# Patient Record
Sex: Male | Born: 1955 | Race: White | Hispanic: No | Marital: Married | Smoking: Former smoker
Health system: Southern US, Community
[De-identification: ages and names within clinical notes are randomized; demographics above are authoritative.]

## PROBLEM LIST (undated history)

## (undated) DIAGNOSIS — E7800 Pure hypercholesterolemia, unspecified: Secondary | ICD-10-CM

## (undated) DIAGNOSIS — K279 Peptic ulcer, site unspecified, unspecified as acute or chronic, without hemorrhage or perforation: Secondary | ICD-10-CM

## (undated) DIAGNOSIS — R51 Headache: Secondary | ICD-10-CM

## (undated) DIAGNOSIS — R011 Cardiac murmur, unspecified: Secondary | ICD-10-CM

## (undated) DIAGNOSIS — R7309 Other abnormal glucose: Secondary | ICD-10-CM

## (undated) DIAGNOSIS — M549 Dorsalgia, unspecified: Secondary | ICD-10-CM

## (undated) DIAGNOSIS — N529 Male erectile dysfunction, unspecified: Secondary | ICD-10-CM

## (undated) DIAGNOSIS — K219 Gastro-esophageal reflux disease without esophagitis: Secondary | ICD-10-CM

## (undated) DIAGNOSIS — Z87442 Personal history of urinary calculi: Secondary | ICD-10-CM

## (undated) DIAGNOSIS — R519 Headache, unspecified: Secondary | ICD-10-CM

## (undated) DIAGNOSIS — K7581 Nonalcoholic steatohepatitis (NASH): Secondary | ICD-10-CM

## (undated) DIAGNOSIS — G43909 Migraine, unspecified, not intractable, without status migrainosus: Secondary | ICD-10-CM

## (undated) DIAGNOSIS — G4730 Sleep apnea, unspecified: Secondary | ICD-10-CM

## (undated) DIAGNOSIS — I10 Essential (primary) hypertension: Secondary | ICD-10-CM

## (undated) DIAGNOSIS — J309 Allergic rhinitis, unspecified: Secondary | ICD-10-CM

## (undated) DIAGNOSIS — K7689 Other specified diseases of liver: Secondary | ICD-10-CM

## (undated) DIAGNOSIS — E291 Testicular hypofunction: Secondary | ICD-10-CM

## (undated) HISTORY — DX: Essential (primary) hypertension: I10

## (undated) HISTORY — DX: Sleep apnea, unspecified: G47.30

## (undated) HISTORY — DX: Gastro-esophageal reflux disease without esophagitis: K21.9

## (undated) HISTORY — DX: Nonalcoholic steatohepatitis (NASH): K75.81

## (undated) HISTORY — DX: Headache, unspecified: R51.9

## (undated) HISTORY — DX: Peptic ulcer, site unspecified, unspecified as acute or chronic, without hemorrhage or perforation: K27.9

## (undated) HISTORY — DX: Cardiac murmur, unspecified: R01.1

## (undated) HISTORY — DX: Other abnormal glucose: R73.09

## (undated) HISTORY — DX: Migraine, unspecified, not intractable, without status migrainosus: G43.909

## (undated) HISTORY — DX: Testicular hypofunction: E29.1

## (undated) HISTORY — DX: Other specified diseases of liver: K76.89

## (undated) HISTORY — PX: VASECTOMY: SHX75

## (undated) HISTORY — DX: Headache: R51

## (undated) HISTORY — DX: Dorsalgia, unspecified: M54.9

## (undated) HISTORY — PX: KNEE SURGERY: SHX244

## (undated) HISTORY — DX: Male erectile dysfunction, unspecified: N52.9

## (undated) HISTORY — DX: Allergic rhinitis, unspecified: J30.9

## (undated) HISTORY — PX: COLONOSCOPY: SHX174

---

## 2004-01-31 ENCOUNTER — Ambulatory Visit: Payer: Self-pay | Admitting: Endocrinology

## 2004-02-05 ENCOUNTER — Ambulatory Visit: Payer: Self-pay | Admitting: Endocrinology

## 2004-03-18 ENCOUNTER — Ambulatory Visit: Payer: Self-pay | Admitting: Cardiology

## 2004-06-11 ENCOUNTER — Ambulatory Visit: Payer: Self-pay | Admitting: Endocrinology

## 2004-06-30 ENCOUNTER — Ambulatory Visit: Payer: Self-pay | Admitting: Internal Medicine

## 2005-01-13 ENCOUNTER — Emergency Department (HOSPITAL_COMMUNITY): Admission: EM | Admit: 2005-01-13 | Discharge: 2005-01-13 | Payer: Self-pay | Admitting: Emergency Medicine

## 2005-01-21 ENCOUNTER — Ambulatory Visit: Payer: Self-pay | Admitting: Endocrinology

## 2005-01-26 ENCOUNTER — Ambulatory Visit: Payer: Self-pay | Admitting: Endocrinology

## 2005-03-06 ENCOUNTER — Encounter: Admission: RE | Admit: 2005-03-06 | Discharge: 2005-03-06 | Payer: Self-pay | Admitting: *Deleted

## 2005-06-22 ENCOUNTER — Ambulatory Visit: Payer: Self-pay | Admitting: Endocrinology

## 2005-07-01 ENCOUNTER — Ambulatory Visit: Payer: Self-pay | Admitting: Cardiology

## 2006-02-21 ENCOUNTER — Ambulatory Visit: Payer: Self-pay | Admitting: Endocrinology

## 2006-02-21 HISTORY — PX: ELECTROCARDIOGRAM: SHX264

## 2006-09-06 ENCOUNTER — Ambulatory Visit: Payer: Self-pay | Admitting: Gastroenterology

## 2006-09-19 ENCOUNTER — Ambulatory Visit: Payer: Self-pay | Admitting: Gastroenterology

## 2006-09-19 ENCOUNTER — Encounter: Payer: Self-pay | Admitting: Gastroenterology

## 2006-11-23 ENCOUNTER — Encounter: Payer: Self-pay | Admitting: Endocrinology

## 2006-11-23 DIAGNOSIS — I10 Essential (primary) hypertension: Secondary | ICD-10-CM | POA: Insufficient documentation

## 2006-11-23 DIAGNOSIS — J309 Allergic rhinitis, unspecified: Secondary | ICD-10-CM | POA: Insufficient documentation

## 2006-11-23 HISTORY — DX: Allergic rhinitis, unspecified: J30.9

## 2006-11-23 HISTORY — DX: Essential (primary) hypertension: I10

## 2007-04-03 ENCOUNTER — Ambulatory Visit: Payer: Self-pay | Admitting: Endocrinology

## 2007-04-03 LAB — CONVERTED CEMR LAB
Basophils Relative: 0.9 % (ref 0.0–1.0)
Bilirubin Urine: NEGATIVE
Bilirubin, Direct: 0.2 mg/dL (ref 0.0–0.3)
CO2: 31 meq/L (ref 19–32)
Calcium: 9.1 mg/dL (ref 8.4–10.5)
Cholesterol: 133 mg/dL (ref 0–200)
Eosinophils Absolute: 0.1 10*3/uL (ref 0.0–0.6)
Eosinophils Relative: 2.1 % (ref 0.0–5.0)
GFR calc Af Amer: 82 mL/min
GFR calc non Af Amer: 68 mL/min
Glucose, Bld: 113 mg/dL — ABNORMAL HIGH (ref 70–99)
Hemoglobin: 16.2 g/dL (ref 13.0–17.0)
Leukocytes, UA: NEGATIVE
Lymphocytes Relative: 38.3 % (ref 12.0–46.0)
MCV: 87.1 fL (ref 78.0–100.0)
Monocytes Absolute: 0.6 10*3/uL (ref 0.2–0.7)
Neutro Abs: 3.2 10*3/uL (ref 1.4–7.7)
Neutrophils Relative %: 49.9 % (ref 43.0–77.0)
Nitrite: NEGATIVE
PSA: 0.29 ng/mL (ref 0.10–4.00)
Platelets: 209 10*3/uL (ref 150–400)
Potassium: 4.1 meq/L (ref 3.5–5.1)
Sodium: 142 meq/L (ref 135–145)
TSH: 2.3 microintl units/mL (ref 0.35–5.50)
Total CHOL/HDL Ratio: 4.3
Total Protein: 6.5 g/dL (ref 6.0–8.3)
Triglycerides: 133 mg/dL (ref 0–149)
Urine Glucose: NEGATIVE mg/dL
WBC: 6.5 10*3/uL (ref 4.5–10.5)

## 2007-04-04 ENCOUNTER — Ambulatory Visit: Payer: Self-pay | Admitting: Endocrinology

## 2007-04-04 DIAGNOSIS — K219 Gastro-esophageal reflux disease without esophagitis: Secondary | ICD-10-CM | POA: Insufficient documentation

## 2007-04-04 DIAGNOSIS — R7309 Other abnormal glucose: Secondary | ICD-10-CM

## 2007-04-04 DIAGNOSIS — E119 Type 2 diabetes mellitus without complications: Secondary | ICD-10-CM | POA: Insufficient documentation

## 2007-04-04 DIAGNOSIS — N529 Male erectile dysfunction, unspecified: Secondary | ICD-10-CM

## 2007-04-04 HISTORY — DX: Gastro-esophageal reflux disease without esophagitis: K21.9

## 2007-04-04 HISTORY — DX: Male erectile dysfunction, unspecified: N52.9

## 2007-04-04 HISTORY — DX: Other abnormal glucose: R73.09

## 2007-05-19 ENCOUNTER — Ambulatory Visit: Payer: Self-pay | Admitting: Endocrinology

## 2007-05-19 DIAGNOSIS — E291 Testicular hypofunction: Secondary | ICD-10-CM

## 2007-05-19 HISTORY — DX: Testicular hypofunction: E29.1

## 2007-05-21 LAB — CONVERTED CEMR LAB: Prolactin: 11.2 ng/mL

## 2007-06-03 ENCOUNTER — Encounter: Admission: RE | Admit: 2007-06-03 | Discharge: 2007-06-03 | Payer: Self-pay | Admitting: Endocrinology

## 2007-06-29 ENCOUNTER — Encounter: Payer: Self-pay | Admitting: Endocrinology

## 2008-04-19 ENCOUNTER — Ambulatory Visit: Payer: Self-pay | Admitting: Endocrinology

## 2008-04-20 LAB — CONVERTED CEMR LAB
ALT: 32 units/L (ref 0–53)
AST: 21 units/L (ref 0–37)
Alkaline Phosphatase: 69 units/L (ref 39–117)
Basophils Absolute: 0 10*3/uL (ref 0.0–0.1)
CO2: 29 meq/L (ref 19–32)
Chloride: 106 meq/L (ref 96–112)
Cholesterol: 108 mg/dL (ref 0–200)
Eosinophils Absolute: 0.1 10*3/uL (ref 0.0–0.7)
Eosinophils Relative: 1 % (ref 0.0–5.0)
GFR calc non Af Amer: 68 mL/min
Hemoglobin, Urine: NEGATIVE
LDL Cholesterol: 54 mg/dL (ref 0–99)
Leukocytes, UA: NEGATIVE
Lymphocytes Relative: 22.8 % (ref 12.0–46.0)
MCHC: 35.1 g/dL (ref 30.0–36.0)
MCV: 87.1 fL (ref 78.0–100.0)
Neutrophils Relative %: 70.7 % (ref 43.0–77.0)
Platelets: 183 10*3/uL (ref 150–400)
Potassium: 4.2 meq/L (ref 3.5–5.1)
RBC: 5.36 M/uL (ref 4.22–5.81)
Specific Gravity, Urine: 1.015 (ref 1.000–1.03)
Total Bilirubin: 1.3 mg/dL — ABNORMAL HIGH (ref 0.3–1.2)
Total CHOL/HDL Ratio: 3.4
Urine Glucose: NEGATIVE mg/dL
Urobilinogen, UA: 0.2 (ref 0.0–1.0)
VLDL: 22 mg/dL (ref 0–40)
WBC: 9 10*3/uL (ref 4.5–10.5)

## 2008-05-02 ENCOUNTER — Telehealth (INDEPENDENT_AMBULATORY_CARE_PROVIDER_SITE_OTHER): Payer: Self-pay | Admitting: *Deleted

## 2008-05-09 ENCOUNTER — Ambulatory Visit: Payer: Self-pay | Admitting: Endocrinology

## 2008-05-09 DIAGNOSIS — K7689 Other specified diseases of liver: Secondary | ICD-10-CM

## 2008-05-09 DIAGNOSIS — K760 Fatty (change of) liver, not elsewhere classified: Secondary | ICD-10-CM | POA: Insufficient documentation

## 2008-05-09 HISTORY — DX: Other specified diseases of liver: K76.89

## 2008-06-13 ENCOUNTER — Telehealth (INDEPENDENT_AMBULATORY_CARE_PROVIDER_SITE_OTHER): Payer: Self-pay | Admitting: *Deleted

## 2008-09-06 ENCOUNTER — Telehealth: Payer: Self-pay | Admitting: Endocrinology

## 2009-06-09 ENCOUNTER — Ambulatory Visit: Payer: Self-pay | Admitting: Endocrinology

## 2009-06-09 LAB — CONVERTED CEMR LAB
AST: 27 units/L (ref 0–37)
Alkaline Phosphatase: 70 units/L (ref 39–117)
Basophils Relative: 0.3 % (ref 0.0–3.0)
Bilirubin, Direct: 0.2 mg/dL (ref 0.0–0.3)
CO2: 31 meq/L (ref 19–32)
Calcium: 9.6 mg/dL (ref 8.4–10.5)
Creatinine, Ser: 1.2 mg/dL (ref 0.4–1.5)
Eosinophils Absolute: 0.2 10*3/uL (ref 0.0–0.7)
GFR calc non Af Amer: 67.12 mL/min (ref >60)
HDL: 53.2 mg/dL (ref >39.00)
Hemoglobin, Urine: NEGATIVE
LDL Cholesterol: 70 mg/dL (ref 0–99)
Lymphocytes Relative: 32 % (ref 12.0–46.0)
MCHC: 33 g/dL (ref 30.0–36.0)
Monocytes Relative: 8.1 % (ref 3.0–12.0)
Neutrophils Relative %: 57.5 % (ref 43.0–77.0)
PSA: 0.37 ng/mL (ref 0.10–4.00)
RBC: 5.53 M/uL (ref 4.22–5.81)
Sodium: 145 meq/L (ref 135–145)
Specific Gravity, Urine: >=1.03 (ref 1.000–1.030)
Total CHOL/HDL Ratio: 3
Total Protein, Urine: NEGATIVE mg/dL
Total Protein: 7 g/dL (ref 6.0–8.3)
Triglycerides: 110 mg/dL (ref 0.0–149.0)
Urine Glucose: NEGATIVE mg/dL
VLDL: 22 mg/dL (ref 0.0–40.0)
WBC: 7.8 10*3/uL (ref 4.5–10.5)

## 2009-06-13 ENCOUNTER — Ambulatory Visit: Payer: Self-pay | Admitting: Endocrinology

## 2010-03-22 ENCOUNTER — Emergency Department (HOSPITAL_COMMUNITY)
Admission: EM | Admit: 2010-03-22 | Discharge: 2010-03-22 | Payer: Self-pay | Source: Home / Self Care | Admitting: Emergency Medicine

## 2010-04-01 ENCOUNTER — Ambulatory Visit
Admission: RE | Admit: 2010-04-01 | Discharge: 2010-04-01 | Payer: Self-pay | Source: Home / Self Care | Attending: Endocrinology | Admitting: Endocrinology

## 2010-04-01 DIAGNOSIS — J0120 Acute ethmoidal sinusitis, unspecified: Secondary | ICD-10-CM | POA: Insufficient documentation

## 2010-04-14 ENCOUNTER — Encounter: Payer: Self-pay | Admitting: Endocrinology

## 2010-04-26 LAB — CONVERTED CEMR LAB: Testosterone: 265.67 ng/dL — ABNORMAL LOW (ref 350.00–890)

## 2010-04-28 NOTE — Assessment & Plan Note (Signed)
Summary: PHYSICAL---STC   Vital Signs:  Patient profile:   55 year old male Height:      68.5 inches (173.99 cm) Weight:      230 pounds (104.55 kg) BMI:     34.59 O2 Sat:      96 % on Room air Temp:     97.1 degrees F (36.17 degrees C) oral Pulse rate:   62 / minute BP sitting:   128 / 80  (left arm) Cuff size:   large  Vitals Entered By: Josph Macho RMA (June 13, 2009 8:17 AM)  O2 Flow:  Room air CC: Physical/ CF   CC:  Physical/ CF.  History of Present Illness: here for regular wellness examination.  He's feeling pretty well in general, and does not smoke.  alcohol is rare.   Current Medications (verified): 1)  Mevacor 40 Mg  Tabs (Lovastatin) .... Take 2 By Mouth Qhs 2)  Adult Aspirin Low Strength 81 Mg  Tbdp (Aspirin) .... Take 1 By Mouth Qd 3)  Hyzaar 50-12.5 Mg  Tabs (Losartan Potassium-Hctz) .... Take 1 By Mouth Qd 4)  Midrin 325-65-100 Mg  Caps (Apap-Isometheptene-Dichloral) .... Take 1-2 Prn 5)  Voltaren 75 Mg  Tbec (Diclofenac Sodium) .... Take 1 By Mouth Two Times A Day Qd 6)  Cialis 20 Mg  Tabs (Tadalafil) .... For As Needed Use 7)  Prilosec 40 Mg  Cpdr (Omeprazole) .... Qd 8)  Clomid 50 Mg  Tabs (Clomiphene Citrate) .... 1/4 Tab Qd  Allergies (verified): No Known Drug Allergies  Past History:  Past Medical History: Last updated: 11/23/2006 Allergic rhinitis Hypertension Ulcers/PUD Migraines NASH  Family History: Reviewed history from 04/04/2007 and no changes required. 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         he has slight depression due to family  matters  Physical Exam  General:  obese.   Head:  head: no deformity eyes: no periorbital swelling, no proptosis external nose and ears are normal mouth: no lesion seen Neck:  Supple without thyroid enlargement or tenderness.  Lungs:  Clear to auscultation bilaterally. Normal respiratory effort.  Heart:  Regular rate and rhythm without murmurs or gallops noted. Normal S1,S2.   Abdomen:  abdomen is soft, nontender.  no hepatosplenomegaly.   not distended.  no hernia  Rectal:  normal external and internal exam.  heme neg  Prostate:  Normal size prostate without masses or tenderness.  Msk:  muscle bulk and strength are grossly normal.  no obvious joint swelling.  gait is normal and steady  Pulses:  dorsalis pedis intact bilat.  no carotid bruit  Extremities:  no deformity.  no ulcer on the feet.  feet are of normal color and temp.  no edema  Neurologic:  cn 2-12 grossly intact.   readily moves all 4's.   sensation is intact to touch on the feet  Skin:  normal texture and temp.  no rash.  not diaphoretic  Cervical Nodes:  No significant adenopathy.  Psych:  Alert and cooperative; normal mood and affect; normal attention span and concentration.     Impression & Recommendations:  Problem # 1:  ROUTINE GENERAL MEDICAL EXAM@HEALTH  CARE FACL (  ICD-V70.0)  Other Orders: EKG w/ Interpretation (93000) TLB-A1C / Hgb A1C (Glycohemoglobin) (83036-A1C) TLB-Testosterone, Total (84403-TESTO) Est. Patient 40-64 years (16109)   Patient Instructions: 1)  tests are being ordered for you today.  a few days after the test(s), please call [phone] to hear your test results. 2)  here are some samples of benicar-hct 20/12.5, to take 1 per day, instead of hyzaar.  when you run out of these, resume hyzaar. 3)  Please schedule a follow-up appointment in 1 year. 4)  we discussed the recommendations of the preventive services task force 5)  (pt probably needs to resume clomid)

## 2010-04-30 NOTE — Consult Note (Signed)
Summary: [facility]  [facility]   Imported By: Lennie Odor 04/17/2010 15:13:18  _____________________________________________________________________  External Attachment:    Type:   Image     Comment:   External Document

## 2010-04-30 NOTE — Assessment & Plan Note (Signed)
Summary: POST HOSP/NWS   Vital Signs:  Patient profile:   55 year old male Height:      68.5 inches (173.99 cm) Weight:      232.13 pounds (105.51 kg) BMI:     34.91 O2 Sat:      92 % on Room air Temp:     98.7 degrees F (37.06 degrees C) oral Pulse rate:   92 / minute BP sitting:   110 / 68  (left arm) Cuff size:   large  Vitals Entered By: Brenton Grills CMA Duncan Dull) (April 01, 2010 11:17 AM)  O2 Flow:  Room air CC: [facility] F/U/referral to ENT/aj Is Patient Diabetic? No Comments Pt is no longer taking Midrin, Prilosec, Clomid, or Voltaren   CC:  Post Hospital F/U/referral to ENT/aj.  History of Present Illness: pt was seen in er last week for sinusitis, and vomiting due to narcotic.  maxillary pain is improved, but intermittently recurs.    Current Medications (verified): 1)  Mevacor 40 Mg  Tabs (Lovastatin) .... Take 2 By Mouth Qhs 2)  Adult Aspirin Low Strength 81 Mg  Tbdp (Aspirin) .... Take 1 By Mouth Qd 3)  Hyzaar 50-12.5 Mg  Tabs (Losartan Potassium-Hctz) .... Take 1 By Mouth Qd 4)  Midrin 325-65-100 Mg  Caps (Apap-Isometheptene-Dichloral) .... Take 1-2 Prn 5)  Voltaren 75 Mg  Tbec (Diclofenac Sodium) .... Take 1 By Mouth Two Times A Day Qd 6)  Cialis 20 Mg  Tabs (Tadalafil) .... For As Needed Use 7)  Prilosec 40 Mg  Cpdr (Omeprazole) .... Qd 8)  Clomid 50 Mg  Tabs (Clomiphene Citrate) .... 1/4 Tab Qd  Allergies: No Known Drug Allergies  Past History:  Past Medical History: Last updated: 11/23/2006 Allergic rhinitis Hypertension Ulcers/PUD Migraines NASH  Physical Exam  Head:  head: no deformity eyes: no periorbital swelling, no proptosis external nose and ears are normal mouth: no lesion seen Ears:  TM's intact and clear with normal canals with grossly normal hearing.     Impression & Recommendations:  Problem # 1:  ACUTE ETHMOIDAL SINUSITIS (ICD-461.2) Assessment Unchanged  Problem # 2:  HYPERTENSION  (ICD-401.9) well-controlled  Other Orders: ENT Referral (ENT) Est. Patient Level III (62952)  Patient Instructions: 1)  refer to ear/nose/throat specialist.  you will be called with a day and time for an appointment. 2)  same medication for blood pressure.   Orders Added: 1)  ENT Referral [ENT] 2)  Est. Patient Level III [84132]

## 2010-06-08 LAB — CBC
HCT: 44.9 % (ref 39.0–52.0)
Hemoglobin: 15.8 g/dL (ref 13.0–17.0)
WBC: 9.8 10*3/uL (ref 4.0–10.5)

## 2010-06-08 LAB — CK TOTAL AND CKMB (NOT AT ARMC)
CK, MB: 3.3 ng/mL (ref 0.3–4.0)
Total CK: 160 U/L (ref 7–232)

## 2010-06-08 LAB — DIFFERENTIAL
Basophils Relative: 0 % (ref 0–1)
Eosinophils Absolute: 0.1 10*3/uL (ref 0.0–0.7)
Monocytes Relative: 5 % (ref 3–12)
Neutrophils Relative %: 83 % — ABNORMAL HIGH (ref 43–77)

## 2010-06-08 LAB — COMPREHENSIVE METABOLIC PANEL
ALT: 30 U/L (ref 0–53)
Alkaline Phosphatase: 67 U/L (ref 39–117)
CO2: 28 mEq/L (ref 19–32)
GFR calc non Af Amer: >60 mL/min (ref >60)
Glucose, Bld: 110 mg/dL — ABNORMAL HIGH (ref 70–99)
Potassium: 3.7 mEq/L (ref 3.5–5.1)
Sodium: 140 mEq/L (ref 135–145)

## 2010-06-08 LAB — POCT CARDIAC MARKERS: Myoglobin, poc: 151 ng/mL (ref 12–200)

## 2010-09-14 ENCOUNTER — Other Ambulatory Visit: Payer: Self-pay | Admitting: Endocrinology

## 2010-11-13 ENCOUNTER — Emergency Department (HOSPITAL_COMMUNITY)
Admission: EM | Admit: 2010-11-13 | Discharge: 2010-11-13 | Disposition: A | Discharge status: Home / Self Care | Payer: 59 | Attending: Emergency Medicine | Admitting: Emergency Medicine

## 2010-11-13 ENCOUNTER — Emergency Department (HOSPITAL_COMMUNITY): Payer: 59

## 2010-11-13 DIAGNOSIS — IMO0002 Reserved for concepts with insufficient information to code with codable children: Secondary | ICD-10-CM | POA: Insufficient documentation

## 2010-11-13 DIAGNOSIS — M25469 Effusion, unspecified knee: Secondary | ICD-10-CM | POA: Insufficient documentation

## 2010-11-13 DIAGNOSIS — I10 Essential (primary) hypertension: Secondary | ICD-10-CM | POA: Insufficient documentation

## 2010-11-13 DIAGNOSIS — M25569 Pain in unspecified knee: Secondary | ICD-10-CM | POA: Insufficient documentation

## 2010-11-13 DIAGNOSIS — X500XXA Overexertion from strenuous movement or load, initial encounter: Secondary | ICD-10-CM | POA: Insufficient documentation

## 2010-11-13 DIAGNOSIS — E785 Hyperlipidemia, unspecified: Secondary | ICD-10-CM | POA: Insufficient documentation

## 2010-11-13 DIAGNOSIS — Z79899 Other long term (current) drug therapy: Secondary | ICD-10-CM | POA: Insufficient documentation

## 2010-11-13 DIAGNOSIS — Y9302 Activity, running: Secondary | ICD-10-CM | POA: Insufficient documentation

## 2010-11-19 ENCOUNTER — Other Ambulatory Visit: Payer: Self-pay | Admitting: Endocrinology

## 2011-01-15 ENCOUNTER — Other Ambulatory Visit: Payer: Self-pay

## 2011-01-15 MED ORDER — TADALAFIL 10 MG PO TABS: 10.0000 mg | ORAL_TABLET | Freq: Every day | ORAL | Status: DC | PRN

## 2011-01-15 NOTE — Telephone Encounter (Signed)
Pt's spouse aware.

## 2011-01-29 ENCOUNTER — Ambulatory Visit (INDEPENDENT_AMBULATORY_CARE_PROVIDER_SITE_OTHER): Payer: 59 | Admitting: Endocrinology

## 2011-01-29 ENCOUNTER — Encounter: Payer: Self-pay | Admitting: Endocrinology

## 2011-01-29 VITALS — BP 132/76 | HR 93 | Temp 98.6°F | Ht 69.0 in | Wt 237.2 lb

## 2011-01-29 DIAGNOSIS — J069 Acute upper respiratory infection, unspecified: Secondary | ICD-10-CM

## 2011-01-29 DIAGNOSIS — Z23 Encounter for immunization: Secondary | ICD-10-CM

## 2011-01-29 DIAGNOSIS — R7309 Other abnormal glucose: Secondary | ICD-10-CM

## 2011-01-29 MED ORDER — BENZONATATE 200 MG PO CAPS: 200.0000 mg | ORAL_CAPSULE | Freq: Three times a day (TID) | ORAL | Status: AC | PRN

## 2011-01-29 MED ORDER — AZITHROMYCIN 500 MG PO TABS: 500.0000 mg | ORAL_TABLET | Freq: Every day | ORAL | Status: AC

## 2011-01-29 NOTE — Progress Notes (Signed)
  Subjective:    Patient ID: Ruben West, male    DOB: [birth year], 55 y.o.   MRN: [id number]  HPI Pt states a few days of prod-quality cough in the chest, rhinorrhea, and assoc nasal congestion No past medical history on file.  No past surgical history on file.  History   Social History  . Marital Status: Married    Spouse Name: N/A    Number of Children: N/A  . Years of Education: N/A   Occupational History  . Not on file.   Social History Main Topics  . Smoking status: Never Smoker   . Smokeless tobacco: Not on file  . Alcohol Use: Not on file  . Drug Use: Not on file  . Sexually Active: Not on file   Other Topics Concern  . Not on file   Social History Narrative  . No narrative on file    Current Outpatient Prescriptions on File Prior to Visit  Medication Sig Dispense Refill  . losartan-hydrochlorothiazide (HYZAAR) 50-12.5 MG per tablet TAKE 1 TABLET BY MOUTH EVERY DAY  90 tablet  1  . lovastatin (MEVACOR) 40 MG tablet TAKE 2 BY MOUTH EVERY DAY AT BEDTIME  180 tablet  1  . tadalafil (CIALIS) 10 MG tablet Take 1 tablet (10 mg total) by mouth daily as needed for erectile dysfunction.  10 tablet  0   No Known Allergies  No family history on file.  BP 132/76  Pulse 93  Temp(Src) 98.6 F (37 C) (Oral)  Ht 5\' 9"  (1.753 m)  Wt 237 lb 3.2 oz (107.593 kg)  BMI 35.03 kg/m2  SpO2 96%  Review of Systems denies wheezing and fever    Objective:   Physical Exam VITAL SIGNS:  See vs page GENERAL: no distress  head: no deformity eyes: no periorbital swelling, no proptosis external nose and ears are normal mouth: no lesion seen Both eac's are normal .  Both tm's are slightly red and with fluid bilaterally.        Assessment & Plan:  Glenford Peers, [place]

## 2011-01-29 NOTE — Patient Instructions (Addendum)
i have sent a prescription to your pharmacy, for an antibiotic, and for non-drowsy cough pills.   Loratadine-d (non-prescription) will help your congestion.  I hope you feel better soon.  If you don't feel better by next week, please call back.

## 2011-03-27 ENCOUNTER — Emergency Department (HOSPITAL_COMMUNITY): Payer: Self-pay

## 2011-03-27 ENCOUNTER — Emergency Department (HOSPITAL_COMMUNITY)
Admission: EM | Admit: 2011-03-27 | Discharge: 2011-03-27 | Disposition: A | Discharge status: Home / Self Care | Payer: Self-pay | Attending: Emergency Medicine | Admitting: Emergency Medicine

## 2011-03-27 ENCOUNTER — Encounter (HOSPITAL_COMMUNITY): Payer: Self-pay | Admitting: Emergency Medicine

## 2011-03-27 DIAGNOSIS — E7800 Pure hypercholesterolemia, unspecified: Secondary | ICD-10-CM | POA: Insufficient documentation

## 2011-03-27 DIAGNOSIS — B9789 Other viral agents as the cause of diseases classified elsewhere: Secondary | ICD-10-CM | POA: Insufficient documentation

## 2011-03-27 DIAGNOSIS — I10 Essential (primary) hypertension: Secondary | ICD-10-CM | POA: Insufficient documentation

## 2011-03-27 DIAGNOSIS — R059 Cough, unspecified: Secondary | ICD-10-CM | POA: Insufficient documentation

## 2011-03-27 DIAGNOSIS — R05 Cough: Secondary | ICD-10-CM | POA: Insufficient documentation

## 2011-03-27 DIAGNOSIS — R0602 Shortness of breath: Secondary | ICD-10-CM | POA: Insufficient documentation

## 2011-03-27 DIAGNOSIS — J988 Other specified respiratory disorders: Secondary | ICD-10-CM

## 2011-03-27 DIAGNOSIS — R509 Fever, unspecified: Secondary | ICD-10-CM | POA: Insufficient documentation

## 2011-03-27 HISTORY — DX: Pure hypercholesterolemia, unspecified: E78.00

## 2011-03-27 MED ORDER — ALBUTEROL SULFATE HFA 108 (90 BASE) MCG/ACT IN AERS: 2.0000 | INHALATION_SPRAY | RESPIRATORY_TRACT | Status: DC | PRN

## 2011-03-27 MED ORDER — BENZONATATE 100 MG PO CAPS: 100.0000 mg | ORAL_CAPSULE | Freq: Three times a day (TID) | ORAL | Status: AC | PRN

## 2011-03-27 NOTE — ED Notes (Signed)
PT. REPORTS PRODUCTIVE COUGH WITH SOB , CHEST CONGESTION  , NAUSEA AND CHILLS ONSET LAST NIGHT .

## 2011-03-27 NOTE — ED Provider Notes (Signed)
History     CSN: [id number]  Arrival date & time 03/27/11  5621   First MD Initiated Contact with Patient 03/27/11 [phone]      Chief Complaint  Patient presents with  . Cough    (Consider location/radiation/quality/duration/timing/severity/associated sxs/prior treatment) HPI Comments: Patient reports subjective fever, cough productive of yellow/green sputum, chest and back soreness, body aches.  SOB only after long coughing spells.  Patient has had recent sick contacts - wife and coworker.  Did get flu shot this year.  Pt does not smoke.    Patient is a 55 y.o. male presenting with cough. The history is provided by the patient.  Cough    Past Medical History  Diagnosis Date  . Hypertension   . Hypercholesteremia     Past Surgical History  Procedure Date  . Vasectomy     No family history on file.  History  Substance Use Topics  . Smoking status: Never Smoker   . Smokeless tobacco: Not on file  . Alcohol Use: No      Review of Systems  Respiratory: Positive for cough.   All other systems reviewed and are negative.    Allergies  Oxycodone and Vicodin  Home Medications   Current Outpatient Rx  Name Route Sig Dispense Refill  . BENZONATATE 100 MG PO CAPS Oral Take 100 mg by mouth 3 (three) times daily as needed. cough     . LOSARTAN POTASSIUM-HCTZ 50-12.5 MG PO TABS  TAKE 1 TABLET BY MOUTH EVERY DAY 90 tablet 1  . LOVASTATIN 40 MG PO TABS  TAKE 2 BY MOUTH EVERY DAY AT BEDTIME 180 tablet 1  . TADALAFIL 10 MG PO TABS Oral Take 10 mg by mouth daily as needed.        BP 126/89  Pulse 87  Temp(Src) 98.3 F (36.8 C) (Oral)  Resp 18  SpO2 96%  Physical Exam  Nursing note and vitals reviewed. Constitutional: He is oriented to person, place, and time. He appears well-developed and well-nourished.  HENT:  Head: Normocephalic and atraumatic.  Mouth/Throat: Oropharynx is clear and moist.  Neck: Neck supple.  Cardiovascular: Normal rate, regular rhythm and  normal heart sounds.   Pulmonary/Chest: Effort normal and breath sounds normal. No respiratory distress. He has no wheezes. He has no rales. He exhibits no tenderness.       +cough  Abdominal: Soft. Bowel sounds are normal. There is no tenderness.  Neurological: He is alert and oriented to person, place, and time.    ED Course  Procedures (including critical care time)  Labs Reviewed - No data to display Dg Chest 2 View  03/27/2011  *RADIOLOGY REPORT*  Clinical Data: Cough and shortness of breath  CHEST - 2 VIEW  Comparison: None.  Findings: The heart, mediastinal, and hilar contours are normal. The lungs are well-expanded and clear. Negative for pleural effusion. The bony thorax is unremarkable.  IMPRESSION: No acute cardiopulmonary disease.  Original Report Authenticated By: Britta Mccreedy, M.D.     1. Viral respiratory illness       MDM  Patient with 2 days if cough, body aches, and subjective fever, +sick contacts, CXR normal.  Pt in no distress, lungs CTAB.  D/c home with symptomatic treatment.          Dillard Cannon [place], [place] 03/27/11 [phone]

## 2011-03-27 NOTE — ED Notes (Signed)
Patient transported to X-ray 

## 2011-03-27 NOTE — ED Provider Notes (Signed)
Medical screening examination/treatment/procedure(s) were performed by non-physician practitioner and as supervising physician I was immediately available for consultation/collaboration.   Lynnsey Barbara A. Patrica Duel, MD 03/27/11 1401

## 2011-04-25 ENCOUNTER — Other Ambulatory Visit: Payer: Self-pay | Admitting: Endocrinology

## 2011-05-03 ENCOUNTER — Other Ambulatory Visit: Payer: Self-pay | Admitting: Endocrinology

## 2011-05-03 MED ORDER — LOSARTAN POTASSIUM-HCTZ 50-12.5 MG PO TABS: 1.0000 | ORAL_TABLET | Freq: Every day | ORAL | Status: DC

## 2011-05-31 ENCOUNTER — Encounter: Payer: Self-pay | Admitting: Endocrinology

## 2011-06-21 ENCOUNTER — Encounter: Payer: Self-pay | Admitting: Endocrinology

## 2011-07-12 ENCOUNTER — Encounter: Payer: Self-pay | Admitting: Endocrinology

## 2011-07-12 ENCOUNTER — Ambulatory Visit (INDEPENDENT_AMBULATORY_CARE_PROVIDER_SITE_OTHER): Payer: BC Managed Care – PPO | Admitting: Endocrinology

## 2011-07-12 ENCOUNTER — Other Ambulatory Visit (INDEPENDENT_AMBULATORY_CARE_PROVIDER_SITE_OTHER): Payer: BC Managed Care – PPO

## 2011-07-12 VITALS — BP 112/78 | HR 68 | Temp 97.2°F | Ht 68.0 in | Wt 219.0 lb

## 2011-07-12 DIAGNOSIS — R7309 Other abnormal glucose: Secondary | ICD-10-CM

## 2011-07-12 DIAGNOSIS — Z125 Encounter for screening for malignant neoplasm of prostate: Secondary | ICD-10-CM

## 2011-07-12 DIAGNOSIS — Z79899 Other long term (current) drug therapy: Secondary | ICD-10-CM

## 2011-07-12 DIAGNOSIS — E291 Testicular hypofunction: Secondary | ICD-10-CM

## 2011-07-12 DIAGNOSIS — K7689 Other specified diseases of liver: Secondary | ICD-10-CM

## 2011-07-12 DIAGNOSIS — I10 Essential (primary) hypertension: Secondary | ICD-10-CM

## 2011-07-12 LAB — URINALYSIS, ROUTINE W REFLEX MICROSCOPIC
Hgb urine dipstick: NEGATIVE
Urine Glucose: NEGATIVE
Urobilinogen, UA: 0.2 (ref 0.0–1.0)
WBC, UA: NONE SEEN (ref 0–?)

## 2011-07-12 LAB — BASIC METABOLIC PANEL
GFR: 85.14 mL/min (ref >60.00)
Glucose, Bld: 100 mg/dL — ABNORMAL HIGH (ref 70–99)
Potassium: 4.9 mEq/L (ref 3.5–5.1)
Sodium: 143 mEq/L (ref 135–145)

## 2011-07-12 LAB — LIPID PANEL
LDL Cholesterol: 59 mg/dL (ref 0–99)
VLDL: 14.2 mg/dL (ref 0.0–40.0)

## 2011-07-12 LAB — HEPATIC FUNCTION PANEL
ALT: 31 U/L (ref 0–53)
AST: 25 U/L (ref 0–37)
Alkaline Phosphatase: 63 U/L (ref 39–117)
Bilirubin, Direct: 0.2 mg/dL (ref 0.0–0.3)
Total Protein: 6.8 g/dL (ref 6.0–8.3)

## 2011-07-12 LAB — CBC WITH DIFFERENTIAL/PLATELET
Basophils Absolute: 0 10*3/uL (ref 0.0–0.1)
Basophils Relative: 0.7 % (ref 0.0–3.0)
Eosinophils Absolute: 0.1 10*3/uL (ref 0.0–0.7)
MCHC: 34.1 g/dL (ref 30.0–36.0)
MCV: 88.7 fl (ref 78.0–100.0)
Monocytes Absolute: 0.5 10*3/uL (ref 0.1–1.0)
Neutrophils Relative %: 58.5 % (ref 43.0–77.0)
Platelets: 216 10*3/uL (ref 150.0–400.0)
RDW: 12.9 % (ref 11.5–14.6)

## 2011-07-12 LAB — TESTOSTERONE: Testosterone: 314.7 ng/dL — ABNORMAL LOW (ref 350.00–890.00)

## 2011-07-12 MED ORDER — TADALAFIL 10 MG PO TABS: 10.0000 mg | ORAL_TABLET | ORAL | Status: DC | PRN

## 2011-07-12 MED ORDER — LOSARTAN POTASSIUM-HCTZ 50-12.5 MG PO TABS: 1.0000 | ORAL_TABLET | Freq: Every day | ORAL | Status: DC

## 2011-07-12 MED ORDER — LOVASTATIN 40 MG PO TABS: 40.0000 mg | ORAL_TABLET | Freq: Every day | ORAL | Status: DC

## 2011-07-12 MED ORDER — CLOMIPHENE CITRATE 50 MG PO TABS: ORAL_TABLET | ORAL | Status: DC

## 2011-07-12 NOTE — Progress Notes (Signed)
Subjective:    Patient ID: Ruben West, male    DOB: [birth year], 56 y.o.   MRN: [id number]  HPI here for regular wellness examination.  He's feeling pretty well in general, and says chronic med probs are stable, except as noted below.  He has not recently been on on clomid Past Medical History  Diagnosis Date  . Hypercholesteremia   . ALLERGIC RHINITIS 11/23/2006  . HYPERTENSION 11/23/2006  . HYPERGLYCEMIA 04/04/2007  . GERD 04/04/2007  . FATTY LIVER DISEASE 05/09/2008  . 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 Migraines   . NASH (nonalcoholic steatohepatitis)   . PUD (peptic ulcer disease)     Past Surgical History  Procedure Date  . Vasectomy   . Electrocardiogram 02/21/2006    History   Social History  . Marital Status: Married    Spouse Name: N/A    Number of Children: N/A  . 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 Smoking status: Never Smoker   . Smokeless tobacco: Not on file  . Alcohol Use: No  . Drug Use: No  . Sexually Active: Not on file   Other Topics Concern  . Not on file   Social History Narrative  . No narrative on file    Current Outpatient Prescriptions on File Prior to Visit  Medication Sig Dispense Refill  . aspirin 81 MG tablet Take 81 mg by mouth daily.      Marland Kitchen losartan-hydrochlorothiazide (HYZAAR) 50-12.5 MG per tablet Take 1 tablet by mouth daily.  90 tablet  3  . lovastatin (MEVACOR) 40 MG tablet Take 1 tablet (40 mg total) by mouth at bedtime.  90 tablet  3  . tadalafil (CIALIS) 10 MG tablet Take 1 tablet (10 mg total) by mouth daily as needed for erectile dysfunction.  10 tablet  0    Allergies  Allergen Reactions  . Oxycodone Nausea And Vomiting  . Vicodin (Hydrocodone-Acetaminophen)     headache    Family History  Problem Relation Age of Onset  . Heart attack Mother     MI  . Cancer Neg Hx     no cancer in immediate family     BP 112/78  Pulse 68  Temp(Src) 97.2 F (36.2 C) (Oral)  Ht 5\' 8"  (1.727 m)  Wt 219 lb (99.338 kg)  BMI 33.30 kg/m2  SpO2 97%    Review of Systems  Constitutional: Negative for fever.       He has lost weight, due to his efforts  HENT: Negative for hearing loss.   Eyes: Negative for visual disturbance.  Respiratory: Negative for shortness of breath.   Cardiovascular: Negative for chest pain.  Gastrointestinal: Negative for anal bleeding.  Genitourinary: Negative for hematuria and difficulty urinating.  Musculoskeletal: Negative for back pain.  Skin: Negative for rash.  Neurological: Negative for syncope and numbness.  Hematological: Does not bruise/bleed easily.  Psychiatric/Behavioral: Negative for dysphoric mood.       Objective:   Physical Exam VS: see vs page GEN: no distress HEAD: head: no deformity eyes: no periorbital swelling, no proptosis external nose and ears are normal mouth: no lesion seen NECK: supple, thyroid is not enlarged CHEST WALL: no deformity LUNGS: clear to auscultation BREASTS:  pseudogynecomastia CV: reg rate and rhythm, no murmur ABD: abdomen is soft, nontender.  no hepatosplenomegaly.  not distended.  no hernia GENITALIA:  Normal male.  RECTAL: normal external and internal exam.  heme neg. PROSTATE:  Normal size.  No nodule MUSCULOSKELETAL: muscle bulk and strength are grossly normal.  no obvious joint swelling.  gait is normal and steady EXTEMITIES: no deformity.  no ulcer on the feet.  feet are of normal color and temp.  no edema PULSES: dorsalis pedis intact bilat.  no carotid bruit NEURO:  cn 2-12 grossly intact.   readily moves all 4's.  sensation is intact to touch on the feet SKIN:  Normal texture and temperature.  No rash or suspicious lesion is visible.   NODES:  None palpable at the neck PSYCH: alert, oriented x3.  Does not appear anxious nor depressed.     Assessment & Plan:  Wellness visit today, with problems stable,  except as noted. Clomid is resumed

## 2011-07-12 NOTE — Patient Instructions (Addendum)
blood tests are being requested for you today.  You will receive a letter with results. please consider these measures for your health:  minimize alcohol.  do not use tobacco products.  have a colonoscopy at least every 10 years from age 56.  keep firearms safely stored.  always use seat belts.  have working smoke alarms in your home.  see an eye doctor and dentist regularly.  never drive under the influence of alcohol or drugs (including prescription drugs).  those with fair skin should take precautions against the sun.   Please return in 1 year.

## 2011-07-13 ENCOUNTER — Telehealth: Payer: Self-pay | Admitting: *Deleted

## 2011-07-13 DIAGNOSIS — Z0279 Encounter for issue of other medical certificate: Secondary | ICD-10-CM

## 2011-07-13 NOTE — Telephone Encounter (Signed)
Pt's spouse informed of lab results. 

## 2011-07-13 NOTE — Telephone Encounter (Signed)
Called pt to inform of lab results, no answer/unable to leave message. (Letter also mailed to pt)

## 2011-07-14 ENCOUNTER — Telehealth: Payer: Self-pay | Admitting: *Deleted

## 2011-07-14 NOTE — Telephone Encounter (Signed)
R'cd PA for Cialis-PA form faxed 07/14/2011, awaiting decision from insurance company.

## 2011-07-14 NOTE — Telephone Encounter (Signed)
PA approved 06/23/2011-07/13/2012.

## 2011-07-26 ENCOUNTER — Encounter: Payer: Self-pay | Admitting: Gastroenterology

## 2011-10-30 ENCOUNTER — Ambulatory Visit (INDEPENDENT_AMBULATORY_CARE_PROVIDER_SITE_OTHER): Payer: BC Managed Care – PPO | Admitting: Family Medicine

## 2011-10-30 ENCOUNTER — Ambulatory Visit: Payer: BC Managed Care – PPO

## 2011-10-30 VITALS — BP 149/77 | HR 93 | Temp 101.3°F | Resp 16 | Ht 69.0 in | Wt 224.0 lb

## 2011-10-30 DIAGNOSIS — R509 Fever, unspecified: Secondary | ICD-10-CM

## 2011-10-30 DIAGNOSIS — R05 Cough: Secondary | ICD-10-CM

## 2011-10-30 DIAGNOSIS — J40 Bronchitis, not specified as acute or chronic: Secondary | ICD-10-CM

## 2011-10-30 DIAGNOSIS — R059 Cough, unspecified: Secondary | ICD-10-CM

## 2011-10-30 LAB — POCT CBC
Granulocyte percent: 74.4 %G (ref 37–80)
HCT, POC: 48 % (ref 43.5–53.7)
Hemoglobin: 15.1 g/dL (ref 14.1–18.1)
Lymph, poc: 2 (ref 0.6–3.4)
MCH, POC: 29.3 pg (ref 27–31.2)
MCHC: 31.5 g/dL — AB (ref 31.8–35.4)
MCV: 93.3 fL (ref 80–97)
MID (cbc): 0.9 (ref 0–0.9)
MPV: 8.8 fL (ref 0–99.8)
POC Granulocyte: 8.3 — AB (ref 2–6.9)
POC LYMPH PERCENT: 17.6 %L (ref 10–50)
POC MID %: 8 %M (ref 0–12)
Platelet Count, POC: 195 10*3/uL (ref 142–424)
RBC: 5.15 M/uL (ref 4.69–6.13)
RDW, POC: 13.3 %
WBC: 11.1 10*3/uL — AB (ref 4.6–10.2)

## 2011-10-30 MED ORDER — AZITHROMYCIN 250 MG PO TABS: ORAL_TABLET | ORAL | Status: AC

## 2011-10-30 NOTE — Progress Notes (Signed)
@[email or web address] @   Patient ID: ISSAIH KAUS MRN: [id number], DOB: Aug 28, 1955, 56 y.o. Date of Encounter: 10/30/2011, 4:03 PM  Primary Physician: Romero Belling, MD  Chief Complaint:  Chief Complaint  Patient presents with  . Cough    1am Friday am.. productive cough usually green   . Fever    since Friday night  . Generalized Body Aches    HPI: 56 y.o. year old male presents with a 2 day history of nasal congestion, post nasal drip, sore throat, and cough. Mild sinus pressure. Afebrile. No chills. Nasal congestion thick and green/yellow. Cough is productive of green/yellow sputum and not associated with time of day. Ears feel full, leading to sensation of muffled hearing. Has tried OTC cold preps without success. No GI complaints. Appetite poor  No sick contacts, recent antibiotics, or recent travels.   No leg trauma, sedentary periods, h/o cancer, or tobacco use.  Past Medical History  Diagnosis Date  . Hypercholesteremia   . ALLERGIC RHINITIS 11/23/2006  . HYPERTENSION 11/23/2006  . HYPERGLYCEMIA 04/04/2007  . GERD 04/04/2007  . FATTY LIVER DISEASE 05/09/2008  . 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 Migraines   . NASH (nonalcoholic steatohepatitis)   . PUD (peptic ulcer disease)      Home Meds: Prior to Admission medications   Medication Sig Start Date End Date Taking? Authorizing Provider  aspirin 81 MG tablet Take 81 mg by mouth daily.   Yes Historical Provider, MD  losartan-hydrochlorothiazide (HYZAAR) 50-12.5 MG per tablet Take 1 tablet by mouth daily. 07/12/11  Yes Romero Belling, MD  lovastatin (MEVACOR) 40 MG tablet Take 1 tablet (40 mg total) by mouth at bedtime. 07/12/11  Yes Romero Belling, MD  tadalafil (CIALIS) 10 MG tablet Take 1 tablet (10 mg total) by mouth as needed for erectile dysfunction. 07/12/11  Yes Romero Belling, MD  clomiPHENE (CLOMID) 50 MG tablet 1/4 tablet by mouth daily 07/12/11   Romero Belling, MD  tadalafil (CIALIS)  10 MG tablet Take 1 tablet (10 mg total) by mouth daily as needed for erectile dysfunction. 01/15/11 02/14/11  Romero Belling, MD    Allergies:  Allergies  Allergen Reactions  . Oxycodone Nausea And Vomiting  . Vicodin (Hydrocodone-Acetaminophen)     headache    History   Social History  . Marital Status: Married    Spouse Name: N/A    Number of Children: N/A  . 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 Smoking status: Never Smoker   . Smokeless tobacco: Not on file  . Alcohol Use: No  . Drug Use: No  . Sexually Active: Not on file   Other Topics Concern  . Not on file   Social History Narrative  . No narrative on file     Review of Systems: Constitutional: negative for chills, fever, night sweats or weight changes Cardiovascular: negative for chest pain or palpitations Respiratory: negative for hemoptysis, wheezing, or shortness of breath Abdominal: negative for abdominal pain, nausea, vomiting or diarrhea Dermatological: negative for rash Neurologic: negative for headache   Physical Exam:  Blood pressure 149/77, pulse 93, temperature 101.3 F (38.5 C), temperature source Oral, resp. rate 16, height 5\' 9"  (1.753 m), weight 224 lb (101.606 kg), SpO2 99.00%., Body mass index is 33.08 kg/(m^2). General: Well developed, well nourished, in no acute distress. Head: Normocephalic, atraumatic, eyes without discharge, sclera non-icteric, nares are congested. Bilateral auditory canals  clear, TM's are without perforation, pearly grey with reflective cone of light bilaterally. No sinus TTP. Oral cavity moist, dentition normal. Posterior pharynx with post nasal drip and mild erythema. No peritonsillar abscess or tonsillar exudate. Neck: Supple. No thyromegaly. Full ROM. No lymphadenopathy. Lungs: Coarse breath sounds bilaterally without wheezes, rales, or rhonchi. Breathing is unlabored.  Heart: RRR with S1 S2. No murmurs, rubs,  or gallops appreciated. Msk:  Strength and tone normal for age. Extremities: No clubbing or cyanosis. No edema. Neuro: Alert and oriented X 3. Moves all extremities spontaneously. CNII-XII grossly in tact. Psych:  Responds to questions appropriately with a normal affect.   Labs: Results for orders placed in visit on 10/30/11  POCT CBC      Component Value Range   WBC 11.1 (*) 4.6 - 10.2 K/uL   Lymph, poc 2.0  0.6 - 3.4   POC LYMPH PERCENT 17.6  10 - 50 %L   MID (cbc) 0.9  0 - 0.9   POC MID % 8.0  0 - 12 %M   POC Granulocyte 8.3 (*) 2 - 6.9   Granulocyte percent 74.4  37 - 80 %G   RBC 5.15  4.69 - 6.13 M/uL   Hemoglobin 15.1  14.1 - 18.1 g/dL   HCT, POC 16.1  09.6 - 53.7 %   MCV 93.3  80 - 97 fL   MCH, POC 29.3  27 - 31.2 pg   MCHC 31.5 (*) 31.8 - 35.4 g/dL   RDW, POC 04.5     Platelet Count, POC 195  142 - 424 K/uL   MPV 8.8  0 - 99.8 fL   [facility] reading (PRIMARY) by  Dr. Milus Glazier CXR heavy markings.     ASSESSMENT AND PLAN:  56 y.o. year old male with bronchitis. 1. Bronchitis  azithromycin (ZITHROMAX Z-PAK) 250 MG tablet  2. Fever  DG Chest 2 View, POCT CBC  3. Cough  DG Chest 2 View, POCT CBC    - -Mucinex -Tylenol/Motrin prn -Rest/fluids -RTC precautions -RTC 3-5 days if no improvement  Signed, Elvina Sidle, MD 10/30/2011 4:03 PM

## 2011-12-22 ENCOUNTER — Ambulatory Visit (INDEPENDENT_AMBULATORY_CARE_PROVIDER_SITE_OTHER): Payer: BC Managed Care – PPO | Admitting: Family Medicine

## 2011-12-22 VITALS — BP 128/78 | HR 77 | Temp 98.7°F | Resp 17 | Ht 70.0 in | Wt 221.0 lb

## 2011-12-22 DIAGNOSIS — B349 Viral infection, unspecified: Secondary | ICD-10-CM

## 2011-12-22 DIAGNOSIS — M25469 Effusion, unspecified knee: Secondary | ICD-10-CM

## 2011-12-22 DIAGNOSIS — M23209 Derangement of unspecified meniscus due to old tear or injury, unspecified knee: Secondary | ICD-10-CM

## 2011-12-22 DIAGNOSIS — B9789 Other viral agents as the cause of diseases classified elsewhere: Secondary | ICD-10-CM

## 2011-12-22 DIAGNOSIS — M25461 Effusion, right knee: Secondary | ICD-10-CM

## 2011-12-22 DIAGNOSIS — M23302 Other meniscus derangements, unspecified lateral meniscus, unspecified knee: Secondary | ICD-10-CM

## 2011-12-22 LAB — POCT CBC
HCT, POC: 47.9 % (ref 43.5–53.7)
Hemoglobin: 15.4 g/dL (ref 14.1–18.1)
Lymph, poc: 1.2 (ref 0.6–3.4)
MCH, POC: 29.4 pg (ref 27–31.2)
MCV: 91.6 fL (ref 80–97)
MPV: 8.5 fL (ref 0–99.8)
POC MID %: 7.1 %M (ref 0–12)
RBC: 5.23 M/uL (ref 4.69–6.13)
WBC: 3.8 10*3/uL — AB (ref 4.6–10.2)

## 2011-12-22 MED ORDER — PROMETHAZINE HCL 25 MG PO TABS: 25.0000 mg | ORAL_TABLET | Freq: Three times a day (TID) | ORAL | Status: DC | PRN

## 2011-12-22 MED ORDER — GUAIFENESIN-CODEINE 100-10 MG/5ML PO SYRP: 5.0000 mL | ORAL_SOLUTION | Freq: Three times a day (TID) | ORAL | Status: DC | PRN

## 2011-12-22 MED ORDER — TRAMADOL HCL 50 MG PO TABS: 50.0000 mg | ORAL_TABLET | Freq: Three times a day (TID) | ORAL | Status: DC | PRN

## 2011-12-22 NOTE — Progress Notes (Signed)
Subjective:    Patient ID: Ruben West, male    DOB: Dec 02, 1955, 56 y.o.   MRN: [id number]  HPI  3d ago with head pressure and all muscles aching, cold chills, fever up to 100 and sometimes higher.  No known sick contacts. Getting lightheaded, a lot of sinus pressure and congestion, even eyes hurt. No rhinorrhea but is having post-nasal drip, slight cough, scratchy throat.  Symptoms worsening.  He tried a decongestant but didn't help. Pt is a truck driver  Has a torn meniscus in right knee. Yesterday thigh began to hurt and Rt knee became very swollen and painful but now pain is resolving and swelling going down.  Has tried some tramadol from ortho dr - Dr. Magnus Ivan - and Goody's powders.  Has had cortisone injection 6 mos ago into knee for a torn mensicus which helped a lot so he was able to put off surgery.  No warmth or redness.   Review of Systems  Constitutional: Positive for fever, chills, diaphoresis and fatigue. Negative for unexpected weight change.  HENT: Positive for sore throat and sinus pressure. Negative for trouble swallowing and voice change.   Respiratory: Positive for cough. Negative for shortness of breath and wheezing.   Cardiovascular: Negative for leg swelling.  Gastrointestinal: Positive for nausea. Negative for vomiting, abdominal pain, diarrhea and constipation.  Musculoskeletal: Positive for myalgias, joint swelling, arthralgias and gait problem.  Skin: Positive for rash.  Hematological: Negative for adenopathy. Does not bruise/bleed easily.  Psychiatric/Behavioral: Positive for disturbed wake/sleep cycle.       Objective:   Physical Exam  Constitutional: He is oriented to person, place, and time. He appears well-developed and well-nourished. No distress.  HENT:  Head: Normocephalic and atraumatic.  Right Ear: Tympanic membrane, external ear and ear canal normal.  Left Ear: Tympanic membrane, external ear and ear canal normal.  Nose: Nose normal.    Mouth/Throat: Oropharynx is clear and moist. No oropharyngeal exudate.  Eyes: Conjunctivae normal are normal. No scleral icterus.  Neck: Normal range of motion. Neck supple. No thyromegaly present.  Cardiovascular: Normal rate, regular rhythm, normal heart sounds and intact distal pulses.   Pulmonary/Chest: Effort normal and breath sounds normal.  Abdominal: Soft. Bowel sounds are normal. He exhibits no distension. There is no tenderness.  Musculoskeletal: He exhibits no edema.       Right knee: He exhibits effusion. He exhibits normal range of motion, no ecchymosis and no erythema.  Neurological: He is alert and oriented to person, place, and time. He exhibits normal muscle tone.  Skin: Skin is warm and dry. Rash noted. He is not diaphoretic.       Has petechiae over bilateral distal anterior lower extremities.  Psychiatric: He has a normal mood and affect. His behavior is normal.     Risks/benefits discussed and informed consent obtained.  Right knee cleaned with betadine x 3. Anesthesia with ethyl chloride cold spray.  Knee at 10 degree flexion. 21g 1 1/2 in needle introduced in superior lateral fashion and 40cc of clear yellow joint fluid aspirated and then injected with 40mg  DepoMedrol and 5cc 1% lidocaine w/o complication. Pt tolerated procedure well. Aftercare explained.     Results for orders placed in visit on 12/22/11  POCT CBC      Component Value Range   WBC 3.8 (*) 4.6 - 10.2 K/uL   Lymph, poc 1.2  0.6 - 3.4   POC LYMPH PERCENT 17.9  10 - 50 %L   MID (cbc) 0.5  0 - 0.9   POC MID % 7.1  0 - 12 %M   POC Granulocyte 5.1  2 - 6.9   Granulocyte percent 75.0  37 - 80 %G   RBC 5.23  4.69 - 6.13 M/uL   Hemoglobin 15.4  14.1 - 18.1 g/dL   HCT, POC 16.1  09.6 - 53.7 %   MCV 91.6  80 - 97 fL   MCH, POC 29.4  27 - 31.2 pg   MCHC 32.2  31.8 - 35.4 g/dL   RDW, POC 04.5     Platelet Count, POC 206  142 - 424 K/uL   MPV 8.5  0 - 99.8 fL  POCT INFLUENZA A/B      Component Value  Range   Influenza A, POC Negative     Influenza B, POC Negative      Assessment & Plan:  1.  Flu-like viral syndrome - Symptomatic care - codeine cough syrup, promethazine, rest, push fluids. 2. Rt knee effusion - aspirated 40cc and cortisone injection today. F/u with ortho as needed.

## 2011-12-22 NOTE — Patient Instructions (Signed)
Viral Infections A viral infection can be caused by different types of viruses.Most viral infections are not serious and resolve on their own. However, some infections may cause severe symptoms and may lead to further complications. SYMPTOMS Viruses can frequently cause:  Minor sore throat.   Aches and pains.   Headaches.   Runny nose.   Different types of rashes.   Watery eyes.   Tiredness.   Cough.   Loss of appetite.   Gastrointestinal infections, resulting in nausea, vomiting, and diarrhea.  These symptoms do not respond to antibiotics because the infection is not caused by bacteria. However, you might catch a bacterial infection following the viral infection. This is sometimes called a "superinfection." Symptoms of such a bacterial infection may include:  Worsening sore throat with pus and difficulty swallowing.   Swollen neck glands.   Chills and a high or persistent fever.   Severe headache.   Tenderness over the sinuses.   Persistent overall ill feeling (malaise), muscle aches, and tiredness (fatigue).   Persistent cough.   Yellow, green, or brown mucus production with coughing.  HOME CARE INSTRUCTIONS   Only take over-the-counter or prescription medicines for pain, discomfort, diarrhea, or fever as directed by your caregiver.   Drink enough water and fluids to keep your urine clear or pale yellow. Sports drinks can provide valuable electrolytes, sugars, and hydration.   Get plenty of rest and maintain proper nutrition. Soups and broths with crackers or rice are fine.  SEEK IMMEDIATE MEDICAL CARE IF:   You have severe headaches, shortness of breath, chest pain, neck pain, or an unusual rash.   You have uncontrolled vomiting, diarrhea, or you are unable to keep down fluids.   You or your child has an oral temperature above 102 F (38.9 C), not controlled by medicine.   Your baby is older than 3 months with a rectal temperature of 102 F (38.9 C) or  higher.   Your baby is 22 months old or younger with a rectal temperature of 100.4 F (38 C) or higher.  MAKE SURE YOU:   Understand these instructions.   Will watch your condition.   Will get help right away if you are not doing well or get worse.  Document Released: 12/23/2004 Document Revised: 03/04/2011 Document Reviewed: 07/20/2010 [facility] Patient Information 2012 [place], [place].Joint Injection Care After Refer to this sheet in the next few days. These instructions provide you with information on caring for yourself after you have had a joint injection. Your caregiver also may give you more specific instructions. Your treatment has been planned according to current medical practices, but problems sometimes occur. Call your caregiver if you have any problems or questions after your procedure. After any type of joint injection, it is not uncommon to experience:  Soreness, swelling, or bruising around the injection site.   Mild numbness, tingling, or weakness around the injection site caused by the numbing medicine used before or with the injection.  It also is possible to experience the following effects associated with the specific agent after injection:  Iodine-based contrast agents:   Allergic reaction (itching, hives, widespread redness, and swelling beyond the injection site).   Corticosteroids (These effects are rare.):   Allergic reaction.   Increased blood sugar levels (If you have diabetes and you notice that your blood sugar levels have increased, notify your caregiver).   Increased blood pressure levels.   Mood swings.   Hyaluronic acid in the use of viscosupplementation.   Temporary heat  or redness.   Temporary rash and itching.   Increased fluid accumulation in the injected joint.  These effects all should resolve within a day after your procedure.  HOME CARE INSTRUCTIONS  Limit yourself to light activity the day of your procedure. Avoid lifting heavy  objects, bending, stooping, or twisting.   Take prescription or over-the-counter pain medication as directed by your caregiver.   You may apply ice to your injection site to reduce pain and swelling the day of your procedure. Ice may be applied 3 to 4 times:   Put ice in a plastic bag.   Place a towel between your skin and the bag.   Leave the ice on for no longer than 15 to 20 minutes each time.  SEEK IMMEDIATE MEDICAL CARE IF:   Pain and swelling get worse rather than better or extend beyond the injection site.   Numbness does not go away.   Blood or fluid continues to leak from the injection site.   You have chest pain.   You have swelling of your face or tongue.   You have trouble breathing or you become dizzy.   You develop a fever, chills, or severe tenderness at the injection site that last longer than 1 day.  MAKE SURE YOU:  Understand these instructions.   Watch your condition.   Get help right away if you are not doing well or if you get worse.  Document Released: 11/26/2010 Document Revised: 03/04/2011 Document Reviewed: 11/26/2010 [facility] Patient Information 2012 [place], [place].

## 2011-12-22 NOTE — Addendum Note (Signed)
Addended by: Norberto Sorenson on: 12/22/2011 01:30 PM   Modules accepted: Orders

## 2012-03-20 ENCOUNTER — Ambulatory Visit (INDEPENDENT_AMBULATORY_CARE_PROVIDER_SITE_OTHER): Payer: BC Managed Care – PPO | Admitting: Family Medicine

## 2012-03-20 VITALS — BP 145/88 | HR 90 | Temp 98.3°F | Resp 16 | Ht 69.0 in | Wt 217.6 lb

## 2012-03-20 DIAGNOSIS — H9319 Tinnitus, unspecified ear: Secondary | ICD-10-CM

## 2012-03-20 DIAGNOSIS — R42 Dizziness and giddiness: Secondary | ICD-10-CM

## 2012-03-20 DIAGNOSIS — R269 Unspecified abnormalities of gait and mobility: Secondary | ICD-10-CM

## 2012-03-20 LAB — POCT URINALYSIS DIPSTICK
Bilirubin, UA: NEGATIVE
Blood, UA: NEGATIVE
Glucose, UA: NEGATIVE
Leukocytes, UA: NEGATIVE
Nitrite, UA: NEGATIVE
Spec Grav, UA: 1.02
Urobilinogen, UA: 2
pH, UA: 8

## 2012-03-20 LAB — POCT CBC
Granulocyte percent: 57.8 % (ref 37–80)
HCT, POC: 49.2 % (ref 43.5–53.7)
Hemoglobin: 16.5 g/dL (ref 14.1–18.1)
Lymph, poc: 3.4 (ref 0.6–3.4)
MCH, POC: 30.9 pg (ref 27–31.2)
MCHC: 33.5 g/dL (ref 31.8–35.4)
MCV: 92.1 fL (ref 80–97)
MID (cbc): 0.8 (ref 0–0.9)
MPV: 9.6 fL (ref 0–99.8)
POC Granulocyte: 5.8 (ref 2–6.9)
POC LYMPH PERCENT: 34 % (ref 10–50)
POC MID %: 8.2 % (ref 0–12)
Platelet Count, POC: 271 10*3/uL (ref 142–424)
RBC: 5.34 M/uL (ref 4.69–6.13)
RDW, POC: 13.2 %
WBC: 10 10*3/uL (ref 4.6–10.2)

## 2012-03-20 LAB — POCT UA - MICROSCOPIC ONLY
Bacteria, U Microscopic: NEGATIVE
Casts, Ur, LPF, POC: NEGATIVE
Crystals, Ur, HPF, POC: NEGATIVE
Epithelial cells, urine per micros: NEGATIVE
Mucus, UA: NEGATIVE
RBC, urine, microscopic: NEGATIVE
WBC, Ur, HPF, POC: 0.2
Yeast, UA: NEGATIVE

## 2012-03-20 MED ORDER — MECLIZINE HCL 50 MG PO TABS: 50.0000 mg | ORAL_TABLET | Freq: Three times a day (TID) | ORAL | Status: DC | PRN

## 2012-03-20 NOTE — Progress Notes (Signed)
[facility]:  Office Visit  Chief Complaint:  Chief Complaint  Patient presents with  . Dizziness    x 2 weeks     HPI: Ruben West is a 56 y.o. male who complains of  2 week history of intermittent dizziness, especially when he lays down. Has dizziness when he turns his head. Gets it mostly when he changes his positions and moves his head. + Nauseated x 2 . Has had prior vertigo but that was many years ago. Has had brain scans which  Were all negative and was given meclizine. Also has a h/o migraine HAs. Recently had a 1 week h/o cold. Now cold sxs are gone. However dizziness remains and his ears are ringing, and he has nausea.   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 Lays down.. Denies any ear pain or hearing loss. Denies vision problems, hearing loss, confusion or asymmetric weakness. No prior brain injury. HTN well controlled.   Past Medical History  Diagnosis Date  . Hypercholesteremia   . ALLERGIC RHINITIS 11/23/2006  . HYPERTENSION 11/23/2006  . HYPERGLYCEMIA 04/04/2007  . GERD 04/04/2007  . FATTY LIVER DISEASE 05/09/2008  . 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 Migraines   . NASH (nonalcoholic steatohepatitis)   . PUD (peptic ulcer disease)    Past Surgical History  Procedure Date  . Vasectomy   . Electrocardiogram 02/21/2006   History   Social History  . Marital Status: Married    Spouse Name: N/A    Number of Children: N/A  . 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 Smoking status: Never Smoker   . Smokeless tobacco: None  . Alcohol Use: No  . Drug Use: No  . Sexually Active: None   Other Topics Concern  . None   Social History Narrative  . None   Family History  Problem Relation Age of Onset  . Heart attack Mother     MI  .  Cancer Neg Hx     no cancer in immediate family   Allergies  Allergen Reactions  . Oxycodone Nausea And Vomiting  . Vicodin (Hydrocodone-Acetaminophen)     headache   Prior to Admission medications   Medication Sig Start Date End Date Taking? Authorizing Provider  aspirin 81 MG tablet Take 81 mg by mouth daily.   Yes Historical Provider, MD  clomiPHENE (CLOMID) 50 MG tablet 1/4 tablet by mouth daily 07/12/11  Yes Romero Belling, MD  losartan-hydrochlorothiazide (HYZAAR) 50-12.5 MG per tablet Take 1 tablet by mouth daily. 07/12/11  Yes Romero Belling, MD  lovastatin (MEVACOR) 40 MG tablet Take 1 tablet (40 mg total) by mouth at bedtime. 07/12/11  Yes Romero Belling, MD  tadalafil (CIALIS) 10 MG tablet Take 1 tablet (10 mg total) by mouth as needed for erectile dysfunction. 07/12/11  Yes Romero Belling, MD  traMADol (ULTRAM) 50 MG tablet Take 1 tablet (50 mg total) by mouth every 8 (eight) hours as needed for pain. 12/22/11  Yes Sherren Mocha, MD  guaiFENesin-codeine [facility]) 100-10 MG/5ML syrup Take 5 mLs by mouth 3 (three) times daily as needed for cough. 12/22/11   Sherren Mocha, MD  promethazine (PHENERGAN) 25 MG tablet Take 1 tablet (25 mg total) by mouth every 8 (eight) hours as  needed for nausea. 12/22/11   Sherren Mocha, MD  tadalafil (CIALIS) 10 MG tablet Take 1 tablet (10 mg total) by mouth daily as needed for erectile dysfunction. 01/15/11 02/14/11  Romero Belling, MD     ROS: The patient denies fevers, chills, night sweats, unintentional weight loss, chest pain, palpitations, wheezing, dyspnea on exertion, vomiting, abdominal pain, dysuria, hematuria, melena, numbness, weakness, or tingling.   All other systems have been reviewed and were otherwise negative with the exception of those mentioned in the HPI and as above.    PHYSICAL EXAM: Filed Vitals:   03/20/12 1823  BP: 145/88  Pulse: 90  Temp: 98.3 F (36.8 C)  Resp: 16   Filed Vitals:   03/20/12 1823  Height: 5\' 9"  (1.753 m)  Weight:  217 lb 9.6 oz (98.703 kg)   Body mass index is 32.13 kg/(m^2).  General: Alert, no acute distress HEENT:  Normocephalic, atraumatic, oropharynx patent. EOMI, PERRLA, fundoscopic exam nl. TM nl bilaterally. Dizzy with head and eye movement Cardiovascular:  Regular rate and rhythm, no rubs murmurs or gallops.  No Carotid bruits, radial pulse intact. No pedal edema.  Respiratory: Clear to auscultation bilaterally.  No wheezes, rales, or rhonchi.  No cyanosis, no use of accessory musculature GI: No organomegaly, abdomen is soft and non-tender, positive bowel sounds.  No masses. Skin: No rashes. Neurologic: Facial musculature symmetric. CN 2-12 grossly intact. Romberg + dizziness but did not fall, + dizziness and prominent gait changes with heel to toe Psychiatric: Patient is appropriate throughout our interaction. Lymphatic: No cervical lymphadenopathy Musculoskeletal: Gait changes as above, 5/5 strength  UE and Sandrine Bloodsworth Dix Hallpike-No nystagmus, + dizziness  LABS: Results for orders placed in visit on 03/20/12  POCT CBC      Component Value Range   WBC 10.0  4.6 - 10.2 K/uL   Lymph, poc 3.4  0.6 - 3.4   POC LYMPH PERCENT 34.0  10 - 50 %L   MID (cbc) 0.8  0 - 0.9   POC MID % 8.2  0 - 12 %M   POC Granulocyte 5.8  2 - 6.9   Granulocyte percent 57.8  37 - 80 %G   RBC 5.34  4.69 - 6.13 M/uL   Hemoglobin 16.5  14.1 - 18.1 g/dL   HCT, POC 16.1  09.6 - 53.7 %   MCV 92.1  80 - 97 fL   MCH, POC 30.9  27 - 31.2 pg   MCHC 33.5  31.8 - 35.4 g/dL   RDW, POC 04.5     Platelet Count, POC 271  142 - 424 K/uL   MPV 9.6  0 - 99.8 fL  POCT URINALYSIS DIPSTICK      Component Value Range   Color, UA yellow     Clarity, UA clear     Glucose, UA neg     Bilirubin, UA neg     Ketones, UA trace     Spec Grav, UA 1.020     Blood, UA neg     pH, UA 8.0     Protein, UA trace     Urobilinogen, UA 2.0     Nitrite, UA neg     Leukocytes, UA Negative    POCT UA - MICROSCOPIC ONLY      Component Value Range    WBC, Ur, HPF, POC 0.2     RBC, urine, microscopic neg     Bacteria, U Microscopic neg     Mucus, UA neg  Epithelial cells, urine per micros neg     Crystals, Ur, HPF, POC neg     Casts, Ur, LPF, POC neg     Yeast, UA neg       EKG/XRAY:   Primary read interpreted by Dr. Conley Rolls at [facility].   ASSESSMENT/PLAN: Encounter Diagnoses  Name Primary?  . Dizziness Yes  . Gait abnormality   . Tinnitus of both ears    Nonorthostatic laying down  138/91, 87; 135/90,97  Standing 132/97, 92 Orthostatics normal, HTN is well controlled at home Etiology?---Vertigo due to BPPV vs labrynthitis since has had recent URI vs [place]  vs possibly acoustic neuroma  Trial of meclizine, will get basic labs  Vertigo has never been this severe, has never had tinnitis or gait changes.  Labs pending Go to Er for worsening sxs We will try a trial of meclizine to see if he feels better since it has worked in the past, if it does not then we will get a MRI brain without contrast. Patient drive tractor trailer.     Hamilton Capri PHUONG, DO 03/20/2012 7:16 PM

## 2012-03-21 ENCOUNTER — Other Ambulatory Visit: Payer: Self-pay | Admitting: Family Medicine

## 2012-03-21 DIAGNOSIS — R42 Dizziness and giddiness: Secondary | ICD-10-CM

## 2012-03-21 LAB — COMPREHENSIVE METABOLIC PANEL
Albumin: 4.6 g/dL (ref 3.5–5.2)
BUN: 16 mg/dL (ref 6–23)
Calcium: 9.7 mg/dL (ref 8.4–10.5)
Chloride: 103 mEq/L (ref 96–112)
Creat: 1.27 mg/dL (ref 0.50–1.35)
Glucose, Bld: 95 mg/dL (ref 70–99)
Potassium: 4.5 mEq/L (ref 3.5–5.3)

## 2012-03-21 LAB — COMPREHENSIVE METABOLIC PANEL WITH GFR
ALT: 26 U/L (ref 0–53)
AST: 27 U/L (ref 0–37)
Alkaline Phosphatase: 66 U/L (ref 39–117)
CO2: 29 meq/L (ref 19–32)
Sodium: 142 meq/L (ref 135–145)
Total Bilirubin: 0.5 mg/dL (ref 0.3–1.2)
Total Protein: 7.1 g/dL (ref 6.0–8.3)

## 2012-03-21 NOTE — Progress Notes (Signed)
Patient not improved on meclizine. Will get MRI brain with and without contrast.

## 2012-03-25 ENCOUNTER — Ambulatory Visit
Admission: RE | Admit: 2012-03-25 | Discharge: 2012-03-25 | Disposition: A | Discharge status: Home / Self Care | Payer: BC Managed Care – PPO | Source: Ambulatory Visit | Attending: Family Medicine | Admitting: Family Medicine

## 2012-03-25 DIAGNOSIS — R42 Dizziness and giddiness: Secondary | ICD-10-CM

## 2012-03-25 MED ORDER — GADOBENATE DIMEGLUMINE 529 MG/ML IV SOLN
20.0000 mL | Freq: Once | INTRAVENOUS | Status: AC | PRN
  Administered 2012-03-25: 20 mL via INTRAVENOUS

## 2012-03-28 ENCOUNTER — Telehealth: Payer: Self-pay | Admitting: Radiology

## 2012-03-28 NOTE — Telephone Encounter (Signed)
Message copied by Caffie Damme on Tue Mar 28, 2012  3:09 PM ------      Message from: [place], [place]      Created: Sun Mar 26, 2012  9:11 PM       Please let him know that his MRI does not show anything acute.

## 2012-03-28 NOTE — Telephone Encounter (Signed)
Called patient, he is advised. When can he return to work? He feels better.

## 2012-03-28 NOTE — Telephone Encounter (Signed)
Pt CB because he left form to be completed by Dr Conley Rolls that needs to be faxed in by Thurs. I have put the form in Dr Irwin Brakeman box.

## 2012-03-30 ENCOUNTER — Telehealth: Payer: Self-pay | Admitting: Family Medicine

## 2012-03-30 NOTE — Telephone Encounter (Signed)
Patient is 100 % better, recovered. His labs and scan were unremarkable for anything acute.

## 2012-05-16 ENCOUNTER — Other Ambulatory Visit: Payer: Self-pay | Admitting: *Deleted

## 2012-05-16 MED ORDER — TADALAFIL 10 MG PO TABS: 10.0000 mg | ORAL_TABLET | ORAL | Status: DC | PRN

## 2012-05-22 ENCOUNTER — Ambulatory Visit (INDEPENDENT_AMBULATORY_CARE_PROVIDER_SITE_OTHER): Payer: BC Managed Care – PPO | Admitting: Endocrinology

## 2012-05-22 VITALS — BP 130/80 | HR 73 | Wt 232.0 lb

## 2012-05-22 DIAGNOSIS — F329 Major depressive disorder, single episode, unspecified: Secondary | ICD-10-CM

## 2012-05-22 MED ORDER — ZOLPIDEM TARTRATE 10 MG PO TABS: 10.0000 mg | ORAL_TABLET | Freq: Every evening | ORAL | Status: DC | PRN

## 2012-05-22 MED ORDER — LOSARTAN POTASSIUM-HCTZ 50-12.5 MG PO TABS: 1.0000 | ORAL_TABLET | Freq: Every day | ORAL | Status: DC

## 2012-05-22 MED ORDER — VENLAFAXINE HCL ER 150 MG PO CP24: 150.0000 mg | ORAL_CAPSULE | Freq: Every day | ORAL | Status: DC

## 2012-05-22 NOTE — Progress Notes (Signed)
Subjective:    Patient ID: Ruben West, male    DOB: Aug 07, 1955, 57 y.o.   MRN: [id number]  HPI Pt states 2 years of moderate depression, and tremor of the hands, and assoc anxiety.  He feels this was precip by the death of his father, and worsened by a recent injury to his lower back(he takes only prednisone for this).  He does not have a rtw date.  He has had episodes of this in the past, but he has never been on rx for this.   Past Medical History  Diagnosis Date  . Hypercholesteremia   . ALLERGIC RHINITIS 11/23/2006  . HYPERTENSION 11/23/2006  . HYPERGLYCEMIA 04/04/2007  . GERD 04/04/2007  . FATTY LIVER DISEASE 05/09/2008  . 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 Migraines   . NASH (nonalcoholic steatohepatitis)   . PUD (peptic ulcer disease)     Past Surgical History  Procedure Laterality Date  . Vasectomy    . Electrocardiogram  02/21/2006    History   Social History  . Marital Status: Married    Spouse Name: N/A    Number of Children: N/A  . 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 Smoking status: Never Smoker   . Smokeless tobacco: Not on file  . Alcohol Use: No  . Drug Use: No  . Sexually Active: Not on file   Other Topics Concern  . Not on file   Social History Narrative  . No narrative on file    Current Outpatient Prescriptions on File Prior to Visit  Medication Sig Dispense Refill  . aspirin 81 MG tablet Take 81 mg by mouth daily.      . clomiPHENE (CLOMID) 50 MG tablet 1/4 tablet by mouth daily  25 tablet  3  . guaiFENesin-codeine (ROBITUSSIN AC) 100-10 MG/5ML syrup Take 5 mLs by mouth 3 (three) times daily as needed for cough.  120 mL  0  . lovastatin (MEVACOR) 40 MG tablet Take 1 tablet (40 mg total) by mouth at bedtime.  90 tablet  3  . meclizine (ANTIVERT) 50 MG tablet Take 1 tablet (50 mg total) by mouth 3 (three) times daily as needed.  30 tablet  0   . promethazine (PHENERGAN) 25 MG tablet Take 1 tablet (25 mg total) by mouth every 8 (eight) hours as needed for nausea.  20 tablet  0  . tadalafil (CIALIS) 10 MG tablet Take 1 tablet (10 mg total) by mouth as needed for erectile dysfunction.  10 tablet  2  . traMADol (ULTRAM) 50 MG tablet Take 1 tablet (50 mg total) by mouth every 8 (eight) hours as needed for pain.  90 tablet  0  . tadalafil (CIALIS) 10 MG tablet Take 1 tablet (10 mg total) by mouth daily as needed for erectile dysfunction.  10 tablet  0   No current facility-administered medications on file prior to visit.    Allergies  Allergen Reactions  . Oxycodone Nausea And Vomiting  . Vicodin (Hydrocodone-Acetaminophen)     headache    Family History  Problem Relation Age of Onset  . Heart attack Mother     MI  . Cancer Neg Hx     no cancer in immediate family  mother had depression  BP 130/80  Pulse 73  Wt 232 lb (105.235 kg)  BMI 34.24 kg/m2  SpO2 98%    Review  of Systems He has insomnia, but he has weight gain.      Objective:   Physical Exam VITAL SIGNS:  See vs page GENERAL: no distress PSYCH: Alert and oriented x 3.  Does not appear anxious nor depressed at the time of ov.         Assessment & Plan:  Depression/anxiety, new

## 2012-05-22 NOTE — Patient Instructions (Addendum)
i have sent a prescription to your pharmacy, for a pill against the depression. Here is a prescription for a medication to help you sleep.  Take it as needed. Refer to a psychiatry specialist.  you will receive a phone call, about a day and time for an appointment.

## 2012-06-29 ENCOUNTER — Encounter: Payer: Self-pay | Admitting: Gastroenterology

## 2012-06-30 ENCOUNTER — Ambulatory Visit (INDEPENDENT_AMBULATORY_CARE_PROVIDER_SITE_OTHER): Payer: BC Managed Care – PPO | Admitting: Endocrinology

## 2012-06-30 ENCOUNTER — Encounter: Payer: Self-pay | Admitting: Endocrinology

## 2012-06-30 VITALS — BP 128/76 | HR 76 | Wt 242.0 lb

## 2012-06-30 DIAGNOSIS — Z125 Encounter for screening for malignant neoplasm of prostate: Secondary | ICD-10-CM

## 2012-06-30 DIAGNOSIS — G43909 Migraine, unspecified, not intractable, without status migrainosus: Secondary | ICD-10-CM | POA: Insufficient documentation

## 2012-06-30 DIAGNOSIS — Z0000 Encounter for general adult medical examination without abnormal findings: Secondary | ICD-10-CM

## 2012-06-30 DIAGNOSIS — Z79899 Other long term (current) drug therapy: Secondary | ICD-10-CM

## 2012-06-30 DIAGNOSIS — R7309 Other abnormal glucose: Secondary | ICD-10-CM

## 2012-06-30 DIAGNOSIS — E291 Testicular hypofunction: Secondary | ICD-10-CM

## 2012-06-30 DIAGNOSIS — I10 Essential (primary) hypertension: Secondary | ICD-10-CM

## 2012-06-30 DIAGNOSIS — K7689 Other specified diseases of liver: Secondary | ICD-10-CM

## 2012-06-30 LAB — URINALYSIS, ROUTINE W REFLEX MICROSCOPIC
Ketones, ur: NEGATIVE
Leukocytes, UA: NEGATIVE
Specific Gravity, Urine: 1.025 (ref 1.000–1.030)
Total Protein, Urine: NEGATIVE
Urine Glucose: NEGATIVE
pH: 5.5 (ref 5.0–8.0)

## 2012-06-30 LAB — CBC WITH DIFFERENTIAL/PLATELET
Basophils Relative: 0.8 % (ref 0.0–3.0)
Eosinophils Relative: 2.8 % (ref 0.0–5.0)
HCT: 47.1 % (ref 39.0–52.0)
Hemoglobin: 16.1 g/dL (ref 13.0–17.0)
Lymphocytes Relative: 40 % (ref 12.0–46.0)
Lymphs Abs: 3 10*3/uL (ref 0.7–4.0)
Monocytes Relative: 10.8 % (ref 3.0–12.0)
Neutro Abs: 3.4 10*3/uL (ref 1.4–7.7)
RBC: 5.38 Mil/uL (ref 4.22–5.81)

## 2012-06-30 LAB — HEPATIC FUNCTION PANEL
ALT: 39 U/L (ref 0–53)
AST: 23 U/L (ref 0–37)
Albumin: 3.9 g/dL (ref 3.5–5.2)

## 2012-06-30 LAB — BASIC METABOLIC PANEL
CO2: 29 mEq/L (ref 19–32)
Calcium: 9.2 mg/dL (ref 8.4–10.5)
Creatinine, Ser: 1 mg/dL (ref 0.4–1.5)
GFR: 83.84 mL/min (ref >60.00)

## 2012-06-30 LAB — LIPID PANEL
Cholesterol: 144 mg/dL (ref 0–200)
HDL: 37.9 mg/dL — ABNORMAL LOW (ref >39.00)
Total CHOL/HDL Ratio: 4
Triglycerides: 222 mg/dL — ABNORMAL HIGH (ref 0.0–149.0)

## 2012-06-30 LAB — PSA: PSA: 0.29 ng/mL (ref 0.10–4.00)

## 2012-06-30 LAB — TSH: TSH: 3.27 u[IU]/mL (ref 0.35–5.50)

## 2012-06-30 MED ORDER — SUMATRIPTAN SUCCINATE 100 MG PO TABS: 100.0000 mg | ORAL_TABLET | ORAL | Status: DC | PRN

## 2012-06-30 MED ORDER — PROMETHAZINE HCL 25 MG PO TABS: 25.0000 mg | ORAL_TABLET | Freq: Three times a day (TID) | ORAL | Status: DC | PRN

## 2012-06-30 NOTE — Patient Instructions (Addendum)
blood tests are being requested for you today.  We'll contact you with results. i have sent a prescription to your pharmacy, to try for the migraine. Please come in soon for your regular physical (after 07/12/12).

## 2012-06-30 NOTE — Progress Notes (Signed)
Subjective:    Patient ID: Ruben West, male    DOB: [birth year], 57 y.o.   MRN: [id number]  HPI The state of at least three ongoing medical problems is addressed today, with interval history of each noted here: Pt says his depression is much better.  He does not take the effexor.  He has not yet returned to work (injured his back, due to a fall at work).   NASH: He has weight gain. HTN: he denies SOB. Migraine has recurred recently.  He saw neurology a few years ago. Past Medical History  Diagnosis Date  . Hypercholesteremia   . ALLERGIC RHINITIS 11/23/2006  . HYPERTENSION 11/23/2006  . HYPERGLYCEMIA 04/04/2007  . GERD 04/04/2007  . FATTY LIVER DISEASE 05/09/2008  . 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 Migraines   . NASH (nonalcoholic steatohepatitis)   . PUD (peptic ulcer disease)     Past Surgical History  Procedure Laterality Date  . Vasectomy    . Electrocardiogram  02/21/2006    History   Social History  . Marital Status: Married    Spouse Name: N/A    Number of Children: N/A  . 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 Smoking status: Never Smoker   . Smokeless tobacco: Not on file  . Alcohol Use: No  . Drug Use: No  . Sexually Active: Not on file   Other Topics Concern  . Not on file   Social History Narrative  . No narrative on file    Current Outpatient Prescriptions on File Prior to Visit  Medication Sig Dispense Refill  . aspirin 81 MG tablet Take 81 mg by mouth daily.      Marland Kitchen losartan-hydrochlorothiazide (HYZAAR) 50-12.5 MG per tablet Take 1 tablet by mouth daily.  90 tablet  3  . lovastatin (MEVACOR) 40 MG tablet Take 1 tablet (40 mg total) by mouth at bedtime.  90 tablet  3  . clomiPHENE (CLOMID) 50 MG tablet 1/4 tablet by mouth daily  25 tablet  3  . meclizine (ANTIVERT) 50 MG tablet Take 1 tablet (50 mg total) by mouth 3 (three) times daily as  needed.  30 tablet  0  . tadalafil (CIALIS) 10 MG tablet Take 1 tablet (10 mg total) by mouth as needed for erectile dysfunction.  10 tablet  2  . traMADol (ULTRAM) 50 MG tablet Take 1 tablet (50 mg total) by mouth every 8 (eight) hours as needed for pain.  90 tablet  0  . zolpidem (AMBIEN) 10 MG tablet Take 1 tablet (10 mg total) by mouth at bedtime as needed for sleep.  30 tablet  1   No current facility-administered medications on file prior to visit.    Allergies  Allergen Reactions  . Oxycodone Nausea And Vomiting  . Vicodin (Hydrocodone-Acetaminophen)     headache    Family History  Problem Relation Age of Onset  . Heart attack Mother     MI  . Cancer Neg Hx     no cancer in immediate family   BP 128/76  Pulse 76  Wt 242 lb (109.77 kg)  BMI 35.72 kg/m2  SpO2 98%  Review of Systems Pt says cialis helps ED sxs.  Denies dizziness.    Objective:   Physical Exam VITAL SIGNS:  See vs page GENERAL: no distress GENITALIA: Normal male testicles, scrotum, and penis  Assessment & Plan:  Hypogonadism.  sxs are well-controlled Depression, much better HTN: well-controlled Hyperglycemia: complicated by weight gain, and inactivity. Migraine, recurrent.

## 2012-07-12 ENCOUNTER — Encounter: Payer: Self-pay | Admitting: Gastroenterology

## 2012-07-27 ENCOUNTER — Ambulatory Visit (AMBULATORY_SURGERY_CENTER): Payer: BC Managed Care – PPO | Admitting: *Deleted

## 2012-07-27 VITALS — Ht 70.0 in | Wt 244.4 lb

## 2012-07-27 DIAGNOSIS — Z86010 Personal history of colonic polyps: Secondary | ICD-10-CM

## 2012-07-27 DIAGNOSIS — Z1211 Encounter for screening for malignant neoplasm of colon: Secondary | ICD-10-CM

## 2012-07-27 MED ORDER — PEG-KCL-NACL-NASULF-NA ASC-C 100 G PO SOLR: ORAL | Status: DC

## 2012-07-27 NOTE — Progress Notes (Signed)
No egg or soy allergy  Pt registered in [place] and access code information given

## 2012-07-28 ENCOUNTER — Encounter: Payer: Self-pay | Admitting: Gastroenterology

## 2012-08-02 ENCOUNTER — Ambulatory Visit (AMBULATORY_SURGERY_CENTER): Payer: BC Managed Care – PPO | Admitting: Gastroenterology

## 2012-08-02 ENCOUNTER — Encounter: Payer: Self-pay | Admitting: Gastroenterology

## 2012-08-02 VITALS — BP 134/84 | HR 77 | Temp 96.7°F | Resp 18 | Ht 70.0 in | Wt 244.0 lb

## 2012-08-02 DIAGNOSIS — Z86010 Personal history of colonic polyps: Secondary | ICD-10-CM

## 2012-08-02 DIAGNOSIS — Z1211 Encounter for screening for malignant neoplasm of colon: Secondary | ICD-10-CM

## 2012-08-02 MED ORDER — SODIUM CHLORIDE 0.9 % IV SOLN: 500.0000 mL | INTRAVENOUS | Status: DC

## 2012-08-02 NOTE — Op Note (Signed)
[facility] [address] [place], [address]   COLONOSCOPY PROCEDURE REPORT  PATIENT: Ruben, West  MR#: [id number] BIRTHDATE: November 12, 1955 , 56  yrs. old GENDER: Male ENDOSCOPIST: Mardella Layman, MD, [facility] REFERRED BY: PROCEDURE DATE:  08/02/2012 PROCEDURE:   Colonoscopy, surveillance ASA CLASS:   Class II INDICATIONS:Patient's personal history of adenomatous colon polyps.  MEDICATIONS: propofol (Diprivan) 250mg  IV  DESCRIPTION OF PROCEDURE:   After the risks and benefits and of the procedure were explained, informed consent was obtained.  A digital rectal exam revealed no abnormalities of the rectum.    The LB [id number]  endoscope was introduced through the anus and advanced to the cecum, which was identified by both the appendix and ileocecal valve .  The quality of the prep was excellent, using MoviPrep .  The instrument was then slowly withdrawn as the colon was fully examined.     COLON FINDINGS: A normal appearing cecum, ileocecal valve, and appendiceal orifice were identified.  The ascending, hepatic flexure, transverse, splenic flexure, descending, sigmoid colon and rectum appeared unremarkable.  No polyps or cancers were seen. Retroflexed views revealed no abnormalities.     The scope was then withdrawn from the patient and the procedure completed.  COMPLICATIONS: There were no complications. ENDOSCOPIC IMPRESSION: Normal colon...no polyps noted...  RECOMMENDATIONS: Repeat Colonscopy in 5 years.   REPEAT EXAM:  XB:JYNW Ardeen Garland, MD  _______________________________ eSigned:  Mardella Layman, MD, [facility] 08/02/2012 3:46 PM

## 2012-08-02 NOTE — Patient Instructions (Addendum)
Discharge instructions given with verbal understanding. Normal exam. Resume previous medications. YOU HAD AN ENDOSCOPIC PROCEDURE TODAY AT THE [facility]: Refer to the procedure report that was given to you for any specific questions about what was found during the examination.  If the procedure report does not answer your questions, please call your gastroenterologist to clarify.  If you requested that your care partner not be given the details of your procedure findings, then the procedure report has been included in a sealed envelope for you to review at your convenience later.  YOU SHOULD EXPECT: Some feelings of bloating in the abdomen. Passage of more gas than usual.  Walking can help get rid of the air that was put into your GI tract during the procedure and reduce the bloating. If you had a lower endoscopy (such as a colonoscopy or flexible sigmoidoscopy) you may notice spotting of blood in your stool or on the toilet paper. If you underwent a bowel prep for your procedure, then you may not have a normal bowel movement for a few days.  DIET: Your first meal following the procedure should be a light meal and then it is ok to progress to your normal diet.  A half-sandwich or bowl of soup is an example of a good first meal.  Heavy or fried foods are harder to digest and may make you feel nauseous or bloated.  Likewise meals heavy in dairy and vegetables can cause extra gas to form and this can also increase the bloating.  Drink plenty of fluids but you should avoid alcoholic beverages for 24 hours.  ACTIVITY: Your care partner should take you home directly after the procedure.  You should plan to take it easy, moving slowly for the rest of the day.  You can resume normal activity the day after the procedure however you should NOT DRIVE or use heavy machinery for 24 hours (because of the sedation medicines used during the test).    SYMPTOMS TO REPORT IMMEDIATELY: A gastroenterologist  can be reached at any hour.  During normal business hours, 8:30 AM to 5:00 PM Monday through Friday, call [phone].  After hours and on weekends, please call the GI answering service at [phone] who will take a message and have the physician on call contact you.   Following lower endoscopy (colonoscopy or flexible sigmoidoscopy):  Excessive amounts of blood in the stool  Significant tenderness or worsening of abdominal pains  Swelling of the abdomen that is new, acute  Fever of 100F or higher  FOLLOW UP: If any biopsies were taken you will be contacted by phone or by letter within the next 1-3 weeks.  Call your gastroenterologist if you have not heard about the biopsies in 3 weeks.  Our staff will call the home number listed on your records the next business day following your procedure to check on you and address any questions or concerns that you may have at that time regarding the information given to you following your procedure. This is a courtesy call and so if there is no answer at the home number and we have not heard from you through the emergency physician on call, we will assume that you have returned to your regular daily activities without incident.  SIGNATURES/CONFIDENTIALITY: You and/or your care partner have signed paperwork which will be entered into your electronic medical record.  These signatures attest to the fact that that the information above on your After Visit Summary has been reviewed   and is understood.  Full responsibility of the confidentiality of this discharge information lies with you and/or your care-partner. 

## 2012-08-02 NOTE — Progress Notes (Signed)
Patient did not experience any of the following events: a burn prior to discharge; a fall within the facility; wrong site/side/patient/procedure/implant event; or a hospital transfer or hospital admission upon discharge from the facility. (G8907) Patient did not have preoperative order for IV antibiotic SSI prophylaxis. (G8918)  

## 2012-08-03 ENCOUNTER — Telehealth: Payer: Self-pay | Admitting: *Deleted

## 2012-08-03 NOTE — Telephone Encounter (Signed)
  Follow up Call-  Call back number 08/02/2012  Post procedure Call Back phone  # [phone]  Permission to leave phone message Yes     Patient questions:  Do you have a fever, pain , or abdominal swelling? no Pain Score  0 *  Have you tolerated food without any problems? yes  Have you been able to return to your normal activities? yes  Do you have any questions about your discharge instructions: Diet   no Medications  no Follow up visit  no  Do you have questions or concerns about your Care? no  Actions: * If pain score is 4 or above: No action needed, pain <4.

## 2012-08-16 ENCOUNTER — Encounter: Payer: BC Managed Care – PPO | Admitting: Gastroenterology

## 2012-10-05 ENCOUNTER — Other Ambulatory Visit: Payer: Self-pay | Admitting: Endocrinology

## 2012-10-06 ENCOUNTER — Other Ambulatory Visit: Payer: Self-pay | Admitting: *Deleted

## 2012-10-06 MED ORDER — LOVASTATIN 40 MG PO TABS: 40.0000 mg | ORAL_TABLET | Freq: Every day | ORAL | Status: DC

## 2012-10-13 ENCOUNTER — Telehealth: Payer: Self-pay | Admitting: Endocrinology

## 2012-10-13 MED ORDER — LOVASTATIN 40 MG PO TABS: 40.0000 mg | ORAL_TABLET | Freq: Every day | ORAL | Status: DC

## 2012-10-13 NOTE — Telephone Encounter (Signed)
Called pt and told him what Dr Everardo All said. Pt states he had his blood work done in April at his last OV. All they need is his numbers from his cholesterol check and blood pressure. Please advise. Pt needs this to help him save money on his insurance. Thank you.

## 2012-10-13 NOTE — Telephone Encounter (Signed)
please call patient: i received form We'll need to do this at your cpx.  Please come in for this.

## 2012-10-13 NOTE — Telephone Encounter (Signed)
i believe what you say, but this form wants me to take the measurements.  We can hold-off on the form if you want, or you can report the numbers if you wish.

## 2012-10-13 NOTE — Telephone Encounter (Signed)
Generic Levacor?, [facility] has sent a refill request. Have not heard anything back. Please call pt. / Sherri S.

## 2012-10-13 NOTE — Telephone Encounter (Signed)
[facility] did not get refill.

## 2012-10-16 ENCOUNTER — Telehealth: Payer: Self-pay

## 2012-10-16 NOTE — Telephone Encounter (Signed)
PT's wife advised per Dr Everardo All, [facility] form can not be filled out until pt has a cpx, form mailed back to pt

## 2012-10-20 NOTE — Telephone Encounter (Signed)
Pt advised [facility] form is ready to be picked up

## 2012-10-20 NOTE — Telephone Encounter (Signed)
Ok, i'll do those parts of the form

## 2012-11-20 ENCOUNTER — Encounter: Payer: Self-pay | Admitting: Endocrinology

## 2012-11-20 ENCOUNTER — Ambulatory Visit (INDEPENDENT_AMBULATORY_CARE_PROVIDER_SITE_OTHER): Payer: BC Managed Care – PPO | Admitting: Endocrinology

## 2012-11-20 VITALS — BP 126/70 | HR 78 | Ht 69.0 in | Wt 233.0 lb

## 2012-11-20 DIAGNOSIS — Z0000 Encounter for general adult medical examination without abnormal findings: Secondary | ICD-10-CM

## 2012-11-20 DIAGNOSIS — E291 Testicular hypofunction: Secondary | ICD-10-CM

## 2012-11-20 LAB — TESTOSTERONE: Testosterone: 246.26 ng/dL — ABNORMAL LOW (ref 350.00–890.00)

## 2012-11-20 MED ORDER — CLOMIPHENE CITRATE 50 MG PO TABS: ORAL_TABLET | ORAL | Status: DC

## 2012-11-20 MED ORDER — SILDENAFIL CITRATE 20 MG PO TABS: ORAL_TABLET | ORAL | Status: DC

## 2012-11-20 NOTE — Patient Instructions (Addendum)
please consider these measures for your health:  minimize alcohol.  do not use tobacco products.  have a colonoscopy at least every 10 years from age 57.  keep firearms safely stored.  always use seat belts.  have working smoke alarms in your home.  see an eye doctor and dentist regularly.  never drive under the influence of alcohol or drugs (including prescription drugs).  those with fair skin should take precautions against the sun. blood tests are being requested for you today.  We'll contact you with results.  Loratadine-d (non-prescription) will help your congestion.  Please come back for a follow-up appointment in 6 months.   i have sent a prescription to [facility], for a generic strength of viagra.

## 2012-11-20 NOTE — Progress Notes (Signed)
Subjective:    Patient ID: Ruben West, male    DOB: [birth year], 57 y.o.   MRN: [id number]  HPI Pt is here for regular wellness examination, and is feeling pretty well in general, and says chronic med probs are stable, except as noted below Past Medical History  Diagnosis Date  . Hypercholesteremia   . ALLERGIC RHINITIS 11/23/2006  . HYPERTENSION 11/23/2006  . HYPERGLYCEMIA 04/04/2007  . FATTY LIVER DISEASE 05/09/2008  . 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 Migraines   . NASH (nonalcoholic steatohepatitis)   . PUD (peptic ulcer disease)   . GERD 04/04/2007    no per pt  . Heart murmur   . Back pain     Past Surgical History  Procedure Laterality Date  . Vasectomy    . Electrocardiogram  02/21/2006  . Colonoscopy      History   Social History  . Marital Status: Married    Spouse Name: N/A    Number of Children: N/A  . 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 Smoking status: Light Tobacco Smoker    Types: Cigars  . Smokeless tobacco: Never Used  . Alcohol Use: Yes     Comment: very occasionally  . Drug Use: No  . Sexual Activity: Not on file   Other Topics Concern  . Not on file   Social History Narrative  . No narrative on file    Current Outpatient Prescriptions on File Prior to Visit  Medication Sig Dispense Refill  . aspirin 81 MG tablet Take 81 mg by mouth daily.      . Cyclobenzaprine HCl (FLEXERIL PO) Take by mouth. As needed      . losartan-hydrochlorothiazide (HYZAAR) 50-12.5 MG per tablet Take 1 tablet by mouth daily.  90 tablet  3  . lovastatin (MEVACOR) 40 MG tablet Take 1 tablet (40 mg total) by mouth at bedtime.  90 tablet  1  . meloxicam (MOBIC) 15 MG tablet Take 15 mg by mouth daily.      Marland Kitchen MOVIPREP 100 G SOLR       . promethazine (PHENERGAN) 25 MG tablet Take 1 tablet (25 mg total) by mouth every 8 (eight) hours as needed for nausea.  20 tablet   11  . SUMAtriptan (IMITREX) 100 MG tablet Take 1 tablet (100 mg total) by mouth every 2 (two) hours as needed for migraine. Do not exceed 2 per day  10 tablet  11  . traMADol (ULTRAM) 50 MG tablet Take 1 tablet (50 mg total) by mouth every 8 (eight) hours as needed for pain.  90 tablet  0  . zolpidem (AMBIEN) 10 MG tablet Take 1 tablet (10 mg total) by mouth at bedtime as needed for sleep.  30 tablet  1   No current facility-administered medications on file prior to visit.    No Known Allergies  Family History  Problem Relation Age of Onset  . Heart attack Mother     MI  . Cancer Neg Hx     no cancer in immediate family  . Colon cancer Neg Hx   . Esophageal cancer Neg Hx   . Rectal cancer Neg Hx   . Stomach cancer Neg Hx     BP 126/70  Pulse 78  Ht 5\' 9"  (1.753 m)  Wt 233 lb (105.688 kg)  BMI 34.39 kg/m2  SpO2 98%  Review  of Systems  Constitutional: Negative for unexpected weight change.  HENT: Negative for hearing loss.   Eyes: Negative for visual disturbance.  Respiratory: Negative for shortness of breath.   Cardiovascular: Negative for chest pain.  Gastrointestinal: Negative for anal bleeding.  Endocrine: Negative for cold intolerance.  Genitourinary: Negative for hematuria and difficulty urinating.  Musculoskeletal:       Back pain is much better  Skin: Negative for rash.  Allergic/Immunologic: Positive for environmental allergies.  Neurological: Negative for syncope.  Psychiatric/Behavioral: Negative for dysphoric mood.       Objective:   Physical Exam VS: see vs page GEN: no distress HEAD: head: no deformity eyes: no periorbital swelling, no proptosis external nose and ears are normal mouth: no lesion seen NECK: supple, thyroid is not enlarged CHEST WALL: no deformity LUNGS: clear to auscultation BREASTS:  No gynecomastia CV: reg rate and rhythm, no murmur ABD: abdomen is soft, nontender.  no hepatosplenomegaly.  not distended.  no hernia.   RECTAL:  normal external and internal exam.  heme neg. PROSTATE:  Normal size.  No nodule MUSCULOSKELETAL: muscle bulk and strength are grossly normal.  no obvious joint swelling.  gait is normal and steady EXTEMITIES: no deformity.  no ulcer on the feet.  feet are of normal color and temp.   PULSES: dorsalis pedis intact bilat.  no carotid bruit NEURO:  cn 2-12 grossly intact.   readily moves all 4's.  sensation is intact to touch on the feet SKIN:  Normal texture and temperature.  No rash or suspicious lesion is visible.   NODES:  None palpable at the neck PSYCH: alert, oriented x3.  Does not appear anxious nor depressed.   i reviewed electrocardiogram: no signif change    Assessment & Plan:  Wellness visit today, with problems stable, except as noted.    SEPARATE EVALUATION FOLLOWS--EACH PROBLEM HERE IS NEW, NOT RESPONDING TO TREATMENT, OR POSES SIGNIFICANT RISK TO THE PATIENT'S HEALTH: HISTORY OF THE PRESENT ILLNESS: The state of at least three ongoing medical problems is addressed today, with interval history of each noted here: He stopped clomid.  He says libido is normal, though.   ED sxs persist.  cialis does not help, and he cannot afford it. Allergic rhinitis: He has nasal congestion. PAST MEDICAL HISTORY reviewed and up to date today REVIEW OF SYSTEMS: Denies fever and numbness PHYSICAL EXAMINATION: VITAL SIGNS:  See vs page GENERAL: no distress Ext: trace bilat leg edema LAB/XRAY RESULTS: Lab Results  Component Value Date   TESTOSTERONE 246.26* 11/20/2012  IMPRESSION: Hypogonadism: he needs to resume rx ED: he should try a different, cheaper rx Allergic rhinitis. Pt declines flonase. PLAN: See instruction page

## 2013-02-24 ENCOUNTER — Other Ambulatory Visit: Payer: Self-pay | Admitting: Endocrinology

## 2013-02-26 ENCOUNTER — Other Ambulatory Visit: Payer: Self-pay | Admitting: *Deleted

## 2013-02-26 MED ORDER — LOVASTATIN 40 MG PO TABS: 40.0000 mg | ORAL_TABLET | Freq: Every day | ORAL | Status: DC

## 2013-04-27 ENCOUNTER — Ambulatory Visit (INDEPENDENT_AMBULATORY_CARE_PROVIDER_SITE_OTHER): Payer: Managed Care, Other (non HMO) | Admitting: Family Medicine

## 2013-04-27 VITALS — BP 130/80 | HR 79 | Temp 98.3°F | Resp 16 | Ht 69.0 in | Wt 233.0 lb

## 2013-04-27 DIAGNOSIS — R6889 Other general symptoms and signs: Secondary | ICD-10-CM

## 2013-04-27 DIAGNOSIS — R05 Cough: Secondary | ICD-10-CM

## 2013-04-27 DIAGNOSIS — R059 Cough, unspecified: Secondary | ICD-10-CM

## 2013-04-27 DIAGNOSIS — J029 Acute pharyngitis, unspecified: Secondary | ICD-10-CM

## 2013-04-27 DIAGNOSIS — J069 Acute upper respiratory infection, unspecified: Secondary | ICD-10-CM

## 2013-04-27 LAB — POCT INFLUENZA A/B
Influenza A, POC: NEGATIVE
Influenza B, POC: NEGATIVE

## 2013-04-27 LAB — POCT RAPID STREP A (OFFICE): Rapid Strep A Screen: NEGATIVE

## 2013-04-27 MED ORDER — HYDROCODONE-HOMATROPINE 5-1.5 MG/5ML PO SYRP: 5.0000 mL | ORAL_SOLUTION | Freq: Three times a day (TID) | ORAL | Status: DC | PRN

## 2013-04-27 MED ORDER — AMOXICILLIN-POT CLAVULANATE 875-125 MG PO TABS: 1.0000 | ORAL_TABLET | Freq: Two times a day (BID) | ORAL | Status: DC

## 2013-04-27 MED ORDER — BENZONATATE 200 MG PO CAPS: 200.0000 mg | ORAL_CAPSULE | Freq: Three times a day (TID) | ORAL | Status: DC | PRN

## 2013-04-27 NOTE — Patient Instructions (Signed)

## 2013-04-27 NOTE — Progress Notes (Signed)
Chief Complaint:  Chief Complaint  Patient presents with  . Cough    Green mucus x 2 days  . Sore Throat    HPI:  Ruben West is a 58 y.o. male who is here for  cough,sore throat, green color nasal congestion, headache,achy, chills x 3 days. Pt states it hurts to cough, vomited this am for cough. Have taken OTC Robitussin without releif. Pt states he has had sxs about 3 years ago which was similar. + green sputum. + PND and itchy throat. His wife was recently here an although was tested negative for flu she was was given tamiflu. 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   Past Medical History  Diagnosis Date  . Hypercholesteremia   . ALLERGIC RHINITIS 11/23/2006  . HYPERTENSION 11/23/2006  . HYPERGLYCEMIA 04/04/2007  . FATTY LIVER DISEASE 05/09/2008  . 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 Migraines   . NASH (nonalcoholic steatohepatitis)   . PUD (peptic ulcer disease)   . GERD 04/04/2007    no per pt  . Heart murmur   . Back pain    Past Surgical History  Procedure Laterality Date  . Vasectomy    . Electrocardiogram  02/21/2006  . Colonoscopy     History   Social History  . Marital Status: Married    Spouse Name: N/A    Number of Children: N/A  . 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 Smoking status: Never Smoker   . Smokeless tobacco: Never Used  . Alcohol Use: Yes     Comment: very occasionally  . Drug Use: No  . Sexual Activity: None   Other Topics Concern  . None   Social History Narrative  . None   Family History  Problem Relation Age of Onset  . Heart attack Mother     MI  . Cancer Neg Hx     no cancer in immediate family  . Colon cancer Neg Hx   . Esophageal cancer Neg Hx   . Rectal cancer Neg Hx   . Stomach cancer Neg Hx    No Known Allergies Prior to Admission medications     Medication Sig Start Date End Date Taking? Authorizing Provider  aspirin 81 MG tablet Take 81 mg by mouth daily.   Yes Historical Provider, MD  Cyclobenzaprine HCl (FLEXERIL PO) Take by mouth. As needed   Yes Historical Provider, MD  losartan-hydrochlorothiazide (HYZAAR) 50-12.5 MG per tablet Take 1 tablet by mouth daily. 05/22/12  Yes Renato Shin, MD  lovastatin (MEVACOR) 40 MG tablet Take 1 tablet (40 mg total) by mouth at bedtime. 02/26/13  Yes Renato Shin, MD  meloxicam (MOBIC) 15 MG tablet Take 15 mg by mouth daily.   Yes Historical Provider, MD  promethazine (PHENERGAN) 25 MG tablet Take 1 tablet (25 mg total) by mouth every 8 (eight) hours as needed for nausea. 06/30/12  Yes Renato Shin, MD  sildenafil (REVATIO) 20 MG tablet 3-5 pills as needed for ED symptoms 11/20/12  Yes Renato Shin, MD  SUMAtriptan (IMITREX) 100 MG tablet Take 1 tablet (100 mg total) by mouth every 2 (two) hours as needed for migraine. Do not exceed 2 per day 06/30/12  Yes Renato Shin, MD  traMADol (ULTRAM) 50 MG tablet Take 1 tablet (50 mg total)  by mouth every 8 (eight) hours as needed for pain. 12/22/11  Yes Shawnee Knapp, MD  clomiPHENE (CLOMID) 50 MG tablet 1/4 tablet by mouth daily 11/20/12   Renato Shin, MD  MOVIPREP 100 G SOLR  07/27/12   Historical Provider, MD  zolpidem (AMBIEN) 10 MG tablet Take 1 tablet (10 mg total) by mouth at bedtime as needed for sleep. 05/22/12 06/21/12  Renato Shin, MD     ROS: The patient denies fevers, chills, night sweats, unintentional weight loss, chest pain, palpitations, wheezing, dyspnea on exertion,  abdominal pain, dysuria, hematuria, melena, numbness, weakness, or tingling.   All other systems have been reviewed and were otherwise negative with the exception of those mentioned in the HPI and as above.    PHYSICAL EXAM: Filed Vitals:   04/27/13 0808  BP: 130/80  Pulse: 79  Temp: 98.3 F (36.8 C)  Resp: 16  Spo2 96% Filed Vitals:   04/27/13 0808  Height: 5\' 9"  (1.753 m)   Weight: 233 lb (105.688 kg)   Body mass index is 34.39 kg/(m^2).  General: Alert, no acute distress HEENT:  Normocephalic, atraumatic, oropharynx patent. EOMI, PERRLA. TM nl, no exudates, + erythematous tonsils Cardiovascular:  Regular rate and rhythm, no rubs murmurs or gallops.  No Carotid bruits, radial pulse intact. No pedal edema.  Respiratory: Clear to auscultation bilaterally.  No wheezes, rales, or rhonchi.  No cyanosis, no use of accessory musculature GI: No organomegaly, abdomen is soft and non-tender, positive bowel sounds.  No masses. Skin: No rashes. Neurologic: Facial musculature symmetric. Psychiatric: Patient is appropriate throughout our interaction. Lymphatic: No cervical lymphadenopathy Musculoskeletal: Gait intact.   LABS: Results for orders placed in visit on 04/27/13  POCT INFLUENZA A/B      Result Value Range   Influenza A, POC Negative     Influenza B, POC Negative    POCT RAPID STREP A (OFFICE)      Result Value Range   Rapid Strep A Screen Negative  Negative     EKG/XRAY:   Primary read interpreted by Dr. Marin Comment at [facility].   ASSESSMENT/PLAN: Encounter Diagnoses  Name Primary?  . Flu-like symptoms   . Acute pharyngitis   . Cough   . Acute URI Yes   Augmentin 875 BID x 10 days if sxs do not improve Rx Hycodan Hx Tessalon Cepachol and Nasacort F/u prn Work note given  Gross sideeffects, risk and benefits, and alternatives of medications d/w patient. Patient is aware that all medications have potential sideeffects and we are unable to predict every sideeffect or drug-drug interaction that may occur.  LE, Blackwell, DO 04/27/2013 9:40 AM

## 2013-06-27 ENCOUNTER — Encounter: Payer: Self-pay | Admitting: Endocrinology

## 2013-06-27 ENCOUNTER — Ambulatory Visit (INDEPENDENT_AMBULATORY_CARE_PROVIDER_SITE_OTHER): Payer: Managed Care, Other (non HMO) | Admitting: Endocrinology

## 2013-06-27 VITALS — BP 132/80 | HR 86 | Temp 98.8°F | Ht 69.0 in | Wt 233.0 lb

## 2013-06-27 DIAGNOSIS — J111 Influenza due to unidentified influenza virus with other respiratory manifestations: Secondary | ICD-10-CM

## 2013-06-27 MED ORDER — OSELTAMIVIR PHOSPHATE 75 MG PO CAPS: 75.0000 mg | ORAL_CAPSULE | Freq: Two times a day (BID) | ORAL | Status: DC

## 2013-06-27 NOTE — Patient Instructions (Signed)
i have sent a prescription to your pharmacy, for "tamiflu."  I hope you feel better soon.  If you don't feel better by next week, please call back.  Please call sooner if you get worse.   Take tylenol, and drink plenty of fluids.

## 2013-06-27 NOTE — Progress Notes (Signed)
Subjective:    Patient ID: Ruben West, male    DOB: Aug 31, 1955, 58 y.o.   MRN: [id number]  HPI Pt states 1 day of moderate myalgias throughout the body, and assoc fever.  He has nasal congestion.   Past Medical History  Diagnosis Date  . Hypercholesteremia   . ALLERGIC RHINITIS 11/23/2006  . HYPERTENSION 11/23/2006  . HYPERGLYCEMIA 04/04/2007  . FATTY LIVER DISEASE 05/09/2008  . 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 Migraines   . NASH (nonalcoholic steatohepatitis)   . PUD (peptic ulcer disease)   . GERD 04/04/2007    no per pt  . Heart murmur   . Back pain     Past Surgical History  Procedure Laterality Date  . Vasectomy    . Electrocardiogram  02/21/2006  . Colonoscopy      History   Social History  . Marital Status: Married    Spouse Name: N/A    Number of Children: N/A  . 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 Smoking status: Never Smoker   . Smokeless tobacco: Never Used  . Alcohol Use: Yes     Comment: very occasionally  . Drug Use: No  . Sexual Activity: Not on file   Other Topics Concern  . Not on file   Social History Narrative  . No narrative on file    Current Outpatient Prescriptions on File Prior to Visit  Medication Sig Dispense Refill  . amoxicillin-clavulanate (AUGMENTIN) 875-125 MG per tablet Take 1 tablet by mouth 2 (two) times daily.  20 tablet  0  . aspirin 81 MG tablet Take 81 mg by mouth daily.      . benzonatate (TESSALON) 200 MG capsule Take 1 capsule (200 mg total) by mouth 3 (three) times daily as needed for cough.  30 capsule  1  . clomiPHENE (CLOMID) 50 MG tablet 1/4 tablet by mouth daily  25 tablet  3  . Cyclobenzaprine HCl (FLEXERIL PO) Take by mouth. As needed      . HYDROcodone-homatropine (HYCODAN) 5-1.5 MG/5ML syrup Take 5 mLs by mouth every 8 (eight) hours as needed for cough.  120 mL  0  . losartan-hydrochlorothiazide  (HYZAAR) 50-12.5 MG per tablet Take 1 tablet by mouth daily.  90 tablet  3  . lovastatin (MEVACOR) 40 MG tablet Take 1 tablet (40 mg total) by mouth at bedtime.  90 tablet  1  . meloxicam (MOBIC) 15 MG tablet Take 15 mg by mouth daily.      Marland Kitchen MOVIPREP 100 G SOLR       . promethazine (PHENERGAN) 25 MG tablet Take 1 tablet (25 mg total) by mouth every 8 (eight) hours as needed for nausea.  20 tablet  11  . sildenafil (REVATIO) 20 MG tablet 3-5 pills as needed for ED symptoms  30 tablet  10  . SUMAtriptan (IMITREX) 100 MG tablet Take 1 tablet (100 mg total) by mouth every 2 (two) hours as needed for migraine. Do not exceed 2 per day  10 tablet  11  . zolpidem (AMBIEN) 10 MG tablet Take 1 tablet (10 mg total) by mouth at bedtime as needed for sleep.  30 tablet  1   No current facility-administered medications on file prior to visit.    No Known Allergies  Family History  Problem Relation Age of Onset  . Heart attack Mother  MI  . Cancer Neg Hx     no cancer in immediate family  . Colon cancer Neg Hx   . Esophageal cancer Neg Hx   . Rectal cancer Neg Hx   . Stomach cancer Neg Hx     BP 132/80  Pulse 86  Temp(Src) 98.8 F (37.1 C) (Oral)  Ht 5\' 9"  (1.753 m)  Wt 233 lb (105.688 kg)  BMI 34.39 kg/m2  SpO2 97%    Review of Systems Denies earache, but he has a slight dry cough.      Objective:   Physical Exam VITAL SIGNS:  See vs page GENERAL: no distress head: no deformity eyes: no periorbital swelling, no proptosis external nose and ears are normal mouth: no lesion seen Both eac's and tm's are normal LUNGS:  Clear to auscultation.        Assessment & Plan:  Flu-like illness, new

## 2013-07-18 ENCOUNTER — Other Ambulatory Visit: Payer: Self-pay | Admitting: Endocrinology

## 2013-07-25 ENCOUNTER — Other Ambulatory Visit: Payer: Self-pay | Admitting: Endocrinology

## 2013-07-28 ENCOUNTER — Other Ambulatory Visit: Payer: Self-pay | Admitting: Endocrinology

## 2013-08-08 ENCOUNTER — Telehealth: Payer: Self-pay

## 2013-08-08 MED ORDER — LOSARTAN POTASSIUM-HCTZ 50-12.5 MG PO TABS: ORAL_TABLET | ORAL | Status: DC

## 2013-08-08 NOTE — Telephone Encounter (Signed)
rx for losartan sent for pharmacy.

## 2013-09-27 ENCOUNTER — Other Ambulatory Visit: Payer: Self-pay | Admitting: Endocrinology

## 2013-11-28 ENCOUNTER — Encounter: Payer: Self-pay | Admitting: Gastroenterology

## 2013-12-21 ENCOUNTER — Emergency Department (INDEPENDENT_AMBULATORY_CARE_PROVIDER_SITE_OTHER): Admission: EM | Admit: 2013-12-21 | Discharge: 2013-12-21 | Payer: Worker's Compensation | Source: Home / Self Care

## 2013-12-21 DIAGNOSIS — S4980XA Other specified injuries of shoulder and upper arm, unspecified arm, initial encounter: Secondary | ICD-10-CM

## 2013-12-21 DIAGNOSIS — S0990XA Unspecified injury of head, initial encounter: Secondary | ICD-10-CM

## 2013-12-21 DIAGNOSIS — S46909A Unspecified injury of unspecified muscle, fascia and tendon at shoulder and upper arm level, unspecified arm, initial encounter: Secondary | ICD-10-CM

## 2013-12-21 DIAGNOSIS — S4992XA Unspecified injury of left shoulder and upper arm, initial encounter: Secondary | ICD-10-CM

## 2013-12-21 DIAGNOSIS — S060X1A Concussion with loss of consciousness of 30 minutes or less, initial encounter: Secondary | ICD-10-CM

## 2013-12-21 DIAGNOSIS — S0993XA Unspecified injury of face, initial encounter: Secondary | ICD-10-CM

## 2013-12-21 DIAGNOSIS — S199XXA Unspecified injury of neck, initial encounter: Secondary | ICD-10-CM

## 2013-12-21 NOTE — ED Provider Notes (Signed)
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  He presented to [facility] with papers from his employer for evaluation and treatment of injury this afternoon. He walked in the door himself and was checked in our receptionist.--There was no initial complaint of loss of consciousness or posterior neck pain, but he was taken back immediately to exam room for evaluation and treatment.   Patient is a 58 y.o. male presenting with head injury. The history is provided by the patient.  Head Injury Location:  Generalized Time since incident:  2 hours Mechanism of injury comment:  While it was raining heavily, he accidentally slipped and fell from his truck cab onto hard pavement Pain details:    Quality:  Throbbing, stabbing and sharp   Severity:  Severe   Timing:  Constant   Progression:  Worsening Chronicity:  New Relieved by:  Nothing Worsened by:  Movement Associated symptoms: blurred vision, focal weakness (Left upper extremity), headache, loss of consciousness, nausea, neck pain (Posterior and left lateral) and numbness (left upper extremity)   Associated symptoms: no difficulty breathing, no double vision, no memory loss, no seizures, no tinnitus and no vomiting    During initial history/triage taking by nurse, patient stated that he had lost consciousness after he fell on the pavement. He's not sure how long he was unconscious.  He complains of severe pain from posterior neck along the left neck to left shoulder left elbow and left hand. It's difficult for him to move his left upper extremity without pain. It feels weak to him.  Past Medical History  Diagnosis Date  . Hypercholesteremia   . ALLERGIC RHINITIS 11/23/2006  . HYPERTENSION 11/23/2006  . HYPERGLYCEMIA 04/04/2007  . FATTY LIVER DISEASE 05/09/2008  . 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 Migraines   .  NASH (nonalcoholic steatohepatitis)   . PUD (peptic ulcer disease)   . GERD 04/04/2007    no per pt  . Heart murmur   . Back pain    Past Surgical History  Procedure Laterality Date  . Vasectomy    . Electrocardiogram  02/21/2006  . Colonoscopy     Family History  Problem Relation Age of Onset  . Heart attack Mother     MI  . Cancer Neg Hx     no cancer in immediate family  . Colon cancer Neg Hx   . Esophageal cancer Neg Hx   . Rectal cancer Neg Hx   . Stomach cancer Neg Hx    History  Substance Use Topics  . Smoking status: Never Smoker   . Smokeless tobacco: Never Used  . Alcohol Use: Yes     Comment: very occasionally    Review of Systems  Constitutional: Positive for fatigue. Negative for fever.  HENT: Negative for tinnitus.   Eyes: Positive for blurred vision. Negative for double vision.  Respiratory: Negative for shortness of breath.   Cardiovascular: Negative for chest pain.  Gastrointestinal: Positive for nausea. Negative for vomiting.  Musculoskeletal: Positive for neck pain (Posterior and left lateral).  Neurological: Positive for focal weakness (Left upper extremity), loss of consciousness, numbness (left upper extremity) and headaches. Negative for seizures.  Psychiatric/Behavioral: Negative for hallucinations and memory loss.  All other systems reviewed and are negative.  See also history of present illness  Allergies  Review of patient's allergies indicates no known allergies.  Home  Medications   Prior to Admission medications   Medication Sig Start Date End Date Taking? Authorizing Provider  amoxicillin-clavulanate (AUGMENTIN) 875-125 MG per tablet Take 1 tablet by mouth 2 (two) times daily. 04/27/13   Thao P Le, DO  aspirin 81 MG tablet Take 81 mg by mouth daily.    Historical Provider, MD  benzonatate (TESSALON) 200 MG capsule Take 1 capsule (200 mg total) by mouth 3 (three) times daily as needed for cough. 04/27/13   Thao P Le, DO  clomiPHENE  (CLOMID) 50 MG tablet 1/4 tablet by mouth daily 11/20/12   Renato Shin, MD  Cyclobenzaprine HCl (FLEXERIL PO) Take by mouth. As needed    Historical Provider, MD  HYDROcodone-homatropine (HYCODAN) 5-1.5 MG/5ML syrup Take 5 mLs by mouth every 8 (eight) hours as needed for cough. 04/27/13   Thao P Le, DO  losartan-hydrochlorothiazide (HYZAAR) 50-12.5 MG per tablet TAKE 1 TABLET DAILY 08/08/13   Renato Shin, MD  lovastatin (MEVACOR) 40 MG tablet TAKE 1 TABLET AT BEDTIME 09/27/13   Renato Shin, MD  meloxicam (MOBIC) 15 MG tablet Take 15 mg by mouth daily.    Historical Provider, MD  MOVIPREP [place]  07/27/12   Historical Provider, MD  oseltamivir (TAMIFLU) 75 MG capsule Take 1 capsule (75 mg total) by mouth 2 (two) times daily. 06/27/13   Renato Shin, MD  promethazine (PHENERGAN) 25 MG tablet Take 1 tablet (25 mg total) by mouth every 8 (eight) hours as needed for nausea. 06/30/12   Renato Shin, MD  sildenafil (REVATIO) 20 MG tablet 3-5 pills as needed for ED symptoms 11/20/12   Renato Shin, MD  SUMAtriptan (IMITREX) 100 MG tablet TAKE 1 TABLET BY MOUTH EVERY 2 HOURS AS NEEDED FOR MIGRAINE *DO NOT EXCEED 2 PER DAY    Renato Shin, MD   Physical Exam  Nursing note and vitals reviewed. Constitutional: He is oriented to person, place, and time. He appears well-developed and well-nourished. He is cooperative. He appears distressed.  He is alert, cooperative, sitting in chair, holding himself while giving history. He splints his neck and left upper extremity, avoiding movement which could cause pain. Initial blood pressure right arm 176/151. Rechecked after 5 minutes 128/89. Pulse ox 97% on room-air. Pulse 80, regular. He reports that he is 230 pounds, 68 inches tall.  HENT:  Head: Normocephalic and atraumatic.  Eyes: Conjunctivae and EOM are normal. Pupils are equal, round, and reactive to light. No scleral icterus.  Fundoscopic exam:      The right eye shows no papilledema.       The left eye shows  no papilledema.  Neck: Spinous process tenderness and muscular tenderness present.  No open wound. Range of motion was not tested as patient was kept immobilized.  Cardiovascular: Normal rate, S1 normal and S2 normal.  Exam reveals no gallop.   No murmur heard. Pulmonary/Chest: Effort normal and breath sounds normal.  Abdominal: He exhibits no distension. There is no tenderness.  Musculoskeletal:       Left shoulder: He exhibits decreased range of motion, tenderness and bony tenderness.       Left elbow: He exhibits decreased range of motion. Tenderness found.       Left wrist: He exhibits decreased range of motion and bony tenderness.  Neurological: He is alert and oriented to person, place, and time. No cranial nerve deficit.  Weakness left upper extremity. Decreased sensation left upper extremity.--Testing difficult because of pain.  Skin: Skin is warm. No rash  noted.  Psychiatric: He has a normal mood and affect.  Alert. Oriented x3    ED Course  Procedures (including critical care time) Labs Review Labs Reviewed - No data to display  Imaging Review No results found.   MDM   1. Closed head injury, initial encounter   2. Neck injury, initial encounter   3. Shoulder injury, left, initial encounter   4. Concussion with loss of consciousness, 30 minutes or less, initial encounter    After initial nurse triage, she immediately called me into exam room to see the patient emergently. Because of potential emergent injuries, we immediately called EMS and stayed with patient to keep him immobilized. His wife arrived and we allowed her to come into exam room to be with her husband until EMS arrived. When EMS arrived, he was immediately put in to a cervical immobilizer for transport to [facility] emergency room. Nurse and physician stayed with him during his entire stay until he was transported by EMS. Over 30 minutes spent, greater than 50% of the time spent for  coordination of care.      Jacqulyn Cane, MD 12/21/13 2026

## 2014-01-12 ENCOUNTER — Emergency Department (HOSPITAL_COMMUNITY): Payer: Managed Care, Other (non HMO)

## 2014-01-12 ENCOUNTER — Encounter (HOSPITAL_COMMUNITY): Payer: Self-pay | Admitting: Emergency Medicine

## 2014-01-12 ENCOUNTER — Observation Stay (HOSPITAL_COMMUNITY)
Admission: EM | Admit: 2014-01-12 | Discharge: 2014-01-13 | Disposition: A | Discharge status: Home / Self Care | Payer: Managed Care, Other (non HMO) | Attending: Internal Medicine | Admitting: Internal Medicine

## 2014-01-12 DIAGNOSIS — E785 Hyperlipidemia, unspecified: Secondary | ICD-10-CM | POA: Diagnosis present

## 2014-01-12 DIAGNOSIS — R079 Chest pain, unspecified: Secondary | ICD-10-CM

## 2014-01-12 DIAGNOSIS — I10 Essential (primary) hypertension: Secondary | ICD-10-CM | POA: Diagnosis not present

## 2014-01-12 DIAGNOSIS — G43109 Migraine with aura, not intractable, without status migrainosus: Secondary | ICD-10-CM | POA: Diagnosis present

## 2014-01-12 DIAGNOSIS — R200 Anesthesia of skin: Secondary | ICD-10-CM | POA: Diagnosis not present

## 2014-01-12 DIAGNOSIS — K219 Gastro-esophageal reflux disease without esophagitis: Secondary | ICD-10-CM | POA: Insufficient documentation

## 2014-01-12 DIAGNOSIS — G459 Transient cerebral ischemic attack, unspecified: Secondary | ICD-10-CM

## 2014-01-12 DIAGNOSIS — K7689 Other specified diseases of liver: Secondary | ICD-10-CM | POA: Diagnosis not present

## 2014-01-12 DIAGNOSIS — N528 Other male erectile dysfunction: Secondary | ICD-10-CM | POA: Diagnosis not present

## 2014-01-12 DIAGNOSIS — R011 Cardiac murmur, unspecified: Secondary | ICD-10-CM | POA: Diagnosis not present

## 2014-01-12 DIAGNOSIS — Z8711 Personal history of peptic ulcer disease: Secondary | ICD-10-CM | POA: Insufficient documentation

## 2014-01-12 DIAGNOSIS — R208 Other disturbances of skin sensation: Secondary | ICD-10-CM

## 2014-01-12 DIAGNOSIS — K7581 Nonalcoholic steatohepatitis (NASH): Secondary | ICD-10-CM | POA: Diagnosis not present

## 2014-01-12 DIAGNOSIS — E780 Pure hypercholesterolemia: Secondary | ICD-10-CM | POA: Diagnosis not present

## 2014-01-12 DIAGNOSIS — R29810 Facial weakness: Secondary | ICD-10-CM | POA: Diagnosis present

## 2014-01-12 DIAGNOSIS — G43909 Migraine, unspecified, not intractable, without status migrainosus: Secondary | ICD-10-CM | POA: Insufficient documentation

## 2014-01-12 DIAGNOSIS — R0789 Other chest pain: Secondary | ICD-10-CM | POA: Diagnosis present

## 2014-01-12 DIAGNOSIS — E669 Obesity, unspecified: Secondary | ICD-10-CM | POA: Diagnosis present

## 2014-01-12 LAB — CBC WITH DIFFERENTIAL/PLATELET
Basophils Absolute: 0 10*3/uL (ref 0.0–0.1)
Basophils Relative: 0 % (ref 0–1)
EOS PCT: 2 % (ref 0–5)
Eosinophils Absolute: 0.2 10*3/uL (ref 0.0–0.7)
HEMATOCRIT: 43.9 % (ref 39.0–52.0)
Hemoglobin: 15.8 g/dL (ref 13.0–17.0)
Lymphocytes Relative: 37 % (ref 12–46)
Lymphs Abs: 2.9 10*3/uL (ref 0.7–4.0)
MCH: 30.6 pg (ref 26.0–34.0)
MCHC: 36 g/dL (ref 30.0–36.0)
MCV: 85.1 fL (ref 78.0–100.0)
MONO ABS: 0.6 10*3/uL (ref 0.1–1.0)
Monocytes Relative: 8 % (ref 3–12)
Neutro Abs: 4 10*3/uL (ref 1.7–7.7)
Neutrophils Relative %: 53 % (ref 43–77)
Platelets: 213 10*3/uL (ref 150–400)
RBC: 5.16 MIL/uL (ref 4.22–5.81)
RDW: 12.5 % (ref 11.5–15.5)
WBC: 7.7 10*3/uL (ref 4.0–10.5)

## 2014-01-12 LAB — COMPREHENSIVE METABOLIC PANEL
ALT: 25 U/L (ref 0–53)
AST: 20 U/L (ref 0–37)
Albumin: 3.9 g/dL (ref 3.5–5.2)
Alkaline Phosphatase: 74 U/L (ref 39–117)
Anion gap: 12 (ref 5–15)
BUN: 18 mg/dL (ref 6–23)
CO2: 26 mEq/L (ref 19–32)
Calcium: 9.4 mg/dL (ref 8.4–10.5)
Chloride: 103 mEq/L (ref 96–112)
Creatinine, Ser: 1.16 mg/dL (ref 0.50–1.35)
GFR calc Af Amer: 78 mL/min — ABNORMAL LOW (ref >90)
GFR calc non Af Amer: 68 mL/min — ABNORMAL LOW (ref >90)
Glucose, Bld: 101 mg/dL — ABNORMAL HIGH (ref 70–99)
Potassium: 3.9 mEq/L (ref 3.7–5.3)
Sodium: 141 mEq/L (ref 137–147)
TOTAL PROTEIN: 7.4 g/dL (ref 6.0–8.3)
Total Bilirubin: 0.4 mg/dL (ref 0.3–1.2)

## 2014-01-12 LAB — I-STAT TROPONIN, ED: Troponin i, poc: 0.01 ng/mL (ref 0.00–0.08)

## 2014-01-12 LAB — TROPONIN I

## 2014-01-12 MED ORDER — LOSARTAN POTASSIUM-HCTZ 50-12.5 MG PO TABS: 1.0000 | ORAL_TABLET | ORAL | Status: DC

## 2014-01-12 MED ORDER — ASPIRIN 325 MG PO TABS
325.0000 mg | ORAL_TABLET | Freq: Every day | ORAL | Status: DC
  Administered 2014-01-13 (×2): 325 mg via ORAL
  Filled 2014-01-12 (×2): qty 1

## 2014-01-12 MED ORDER — PRAVASTATIN SODIUM 40 MG PO TABS
40.0000 mg | ORAL_TABLET | Freq: Every day | ORAL | Status: DC
  Filled 2014-01-12: qty 1

## 2014-01-12 MED ORDER — STROKE: EARLY STAGES OF RECOVERY BOOK
Freq: Once | Status: AC
Start: 2014-01-12 — End: 2014-01-12
  Administered 2014-01-12: 23:00:00
  Filled 2014-01-12: qty 1

## 2014-01-12 MED ORDER — HYDROCHLOROTHIAZIDE 12.5 MG PO CAPS
12.5000 mg | ORAL_CAPSULE | Freq: Every day | ORAL | Status: DC
  Administered 2014-01-13 (×2): 12.5 mg via ORAL
  Filled 2014-01-12 (×2): qty 1

## 2014-01-12 MED ORDER — ASPIRIN 81 MG PO CHEW
162.0000 mg | CHEWABLE_TABLET | Freq: Once | ORAL | Status: AC
  Administered 2014-01-12: 162 mg via ORAL
  Filled 2014-01-12: qty 2

## 2014-01-12 MED ORDER — ENOXAPARIN SODIUM 40 MG/0.4ML ~~LOC~~ SOLN
40.0000 mg | SUBCUTANEOUS | Status: DC
  Administered 2014-01-13: 40 mg via SUBCUTANEOUS
  Filled 2014-01-12 (×2): qty 0.4

## 2014-01-12 MED ORDER — LOSARTAN POTASSIUM 50 MG PO TABS
50.0000 mg | ORAL_TABLET | Freq: Every day | ORAL | Status: DC
  Administered 2014-01-13: 50 mg via ORAL
  Filled 2014-01-12: qty 1

## 2014-01-12 NOTE — H&P (Signed)
[facility] Admission History and Physical       [name] NWG:[id number] DOB: 1955-08-14 DOA: 01/12/2014  Referring physician:  EDP PCP: Renato Shin, MD  Specialists:   Chief Complaint: Numbness Right Face and ARM  HPI: Ruben West is a 58 y.o. male with a history of HTN and Hyperlipidemia who presents to the ED with complaints of intermittent episodes of numbness of his right face and right arm x 3 days.  He has had  10/10 Headache and neck pain associated with his symptoms.  He has also had an episode of chest pain 1 day ago and he denies having nausea or vomiting or diaphoresis associated with his chest pian.   He was reading about his symptoms on Web MD and decided that he needed to come to the hospital to be evaluated.  In the ED,  a Ct scan of the head was performed and was negative for acute findings, and an initial troponin level was also negative.  Neurology was consulted and he was seen by Dr Leonel Ramsay and he was referred for further risk stratification of a TIA.     Review of Systems:  Constitutional: No Weight Loss, No Weight Gain, Night Sweats, Fevers, Chills, Dizziness, Fatigue, or Generalized Weakness HEENT: No Headaches, Difficulty Swallowing,Tooth/Dental Problems,Sore Throat,  No Sneezing, Rhinitis, Ear Ache, Nasal Congestion, or Post Nasal Drip,  Cardio-vascular:  +Chest pain, Orthopnea, PND, Edema in Lower Extremities, Anasarca, Dizziness, Palpitations  Resp: No Dyspnea, No DOE, No Productive Cough, No Non-Productive Cough, No Hemoptysis, No Wheezing.    GI: No Heartburn, Indigestion, Abdominal Pain, Nausea, Vomiting, Diarrhea, Hematemesis, Hematochezia, Melena, Change in Bowel Habits,  Loss of Appetite  GU: No Dysuria, Change in Color of Urine, No Urgency or Frequency, No Flank pain.  Musculoskeletal: No Joint Pain or Swelling, No Decreased Range of Motion, No Back Pain.  Neurologic: No Syncope, No Seizures, Muscle Weakness, +Paresthesia of Right  Face and ARM, Vision Disturbance or Loss, No Diplopia, No Vertigo, No Difficulty Walking,  Skin: No Rash or Lesions. Psych: No Change in Mood or Affect, No Depression or Anxiety, No Memory loss, No Confusion, or Hallucinations   Past Medical History  Diagnosis Date  . Hypercholesteremia   . ALLERGIC RHINITIS 11/23/2006  . HYPERTENSION 11/23/2006  . HYPERGLYCEMIA 04/04/2007  . FATTY LIVER DISEASE 05/09/2008  . 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 Migraines   . NASH (nonalcoholic steatohepatitis)   . PUD (peptic ulcer disease)   . GERD 04/04/2007    no per pt  . Heart murmur   . Back pain      Past Surgical History  Procedure Laterality Date  . Vasectomy    . Electrocardiogram  02/21/2006  . Colonoscopy        Prior to Admission medications   Medication Sig Start Date End Date Taking? Authorizing Provider  aspirin EC 81 MG tablet Take 81 mg by mouth every morning.   Yes Historical Provider, MD  losartan-hydrochlorothiazide (HYZAAR) 50-12.5 MG per tablet Take 1 tablet by mouth every morning.   Yes Historical Provider, MD  lovastatin (MEVACOR) 40 MG tablet Take 40 mg by mouth at bedtime.   Yes Historical Provider, MD  sildenafil (REVATIO) 20 MG tablet Take 60-100 mg by mouth as needed (for ED symptoms).   Yes Historical Provider, MD  SUMAtriptan (IMITREX) 100 MG tablet Take 100 mg by mouth every 2 (two) hours as needed for migraine or headache. May repeat in  2 hours if headache persists or recurs. NO MORE THAN 2 TABLETS IN 24 HOURS   Yes Historical Provider, MD     No Known Allergies   Social History:  reports that he has never smoked. He has never used smokeless tobacco. He reports that he drinks alcohol. He reports that he does not use illicit drugs.     Family History  Problem Relation Age of Onset  . Heart attack Mother     MI  . Cancer Neg Hx     no cancer in immediate family  . Colon cancer Neg Hx   . Esophageal cancer  Neg Hx   . Rectal cancer Neg Hx   . Stomach cancer Neg Hx        Physical Exam:  GEN:  Pleasant Well Nourished and Well Developed 58 y.o. Caucasian male examined  and in no acute distress; cooperative with exam Filed Vitals:   01/12/14 1700 01/12/14 1845 01/12/14 1900 01/12/14 1938  BP: 136/96 141/87 130/84 143/93  Pulse: 62 65 67 63  Temp:      TempSrc:      Resp: 15 22 24 15   SpO2: 97% 96% 96% 97%   Blood pressure 143/93, pulse 63, temperature 97.6 F (36.4 C), temperature source Oral, resp. rate 15, SpO2 97.00%. PSYCH: He is alert and oriented x4; does not appear anxious does not appear depressed; affect is normal HEENT: Normocephalic and Atraumatic, Mucous membranes pink; PERRLA; EOM intact; Fundi:  Benign;  No scleral icterus, Nares: Patent, Oropharynx: Clear, Fair Dentition,    Neck:  FROM, No Cervical Lymphadenopathy nor Thyromegaly or Carotid Bruit; No JVD; Breasts:: Not examined CHEST WALL: No tenderness CHEST: Normal respiration, clear to auscultation bilaterally HEART: Regular rate and rhythm; no murmurs rubs or gallops BACK: No kyphosis or scoliosis; No CVA tenderness ABDOMEN: Positive Bowel Sounds, Soft Non-Tender; No Masses, No Organomegaly. Rectal Exam: Not done EXTREMITIES: No Cyanosis, Clubbing, or Edema; No Ulcerations. Genitalia: not examined PULSES: 2+ and symmetric SKIN: Normal hydration no rash or ulceration  CNS:   Mental Status:  Alert, Oriented, Thought Content Appropriate. Speech Fluent without evidence of Aphasia. Able to follow 3 step commands without difficulty.  In No obvious pain.   Cranial Nerves:  II: Discs flat bilaterally; Visual fields Intact, Pupils equal and reactive.    III,IV, VI: Extra-ocular motions intact bilaterally    V,VII: smile symmetric, facial light touch sensation normal bilaterally    VIII: hearing intact bilaterally    IX,X: gag reflex present    XI: bilateral shoulder shrug    XII: midline tongue extension   Motor:    Right:  Upper extremity 5/5     Left:  Upper extremity 5/5     Right:  Lower extremity 5/5    Left:  Lower extremity 5/5     Tone and Bulk:  normal tone throughout; no atrophy noted   Sensory:  Pinprick and light touch intact throughout, bilaterally   Deep Tendon Reflexes: 2+ and symmetric throughout   Plantars/ Babinski:  Right: normal Left: normal    Cerebellar:  Finger to nose without difficulty.   Gait: deferred   Vascular: pulses palpable throughout    Labs on Admission:  Basic Metabolic Panel:  Recent Labs Lab 01/12/14 1623  NA 141  K 3.9  CL 103  CO2 26  GLUCOSE 101*  BUN 18  CREATININE 1.16  CALCIUM 9.4   Liver Function Tests:  Recent Labs Lab 01/12/14 1623  AST 20  ALT 25  ALKPHOS 74  BILITOT 0.4  PROT 7.4  ALBUMIN 3.9   No results found for this basename: LIPASE, AMYLASE,  in the last 168 hours No results found for this basename: AMMONIA,  in the last 168 hours CBC:  Recent Labs Lab 01/12/14 1623  WBC 7.7  NEUTROABS 4.0  HGB 15.8  HCT 43.9  MCV 85.1  PLT 213   Cardiac Enzymes: No results found for this basename: CKTOTAL, CKMB, CKMBINDEX, TROPONINI,  in the last 168 hours  BNP (last 3 results) No results found for this basename: PROBNP,  in the last 8760 hours CBG: No results found for this basename: GLUCAP,  in the last 168 hours  Radiological Exams on Admission: Dg Chest 2 View  01/12/2014   CLINICAL DATA:  Right facial droop. Right arm pain. Central anterior chest pain.  EXAM: CHEST  2 VIEW  COMPARISON:  10/30/2011.  FINDINGS: Normal sized heart. Clear lungs. Minimal thoracic spine degenerative changes.  IMPRESSION: No acute abnormality.   Electronically Signed   By: Enrique Sack M.D.   On: 01/12/2014 17:44   Ct Head Wo Contrast  01/12/2014   CLINICAL DATA:  Right facial numbness and right arm pain for 3 days.  EXAM: CT HEAD WITHOUT CONTRAST  TECHNIQUE: Contiguous axial images were obtained from the base of the skull through the  vertex without intravenous contrast.  COMPARISON:  Brain MRI 03/25/2012.  FINDINGS: The brain appears normal without hemorrhage, infarct, mass lesion, mass effect, midline shift or abnormal extra-axial fluid collection. There is no hydrocephalus or pneumocephalus. The calvarium is intact. Imaged paranasal sinuses and mastoid air cells are clear.  IMPRESSION: Negative head CT.   Electronically Signed   By: Inge Rise M.D.   On: 01/12/2014 17:40     EKG: Independently reviewed. Normal Sinus Rhythm at rate of  71, No Acute Changes   Assessment/Plan:   58 y.o. male with   Principal Problem:   1.   TIA (transient ischemic attack)   Telemetry Monitoring, Neuro checks   MRI/MRA of Brain, Carotid US, and 2D ECHO in AM   Check Fasting Lipids and HbA1C in AM   ASA,and Statin Rx   Seen By Neurology   Active Problems:   2.   Numbness Right Face and ARM   TIA vs CVA workup and Risk Factor Stratification     3.   Atypical chest pain    Telemetry Monitoring   Cycle Troponins   Continue ARB, ASA, and Statin Rx     4.   Essential hypertension   Continue Losartan/HCTZ rx   Monitor BPs     5.   Hyperlipidemia   Continue Statin Rx   Checking Fasting Lipids in AM     6.   DVT Prophylaxis   Lovenox     Code Status:   FULL CODE   Family Communication:    No Family Present Disposition Plan:   Observation/ Telemetry      Time spent:  60 MInutes  Theressa Millard [facility] Pager [phone]   If [place] Please Contact the Day Rounding Team MD for [facility]  If 7PM-7AM, Please Contact Night-Floor Coverage  [email or web address] Password [email or web address] 01/12/2014, 8:06 PM

## 2014-01-12 NOTE — Consult Note (Signed)
Stroke Consult    Chief Complaint: right sided numbness  HPI: Ruben West is an 58 y.o. male with past medical history of hypertension & high cholesterol who comes in with right face numbness. Patient states he's had these symptoms intermittently since 3 days ago. He states that he feels the right side of his face is drooping too, wife does endorse an intermittent mild droop.  Numbness is intermittent, longest episode lasted around 60 minutes. Denies any weakness, trouble walking.  He states he had a similar event 5-6 years ago where is seen the hospital and discharged. Intermittent headache as well but does have history of migraines states feels similar.   Takes ASA 81mg  daily at home. States BP is well controlled.    Date last known well: 01/09/2014 Time last known well: 2100 tPA Given: no, outside window  Past Medical History  Diagnosis Date  . Hypercholesteremia   . ALLERGIC RHINITIS 11/23/2006  . HYPERTENSION 11/23/2006  . HYPERGLYCEMIA 04/04/2007  . FATTY LIVER DISEASE 05/09/2008  . 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 Migraines   . NASH (nonalcoholic steatohepatitis)   . PUD (peptic ulcer disease)   . GERD 04/04/2007    no per pt  . Heart murmur   . Back pain     Past Surgical History  Procedure Laterality Date  . Vasectomy    . Electrocardiogram  02/21/2006  . Colonoscopy      Family History  Problem Relation Age of Onset  . Heart attack Mother     MI  . Cancer Neg Hx     no cancer in immediate family  . Colon cancer Neg Hx   . Esophageal cancer Neg Hx   . Rectal cancer Neg Hx   . Stomach cancer Neg Hx    Social History:  reports that he has never smoked. He has never used smokeless tobacco. He reports that he drinks alcohol. He reports that he does not use illicit drugs.  Allergies: No Known Allergies   (Not in a hospital admission)  ROS: Out of a complete 14 system review, the patient complains of only the  following symptoms, and all other reviewed systems are negative.  Physical Examination: Blood pressure 130/84, pulse 67, temperature 97.6 F (36.4 C), temperature source Oral, resp. rate 24, SpO2 96.00%.  Neurologic Examination: Mental Status: Alert, oriented, thought content appropriate.  Speech fluent without evidence of aphasia.  Able to follow 3 step commands without difficulty. Cranial Nerves: II: funduscopic exam wnl bilaterally, visual fields grossly normal, pupils equal, round, reactive to light and accommodation III,IV, VI: ptosis not present, extra-ocular motions intact bilaterally V,VII: smile symmetric, decreased LT on right side of face VIII: hearing normal bilaterally IX,X: gag reflex present XI: trapezius strength/neck flexion strength normal bilaterally XII: tongue strength normal  Motor: Right : Upper extremity    Left:     Upper extremity 5/5 deltoid       5/5 deltoid 5/5 biceps      5/5 biceps  5/5 triceps      5/5 triceps 5/5wrist flexion     5/5 wrist flexion 5/5 wrist extension     5/5 wrist extension 5/5 hand grip      5/5 hand grip  Lower extremity     Lower extremity 5-/5 hip flexor      5/5 hip flexor 5-/5 quadricep      5/5 quadriceps  5/5 hamstrings     5/5 hamstrings 5/5 plantar  flexion       5/5 plantar flexion 5/5 plantar extension     5/5 plantar extension Tone and bulk:normal tone throughout; no atrophy noted Sensory: Pinprick and light touch minimally decreased right side compared to left Deep Tendon Reflexes: 2+ and symmetric throughout Plantars: Right: downgoing   Left: downgoing Cerebellar: normal finger-to-nose, normal rapid alternating movements and normal heel-to-shin test Gait: normal gait and station  Laboratory Studies:   Basic Metabolic Panel:  Recent Labs Lab 01/12/14 1623  NA 141  K 3.9  CL 103  CO2 26  GLUCOSE 101*  BUN 18  CREATININE 1.16  CALCIUM 9.4    Liver Function Tests:  Recent Labs Lab 01/12/14 1623   AST 20  ALT 25  ALKPHOS 74  BILITOT 0.4  PROT 7.4  ALBUMIN 3.9   No results found for this basename: LIPASE, AMYLASE,  in the last 168 hours No results found for this basename: AMMONIA,  in the last 168 hours  CBC:  Recent Labs Lab 01/12/14 1623  WBC 7.7  NEUTROABS 4.0  HGB 15.8  HCT 43.9  MCV 85.1  PLT 213    Cardiac Enzymes: No results found for this basename: CKTOTAL, CKMB, CKMBINDEX, TROPONINI,  in the last 168 hours  BNP: No components found with this basename: POCBNP,   CBG: No results found for this basename: GLUCAP,  in the last 168 hours  Microbiology: No results found for this or any previous visit.  Coagulation Studies: No results found for this basename: LABPROT, INR,  in the last 72 hours  Urinalysis: No results found for this basename: COLORURINE, APPERANCEUR, LABSPEC, PHURINE, GLUCOSEU, HGBUR, BILIRUBINUR, KETONESUR, PROTEINUR, UROBILINOGEN, NITRITE, LEUKOCYTESUR,  in the last 168 hours  Lipid Panel:     Component Value Date/Time   CHOL 144 06/30/2012 1021   TRIG 222.0* 06/30/2012 1021   HDL 37.90* 06/30/2012 1021   CHOLHDL 4 06/30/2012 1021   VLDL 44.4* 06/30/2012 1021   LDLCALC 59 07/12/2011 1132    HgbA1C:  Lab Results  Component Value Date   HGBA1C 5.1 06/30/2012    Urine Drug Screen:   No results found for this basename: labopia, cocainscrnur, labbenz, amphetmu, thcu, labbarb    Alcohol Level: No results found for this basename: ETH,  in the last 168 hours  Other results:  Imaging: Dg Chest 2 View  01/12/2014   CLINICAL DATA:  Right facial droop. Right arm pain. Central anterior chest pain.  EXAM: CHEST  2 VIEW  COMPARISON:  10/30/2011.  FINDINGS: Normal sized heart. Clear lungs. Minimal thoracic spine degenerative changes.  IMPRESSION: No acute abnormality.   Electronically Signed   By: Enrique Sack M.D.   On: 01/12/2014 17:44   Ct Head Wo Contrast  01/12/2014   CLINICAL DATA:  Right facial numbness and right arm pain for 3 days.  EXAM:  CT HEAD WITHOUT CONTRAST  TECHNIQUE: Contiguous axial images were obtained from the base of the skull through the vertex without intravenous contrast.  COMPARISON:  Brain MRI 03/25/2012.  FINDINGS: The brain appears normal without hemorrhage, infarct, mass lesion, mass effect, midline shift or abnormal extra-axial fluid collection. There is no hydrocephalus or pneumocephalus. The calvarium is intact. Imaged paranasal sinuses and mastoid air cells are clear.  IMPRESSION: Negative head CT.   Electronically Signed   By: Inge Rise M.D.   On: 01/12/2014 17:40    Assessment: 58 y.o. male with history of HTN and HLD presenting for intermittent episodes of right sided sensory loss.  Exam pertinent for mild right sided sensory change and minimal proximal RLE weakness. Will work up for infarct, suspect possible small vessel disease.  Stroke Risk Factors -HTN, HLD  Plan: 1. HgbA1c, fasting lipid panel 2. MRI, MRA  of the brain without contrast 3. PT consult, OT consult, Speech consult 4. Echocardiogram 5. Carotid dopplers 6. Prophylactic therapy-increase ASA to 325mg  7. Risk factor modification 8. Telemetry monitoring 9. Frequent neuro checks   Jim Like, DO [facility] [phone]  If 7pm- 7am, please page neurology on call as listed in [place]. 01/12/2014, 7:13 PM

## 2014-01-12 NOTE — ED Provider Notes (Signed)
CSN: [id number]     Arrival date & time 01/12/14  1601 History   First MD Initiated Contact with Patient 01/12/14 1628     Chief Complaint  Patient presents with  . Facial Droop     (Consider location/radiation/quality/duration/timing/severity/associated sxs/prior Treatment) HPI Comments: Patient is a 58 year old white male with past medical history of hypertension & high cholesterol who comes in with right face numbness. Patient states he's had these symptoms intermittently since 3 days ago. He states that he face his face is drooping but he has not noticed any true. States the numbness intermittent and does not know what makes it better or makes it worse. He also complains of some and his right leg as well. Denies any weakness, trouble walking, abdominal pain, nausea vomiting diarrhea. Does have some intermittent chest pain states as well states it hurts for a few seconds and then goes away. He states he had a similar event 5-6 years ago where is seen the hospital and discharged. Intermittent headache as well but does have history of migraines states feels similar.   Past Medical History  Diagnosis Date  . Hypercholesteremia   . ALLERGIC RHINITIS 11/23/2006  . HYPERTENSION 11/23/2006  . HYPERGLYCEMIA 04/04/2007  . FATTY LIVER DISEASE 05/09/2008  . 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 Migraines   . NASH (nonalcoholic steatohepatitis)   . PUD (peptic ulcer disease)   . GERD 04/04/2007    no per pt  . Heart murmur   . Back pain    Past Surgical History  Procedure Laterality Date  . Vasectomy    . Electrocardiogram  02/21/2006  . Colonoscopy     Family History  Problem Relation Age of Onset  . Heart attack Mother     MI  . Cancer Neg Hx     no cancer in immediate family  . Colon cancer Neg Hx   . Esophageal cancer Neg Hx   . Rectal cancer Neg Hx   . Stomach cancer Neg Hx    History  Substance Use Topics  . Smoking status: Never  Smoker   . Smokeless tobacco: Never Used  . Alcohol Use: Yes     Comment: very occasionally    Review of Systems  Constitutional: Negative for fever, activity change and appetite change.  HENT: Negative for congestion and rhinorrhea.   Eyes: Negative for discharge and itching.  Respiratory: Negative for cough, shortness of breath and wheezing.   Cardiovascular: Positive for chest pain.  Gastrointestinal: Negative for nausea, vomiting, abdominal pain, diarrhea and constipation.  Genitourinary: Negative for hematuria, decreased urine volume and difficulty urinating.  Skin: Negative for rash and wound.  Neurological: Positive for numbness and headaches. Negative for syncope and weakness.  All other systems reviewed and are negative.     Allergies  Review of patient's allergies indicates no known allergies.  Home Medications    BP 143/93  Pulse 63  Temp(Src) 97.6 F (36.4 C) (Oral)  Resp 15  SpO2 97% Physical Exam  Vitals reviewed. Constitutional: He is oriented to person, place, and time. He appears well-developed and well-nourished. No distress.  HENT:  Head: Normocephalic and atraumatic.  Mouth/Throat: Oropharynx is clear and moist. No oropharyngeal exudate.  Eyes: Conjunctivae and EOM are normal. Pupils are equal, round, and reactive to light. Right eye exhibits no discharge. Left eye exhibits no discharge. No scleral icterus.  Neck: Normal range of motion. Neck supple.  Cardiovascular: Normal rate, regular rhythm, normal heart  sounds and intact distal pulses.  Exam reveals no gallop and no friction rub.   No murmur heard. Pulmonary/Chest: Effort normal and breath sounds normal. No respiratory distress. He has no wheezes. He has no rales.  Abdominal: Soft. He exhibits no distension and no mass. There is no tenderness.  Musculoskeletal: Normal range of motion.  Neurological: He is alert and oriented to person, place, and time. No cranial nerve deficit. He exhibits normal  muscle tone. Coordination normal.  4+/5 in right lower extremity 5/5 in all other extremities Diminished sensation right lower extremity Intact sensation in all other extremities 1+ DTRs bilateral patellar 2+ DTRs bilateral brachioradialis Ambulatory without ataxia  Skin: Skin is warm. No rash noted. He is not diaphoretic.    ED Course  Procedures (including critical care time) Labs Review Labs Reviewed  COMPREHENSIVE METABOLIC PANEL - Abnormal; Notable for the following:    Glucose, Bld 101 (*)    GFR calc non Af Amer 68 (*)    GFR calc Af Amer 78 (*)    All other components within normal limits  CBC WITH DIFFERENTIAL  Randolm Idol, ED    Imaging Review Dg Chest 2 View  01/12/2014   CLINICAL DATA:  Right facial droop. Right arm pain. Central anterior chest pain.  EXAM: CHEST  2 VIEW  COMPARISON:  10/30/2011.  FINDINGS: Normal sized heart. Clear lungs. Minimal thoracic spine degenerative changes.  IMPRESSION: No acute abnormality.   Electronically Signed   By: Enrique Sack M.D.   On: 01/12/2014 17:44   Ct Head Wo Contrast  01/12/2014   CLINICAL DATA:  Right facial numbness and right arm pain for 3 days.  EXAM: CT HEAD WITHOUT CONTRAST  TECHNIQUE: Contiguous axial images were obtained from the base of the skull through the vertex without intravenous contrast.  COMPARISON:  Brain MRI 03/25/2012.  FINDINGS: The brain appears normal without hemorrhage, infarct, mass lesion, mass effect, midline shift or abnormal extra-axial fluid collection. There is no hydrocephalus or pneumocephalus. The calvarium is intact. Imaged paranasal sinuses and mastoid air cells are clear.  IMPRESSION: Negative head CT.   Electronically Signed   By: Inge Rise M.D.   On: 01/12/2014 17:40     EKG Interpretation   Date/Time:  Saturday January 12 2014 16:12:25 EDT Ventricular Rate:  71 PR Interval:  176 QRS Duration: 84 QT Interval:  410 QTC Calculation: 445 R Axis:   70 Text Interpretation:   Normal sinus rhythm Septal infarct , age  undetermined Similar to previous Confirmed by ZAVITZ  MD, JOSHUA ([id number]) on  01/12/2014 6:06:48 PM      MDM   MDM: 58 year old man comes in with right face and right lower extremity numbness. Intermittent chest pain. Right leg numb and weak on exam. Head CT negative. His any code stroke criteria as symptoms started 3 days ago. Troponin negative. EKG without ischemic change. Neuro consult and agrees with TIA or stroke. Will admit to hospitalist for further workup of stroke and chest pain. No chest pain while in ED. No decline in neurological status.  Final diagnoses:  Right facial numbness  Right leg numbness  Nonspecific chest pain   Admit to Hospitalist  Sol Passer, MD 01/12/14 1943

## 2014-01-12 NOTE — ED Notes (Addendum)
Pt reports facial numbness/drooping ongoing since Thursday. Pt also c/o chest discomfort on Thursday with "RT arm aching." Reports CP resolved, but continues to have aching to right arm.  Pt also c/o of 10/10 HA, denies vision changes. Pt denies SOB, n/v. Reports hx of migraines and states, "this feels different." Denies CP at this time.  Grips equal, no arm drift, small droop noted to RT side of mouth. States similar episode q 5 years ago and was not diagnosed with anything. NAD

## 2014-01-12 NOTE — ED Notes (Signed)
Resident at bedside.  

## 2014-01-12 NOTE — ED Notes (Signed)
EKG performed in triage

## 2014-01-13 ENCOUNTER — Encounter (HOSPITAL_COMMUNITY): Payer: Self-pay

## 2014-01-13 ENCOUNTER — Other Ambulatory Visit: Payer: Self-pay | Admitting: Neurology

## 2014-01-13 ENCOUNTER — Observation Stay (HOSPITAL_COMMUNITY): Payer: Managed Care, Other (non HMO)

## 2014-01-13 DIAGNOSIS — G43909 Migraine, unspecified, not intractable, without status migrainosus: Secondary | ICD-10-CM

## 2014-01-13 DIAGNOSIS — E669 Obesity, unspecified: Secondary | ICD-10-CM | POA: Diagnosis present

## 2014-01-13 DIAGNOSIS — G43109 Migraine with aura, not intractable, without status migrainosus: Secondary | ICD-10-CM

## 2014-01-13 DIAGNOSIS — G459 Transient cerebral ischemic attack, unspecified: Secondary | ICD-10-CM

## 2014-01-13 LAB — HEMOGLOBIN A1C
Hgb A1c MFr Bld: 5 % (ref <5.7)
Mean Plasma Glucose: 97 mg/dL (ref <117)

## 2014-01-13 LAB — GLUCOSE, CAPILLARY
GLUCOSE-CAPILLARY: 132 mg/dL — AB (ref 70–99)
Glucose-Capillary: 87 mg/dL (ref 70–99)
Glucose-Capillary: 100 mg/dL — ABNORMAL HIGH (ref 70–99)

## 2014-01-13 LAB — TROPONIN I: Troponin I: <0.3 ng/mL (ref <0.30)

## 2014-01-13 LAB — LIPID PANEL
Cholesterol: 137 mg/dL (ref 0–200)
HDL: 42 mg/dL (ref >39)
LDL Cholesterol: 70 mg/dL (ref 0–99)
TRIGLYCERIDES: 127 mg/dL (ref <150)
Total CHOL/HDL Ratio: 3.3 RATIO
VLDL: 25 mg/dL (ref 0–40)

## 2014-01-13 MED ORDER — TOPIRAMATE 50 MG PO TABS: ORAL_TABLET | ORAL | Status: DC

## 2014-01-13 MED ORDER — ACETAMINOPHEN 325 MG PO TABS
650.0000 mg | ORAL_TABLET | Freq: Four times a day (QID) | ORAL | Status: DC | PRN
  Administered 2014-01-13: 650 mg via ORAL
  Filled 2014-01-13: qty 2

## 2014-01-13 MED ORDER — ASPIRIN EC 81 MG PO TBEC: 81.0000 mg | DELAYED_RELEASE_TABLET | Freq: Every day | ORAL | Status: DC

## 2014-01-13 NOTE — Progress Notes (Signed)
STROKE TEAM PROGRESS NOTE   HISTORY Ruben West is a 58 y.o. male with past medical history of hypertension & high cholesterol who comes in with right face numbness. Patient states he's had these symptoms intermittently since 3 days ago. He states that he feels the right side of his face is drooping too, wife does endorse an intermittent mild droop. Numbness is intermittent, longest episode lasted around 60 minutes. Denies any weakness, trouble walking. He states he had a similar event 5-6 years ago where is seen the hospital and discharged. Intermittent headache as well but does have history of migraines states feels similar.  Takes ASA 81mg  daily at home. States BP is well controlled.   Date last known well: 01/09/2014  Time last known well: 2100  tPA Given: no, outside window   SUBJECTIVE (INTERVAL HISTORY) Patient's wife at the bedside. The patient has had intermittent right facial numbness with right arm numbness. He had total 3 episodes including one episode this morning. The headache is either proceeded or followed by headache at bi-frontal area, not his typical migraine headache which is more at the back of head. 10 days ago he had some perioral numbness associated with a headache. He has a history of migraine headaches, his last typical headache 3-4 years ago.     OBJECTIVE Temp:  [97.6 F (36.4 C)-98 F (36.7 C)] 98 F (36.7 C) (10/18 0944) Pulse Rate:  [60-74] 66 (10/18 0944) Cardiac Rhythm:  [-] Normal sinus rhythm (10/17 2030) Resp:  [15-24] 16 (10/18 0944) BP: (126-165)/(74-96) 138/90 mmHg (10/18 0944) SpO2:  [95 %-99 %] 99 % (10/18 0944) Weight:  [228 lb 14.4 oz (103.828 kg)] 228 lb 14.4 oz (103.828 kg) (10/18 0000)   Recent Labs Lab 01/13/14 0053 01/13/14 0748  GLUCAP 132* 87    Recent Labs Lab 01/12/14 1623  NA 141  K 3.9  CL 103  CO2 26  GLUCOSE 101*  BUN 18  CREATININE 1.16  CALCIUM 9.4    Recent Labs Lab 01/12/14 1623  AST 20  ALT 25   ALKPHOS 74  BILITOT 0.4  PROT 7.4  ALBUMIN 3.9    Recent Labs Lab 01/12/14 1623  WBC 7.7  NEUTROABS 4.0  HGB 15.8  HCT 43.9  MCV 85.1  PLT 213    Recent Labs Lab 01/12/14 2023 01/13/14 0249 01/13/14 0719  TROPONINI <0.30 <0.30 <0.30   No results found for this basename: LABPROT, INR,  in the last 72 hours No results found for this basename: COLORURINE, [place], LABSPEC, [place], GLUCOSEU, HGBUR, BILIRUBINUR, KETONESUR, PROTEINUR, UROBILINOGEN, NITRITE, LEUKOCYTESUR,  in the last 72 hours     Component Value Date/Time   CHOL 137 01/13/2014 0250   TRIG 127 01/13/2014 0250   HDL 42 01/13/2014 0250   CHOLHDL 3.3 01/13/2014 0250   VLDL 25 01/13/2014 0250   LDLCALC 70 01/13/2014 0250   Lab Results  Component Value Date   HGBA1C 5.1 06/30/2012   No results found for this basename: labopia, cocainscrnur, labbenz, amphetmu, thcu, labbarb    No results found for this basename: ETH,  in the last 168 hours  2-D echo 01/13/2014 - Left ventricle: The cavity size was normal. Wall thickness was normal. Systolic function was normal. The estimated ejection fraction was in the range of 55% to 60%. Wall motion was normal; there were no regional wall motion abnormalities. Left ventricular diastolic function parameters were normal. - Atrial septum: There was increased thickness of the septum, consistent with lipomatous hypertrophy. No  defect or patent foramen ovale was identified. - Pulmonic valve: There was trivial regurgitation.  Dg Chest 2 View 01/12/2014    No acute abnormality.    Ct Head Wo Contrast 01/12/2014    Negative head CT.    Mri Brain Without Contrast 01/13/2014    Normal MRI of the brain.   Mr Jodene Nam Head/brain Wo Cm 01/13/2014    Normal intracranial MR angiography of the large and medium size vessels.     CUS - pending   PHYSICAL EXAM  Temp:  [97.7 F (36.5 C)-98 F (36.7 C)] 98 F (36.7 C) (10/18 0944) Pulse Rate:  [60-68] 66 (10/18  0944) Resp:  [15-24] 16 (10/18 0944) BP: (126-159)/(74-93) 138/90 mmHg (10/18 0944) SpO2:  [95 %-99 %] 99 % (10/18 0944) Weight:  [228 lb 14.4 oz (103.828 kg)] 228 lb 14.4 oz (103.828 kg) (10/18 0000)  General - Well nourished, well developed, in no apparent distress.  Ophthalmologic - Sharp disc margins OU.  Cardiovascular - Regular rate and rhythm with no murmur.  Mental Status -  Level of arousal and orientation to time, place, and person were intact. Language including expression, naming, repetition, comprehension, reading, and writing was assessed and found intact. Attention span and concentration were normal. Recent and remote memory were intact. Fund of Knowledge was assessed and was intact.  Cranial Nerves II - XII - II - Visual field intact OU. III, IV, VI - Extraocular movements intact. V - Facial sensation intact bilaterally. VII - Facial movement intact bilaterally. VIII - Hearing & vestibular intact bilaterally. X - Palate elevates symmetrically. XI - Chin turning & shoulder shrug intact bilaterally. XII - Tongue protrusion intact.  Motor Strength - The patient's strength was normal in all extremities and pronator drift was absent.  Bulk was normal and fasciculations were absent.   Motor Tone - Muscle tone was assessed at the neck and appendages and was normal.  Reflexes - The patient's reflexes were normal in all extremities and he had no pathological reflexes.  Sensory - Light touch, temperature/pinprick, vibration and proprioception, and Romberg testing were assessed and were normal.    Coordination - The patient had normal movements in the hands and feet with no ataxia or dysmetria.  Tremor was absent.  Gait and Station - The patient's transfers, posture, gait, station, and turns were observed as normal   ASSESSMENT/PLAN Mr. Ruben West is a 58 y.o. male with history of hypertension, hypercholesterolemia, and migraine headaches presenting with  intermittent right facial numbness and drooping. He did not receive IV t-PA due to late presentation.  Perioral and left facial/arm numbness - likely complicated migraine, although TIA is still in the DDx     MRI  normal  MRA  normal  Carotid Doppler  pending  2D Echo  unremarkable  LDL 70 goal < 70  HgbA1c 5.1  Lovenox for VTE prophylaxis  Cardiac thin liquids  Bathroom privileges with assistance  aspirin 81 mg orally every day prior to admission, now on aspirin 81 mg orally every day  Patient counseled to be compliant with his antithrombotic medications  Risk factor education  Ongoing aggressive risk factor management  Therapy recommendations:  Pending  Disposition:  Okay to discharge when workup is complete.  Complicated migraine - has imitrex at home for abortive meds - add topamax for headache prophylactic therapy (may also help weight loss)    Hypertension  Home meds:   Losartan/hydrochlorothiazide  Stable  normotensive  Hyperlipidemia  Home meds:  Mevacor - pravastatin ordered while in the hospital  LDL 70, meet the goal < 70  Continue statin at discharge  Other Stroke Risk Factors   Obesity, Body mass index is 34.29 kg/(m^2). - exercise and weight loss recommended  Other Active Problems  Complicated migraine headaches   Hospital day # 1  Mikey Bussing PA-C [facility] Pager [phone] 01/13/2014, 1:53 PM  I, the attending vascular neurologist, have personally obtained a history, examined the patient, evaluated laboratory data, individually viewed imaging studies, and formulated the assessment and plan of care.  I have made any additions or clarifications directly to the above note and agree with the findings and plan as currently documented.   Rosalin Hawking, MD PhD Stroke Neurology 01/13/2014 5:40 PM   To contact Stroke Continuity provider, please refer to [email or web address] After hours, contact General Neurology

## 2014-01-13 NOTE — Progress Notes (Signed)
Utilization Review Completed.   Jaquita Bessire, RN, BSN Nurse Case Manager  

## 2014-01-13 NOTE — Progress Notes (Signed)
Echocardiogram 2D Echocardiogram has been performed.  Ruben West 01/13/2014, 9:44 AM

## 2014-01-13 NOTE — Discharge Summary (Signed)
Discharge Summary  Ruben West:[id number] DOB: 03/07/1956  PCP: Renato Shin, MD  Admit date: 01/12/2014 Discharge date: 01/13/2014  Time spent: 25 minutes  Recommendations for Outpatient Follow-up:  1. New medications: Topamax 50 mg by mouth daily x1 week then twice a day   Discharge Diagnoses:  Active Hospital Problems   Diagnosis Date Noted  . Migraine with aura 01/12/2014  . Obesity (BMI 30-39.9) 01/13/2014  . Numbness 01/12/2014  . Atypical chest pain 01/12/2014  . Hyperlipidemia 01/12/2014  . Essential hypertension 11/23/2006    Resolved Hospital Problems   Diagnosis Date Noted Date Resolved  No resolved problems to display.    Discharge Condition: Improved, being discharged home  Diet recommendation: Low-sodium  Filed Weights   01/12/14 2031 01/13/14 0000  Weight: 103.828 kg (228 lb 14.4 oz) 103.828 kg (228 lb 14.4 oz)    History of present illness:  Patient is a 58 year old male with past medical history of hypertension and hyperlipidemia as well as migraine headaches who presented to the emergency room on 10/17 with several days of intermittent episodes of numbness in the right side of his face with a question of facial droop as well as numbness of the right arm and headaches either preceding or following the symptoms. CT scan of the head was unremarkable. He was brought to the hospitalist service for further evaluation.  Hospital Course:  Principal Problem:   Migraine with aura: Initially suspected to be a TIA, stroke workup was done which was unrevealing. LDL at 70, A1c normal. CT scan and MRI were unrevealing. Echocardiogram noted no evidence of clot or systolic/diastolic failure. MRA noted no evidence of carotid artery stenosis. In further discussion with the patient, he had a few days ago of associated perioral numbness. In addition with his headaches preceding or following the symptoms, it was very strongly suspected that he had migraine aura and not  TIA. For migraine prevention, patient not felt to be a good candidate for propranolol given resting heart rate in the low 60s. Patient started on Topamax and felt to be safe for discharge. Active Problems:   Essential hypertension: Patient will continue on home medications   Numbness: Resolved, secondary to migraine with aura    Hyperlipidemia: LDL are under good control. Continue statin    Obesity (BMI 30-39.9): Patient needs criteria with BMI greater than 30. Advised to increase exercise. Topamax actually should help to lose some weight   Procedures:  Echocardiogram done 10/18: Preserved ejection fraction, normal  Consultations:  Neurology  Discharge Exam: BP 138/90  Pulse 66  Temp(Src) 98 F (36.7 C) (Oral)  Resp 16  Ht 5' 8.5" (1.74 m)  Wt 103.828 kg (228 lb 14.4 oz)  BMI 34.29 kg/m2  SpO2 99%  General: Alert and oriented x3, no acute distress Cardiovascular: Regular rate and rhythm, S1-S2 Respiratory: Clear to auscultation bilaterally  Discharge Instructions You were cared for by a hospitalist during your hospital stay. If you have any questions about your discharge medications or the care you received while you were in the hospital after you are discharged, you can call the unit and asked to speak with the hospitalist on call if the hospitalist that took care of you is not available. Once you are discharged, your primary care physician will handle any further medical issues. Please note that NO REFILLS for any discharge medications will be authorized once you are discharged, as it is imperative that you return to your primary care physician (or establish a relationship with  a primary care physician if you do not have one) for your aftercare needs so that they can reassess your need for medications and monitor your lab values.  Discharge Instructions   Diet - low sodium heart healthy    Complete by:  As directed      Increase activity slowly    Complete by:  As directed              Medication List         aspirin EC 81 MG tablet  Take 81 mg by mouth every morning.     losartan-hydrochlorothiazide 50-12.5 MG per tablet  Commonly known as:  HYZAAR  Take 1 tablet by mouth every morning.     lovastatin 40 MG tablet  Commonly known as:  MEVACOR  Take 40 mg by mouth at bedtime.     sildenafil 20 MG tablet  Commonly known as:  REVATIO  Take 60-100 mg by mouth as needed (for ED symptoms).     SUMAtriptan 100 MG tablet  Commonly known as:  IMITREX  Take 100 mg by mouth every 2 (two) hours as needed for migraine or headache. May repeat in 2 hours if headache persists or recurs. NO MORE THAN 2 TABLETS IN 24 HOURS     topiramate 50 MG tablet  Commonly known as:  TOPAMAX  1 tab po daily x 1 week, then 1 po bid       No Known Allergies    The results of significant diagnostics from this hospitalization (including imaging, microbiology, ancillary and laboratory) are listed below for reference.    Significant Diagnostic Studies: Dg Chest 2 View  01/12/2014   CLINICAL DATA:  Right facial droop. Right arm pain. Central anterior chest pain.  EXAM: CHEST  2 VIEW  COMPARISON:  10/30/2011.  FINDINGS: Normal sized heart. Clear lungs. Minimal thoracic spine degenerative changes.  IMPRESSION: No acute abnormality.   Electronically Signed   By: Enrique Sack M.D.   On: 01/12/2014 17:44   Ct Head Wo Contrast  01/12/2014   CLINICAL DATA:  Right facial numbness and right arm pain for 3 days.  EXAM: CT HEAD WITHOUT CONTRAST  TECHNIQUE: Contiguous axial images were obtained from the base of the skull through the vertex without intravenous contrast.  COMPARISON:  Brain MRI 03/25/2012.  FINDINGS: The brain appears normal without hemorrhage, infarct, mass lesion, mass effect, midline shift or abnormal extra-axial fluid collection. There is no hydrocephalus or pneumocephalus. The calvarium is intact. Imaged paranasal sinuses and mastoid air cells are clear.  IMPRESSION:  Negative head CT.   Electronically Signed   By: Inge Rise M.D.   On: 01/12/2014 17:40   Mri Brain Without Contrast  01/13/2014   CLINICAL DATA:  Initial encounter. Symptom duration 3 days. Right facial numbness and drooping.  EXAM: MRI HEAD WITHOUT CONTRAST  MRA HEAD WITHOUT CONTRAST  TECHNIQUE: Multiplanar, multiecho pulse sequences of the brain and surrounding structures were obtained without intravenous contrast. Angiographic images of the head were obtained using MRA technique without contrast.  COMPARISON:  Head CT 01/12/2014.  MRI 03/25/2012.  FINDINGS: MRI HEAD FINDINGS  The brain has a normal appearance on all pulse sequences without evidence of malformation, atrophy, old or acute infarction, mass lesion, hemorrhage, hydrocephalus or extra-axial collection. No pituitary mass. No fluid in the sinuses, middle ears or mastoids. No skull or skullbase lesion. There is flow in the major vessels at the base of the brain. Major venous sinuses show flow.  MRA HEAD FINDINGS  Both internal carotid arteries are widely patent into the brain. The anterior and middle cerebral vessels are normal without proximal stenosis, aneurysm or vascular malformation. Both vertebral arteries are widely patent to the basilar. No basilar stenosis. Posterior circulation branch vessels appear normal.  IMPRESSION: Normal MRI of the brain. Normal intracranial MR angiography of the large and medium size vessels.   Electronically Signed   By: Nelson Chimes M.D.   On: 01/13/2014 09:40   Mr Jodene Nam Head/brain Wo Cm  01/13/2014   CLINICAL DATA:  Initial encounter. Symptom duration 3 days. Right facial numbness and drooping.  EXAM: MRI HEAD WITHOUT CONTRAST  MRA HEAD WITHOUT CONTRAST  TECHNIQUE: Multiplanar, multiecho pulse sequences of the brain and surrounding structures were obtained without intravenous contrast. Angiographic images of the head were obtained using MRA technique without contrast.  COMPARISON:  Head CT 01/12/2014.  MRI  03/25/2012.  FINDINGS: MRI HEAD FINDINGS  The brain has a normal appearance on all pulse sequences without evidence of malformation, atrophy, old or acute infarction, mass lesion, hemorrhage, hydrocephalus or extra-axial collection. No pituitary mass. No fluid in the sinuses, middle ears or mastoids. No skull or skullbase lesion. There is flow in the major vessels at the base of the brain. Major venous sinuses show flow.  MRA HEAD FINDINGS  Both internal carotid arteries are widely patent into the brain. The anterior and middle cerebral vessels are normal without proximal stenosis, aneurysm or vascular malformation. Both vertebral arteries are widely patent to the basilar. No basilar stenosis. Posterior circulation branch vessels appear normal.  IMPRESSION: Normal MRI of the brain. Normal intracranial MR angiography of the large and medium size vessels.   Electronically Signed   By: Nelson Chimes M.D.   On: 01/13/2014 09:40    Microbiology: No results found for this or any previous visit (from the past 240 hour(s)).   Labs: Basic Metabolic Panel:  Recent Labs Lab 01/12/14 1623  NA 141  K 3.9  CL 103  CO2 26  GLUCOSE 101*  BUN 18  CREATININE 1.16  CALCIUM 9.4   Liver Function Tests:  Recent Labs Lab 01/12/14 1623  AST 20  ALT 25  ALKPHOS 74  BILITOT 0.4  PROT 7.4  ALBUMIN 3.9   No results found for this basename: LIPASE, AMYLASE,  in the last 168 hours No results found for this basename: AMMONIA,  in the last 168 hours CBC:  Recent Labs Lab 01/12/14 1623  WBC 7.7  NEUTROABS 4.0  HGB 15.8  HCT 43.9  MCV 85.1  PLT 213   Cardiac Enzymes:  Recent Labs Lab 01/12/14 2023 01/13/14 0249 01/13/14 0719  TROPONINI <0.30 <0.30 <0.30   BNP: BNP (last 3 results) No results found for this basename: PROBNP,  in the last 8760 hours CBG:  Recent Labs Lab 01/13/14 0053 01/13/14 0748 01/13/14 1107  GLUCAP 132* 87 100*       Signed:  Leoma Folds K  [facility] 01/13/2014, 5:14 PM

## 2014-01-13 NOTE — Progress Notes (Addendum)
Pt discharged to home per MD order. Pt received and reviewed all discharge instructions and medication information including follow-up appointments and prescription information. Pt verbalized understanding. Pt alert and oriented at discharge with no complaints of pain. Pt IV and telemetry box removed prior to discharge. Pt ambulated to private vehicle per pt request. Lenna Sciara

## 2014-01-13 NOTE — ED Provider Notes (Signed)
Medical screening examination/treatment/procedure(s) were conducted as a shared visit with non-physician practitioner(s) or resident and myself. I personally evaluated the patient during the encounter and agree with the findings.  I have personally reviewed any xrays and/ or EKG's with the provider and I agree with interpretation.  Patient with blood pressure, high cholesterol and ulcer history presents with right face and right leg numbness fairly constant for the past 2-3 days. Patient had a few brief episodes lasting 3 minutes anterior chest discomfort, no significant radiation, no chest pain or shortness breath currently, no heart attack or heart failure history. No exertional or diaphoretic symptoms. No history of similar. No history of stroke. No weakness however patient did have mild facial droop that resolved, right side. 5+ strength in UE and LE with f/e at major joints.  Sensation to palpation intact in UE and LE, subjective decreased right face and leg.  CNs 2-12 grossly intact. EOMFI. PERRL.  Finger nose and coordination intact bilateral.  Visual fields intact to finger testing.  Discussed with neurology who will assess the patient in ER, plan for observation for serial troponins and MRI of the brain.  Chest pain, Right facial numbness   Mariea Clonts, MD 01/13/14 [phone]

## 2014-02-08 ENCOUNTER — Other Ambulatory Visit: Payer: Self-pay | Admitting: Endocrinology

## 2014-02-08 NOTE — Telephone Encounter (Signed)
Please advise if ok to refill medication is listed under historical provider.

## 2014-02-08 NOTE — Telephone Encounter (Signed)
Rx sent 

## 2014-02-08 NOTE — Telephone Encounter (Signed)
Please refill x 1 cpx is due 

## 2014-02-09 ENCOUNTER — Emergency Department (INDEPENDENT_AMBULATORY_CARE_PROVIDER_SITE_OTHER): Payer: Worker's Compensation

## 2014-02-09 ENCOUNTER — Emergency Department (INDEPENDENT_AMBULATORY_CARE_PROVIDER_SITE_OTHER)
Admission: EM | Admit: 2014-02-09 | Discharge: 2014-02-09 | Disposition: A | Discharge status: Home / Self Care | Payer: Self-pay | Source: Home / Self Care | Attending: Family Medicine | Admitting: Family Medicine

## 2014-02-09 ENCOUNTER — Encounter: Payer: Self-pay | Admitting: Emergency Medicine

## 2014-02-09 DIAGNOSIS — M25562 Pain in left knee: Secondary | ICD-10-CM

## 2014-02-09 DIAGNOSIS — S8392XA Sprain of unspecified site of left knee, initial encounter: Secondary | ICD-10-CM

## 2014-02-09 DIAGNOSIS — S8992XA Unspecified injury of left lower leg, initial encounter: Secondary | ICD-10-CM

## 2014-02-09 MED ORDER — DICLOFENAC EPOLAMINE 1.3 % TD PTCH: 1.0000 | MEDICATED_PATCH | Freq: Two times a day (BID) | TRANSDERMAL | Status: DC

## 2014-02-09 MED ORDER — TRAMADOL HCL 50 MG PO TABS: 50.0000 mg | ORAL_TABLET | Freq: Every evening | ORAL | Status: DC | PRN

## 2014-02-09 NOTE — ED Notes (Addendum)
Patient had accident at work 12-21-13; continues to have pain in left knee and desires re-evaluation. Originally took ibuprofen 600mg  per day; now taking Goodies Powder.

## 2014-02-09 NOTE — Discharge Instructions (Signed)
Apply ice pack 3 to 4 times daily.   Cryotherapy Cryotherapy means treatment with cold. Ice or gel packs can be used to reduce both pain and swelling. Ice is the most helpful within the first 24 to 48 hours after an injury or flare-up from overusing a muscle or joint. Sprains, strains, spasms, burning pain, shooting pain, and aches can all be eased with ice. Ice can also be used when recovering from surgery. Ice is effective, has very few side effects, and is safe for most people to use. PRECAUTIONS  Ice is not a safe treatment option for people with:  Raynaud phenomenon. This is a condition affecting small blood vessels in the extremities. Exposure to cold may cause your problems to return.  Cold hypersensitivity. There are many forms of cold hypersensitivity, including:  Cold urticaria. Red, itchy hives appear on the skin when the tissues begin to warm after being iced.  Cold erythema. This is a red, itchy rash caused by exposure to cold.  Cold hemoglobinuria. Red blood cells break down when the tissues begin to warm after being iced. The hemoglobin that carry oxygen are passed into the urine because they cannot combine with blood proteins fast enough.  Numbness or altered sensitivity in the area being iced. If you have any of the following conditions, do not use ice until you have discussed cryotherapy with your caregiver:  Heart conditions, such as arrhythmia, angina, or chronic heart disease.  High blood pressure.  Healing wounds or open skin in the area being iced.  Current infections.  Rheumatoid arthritis.  Poor circulation.  Diabetes. Ice slows the blood flow in the region it is applied. This is beneficial when trying to stop inflamed tissues from spreading irritating chemicals to surrounding tissues. However, if you expose your skin to cold temperatures for too long or without the proper protection, you can damage your skin or nerves. Watch for signs of skin damage due to  cold. HOME CARE INSTRUCTIONS Follow these tips to use ice and cold packs safely.  Place a dry or damp towel between the ice and skin. A damp towel will cool the skin more quickly, so you may need to shorten the time that the ice is used.  For a more rapid response, add gentle compression to the ice.  Ice for no more than 10 to 20 minutes at a time. The bonier the area you are icing, the less time it will take to get the benefits of ice.  Check your skin after 5 minutes to make sure there are no signs of a poor response to cold or skin damage.  Rest 20 minutes or more between uses.  Once your skin is numb, you can end your treatment. You can test numbness by very lightly touching your skin. The touch should be so light that you do not see the skin dimple from the pressure of your fingertip. When using ice, most people will feel these normal sensations in this order: cold, burning, aching, and numbness.  Do not use ice on someone who cannot communicate their responses to pain, such as small children or people with dementia. HOW TO MAKE AN ICE PACK Ice packs are the most common way to use ice therapy. Other methods include ice massage, ice baths, and cryosprays. Muscle creams that cause a cold, tingly feeling do not offer the same benefits that ice offers and should not be used as a substitute unless recommended by your caregiver. To make an ice pack, do  one of the following:  Place crushed ice or a bag of frozen vegetables in a sealable plastic bag. Squeeze out the excess air. Place this bag inside another plastic bag. Slide the bag into a pillowcase or place a damp towel between your skin and the bag.  Mix 3 parts water with 1 part rubbing alcohol. Freeze the mixture in a sealable plastic bag. When you remove the mixture from the freezer, it will be slushy. Squeeze out the excess air. Place this bag inside another plastic bag. Slide the bag into a pillowcase or place a damp towel between your  skin and the bag. SEEK MEDICAL CARE IF:  You develop white spots on your skin. This may give the skin a blotchy (mottled) appearance.  Your skin turns blue or pale.  Your skin becomes waxy or hard.  Your swelling gets worse. MAKE SURE YOU:   Understand these instructions.  Will watch your condition.  Will get help right away if you are not doing well or get worse. Document Released: 11/09/2010 Document Revised: 07/30/2013 Document Reviewed: 11/09/2010 [facility] Patient Information 2015 [place], [place]. This information is not intended to replace advice given to you by your health care provider. Make sure you discuss any questions you have with your health care provider.

## 2014-02-09 NOTE — ED Provider Notes (Signed)
CSN: [id number]     Arrival date & time 02/09/14  1125 History   First MD Initiated Contact with Patient 02/09/14 1146     Chief Complaint  Patient presents with  . Knee Pain      HPI Comments: While at work on 12/21/2013, patient fell while climbing out of his truck, landing on head and shoulders.  He was evaluated at [facility] ER, where all x-ray studies were negative for fracture. He improved, except for persistent pain in his left knee.  Immediately after his injury he did not notice left knee pain and did not report it at the ER (and knee was therefore not evaluated). He complains of continued pain in his left anterior knee.  He has pain walking, and the knee occasionally gives way.  Two weeks ago his left knee "locked."  He tried using ice and a knee brace. He notes that he is able to continue his normal work duties.  Patient is a 58 y.o. male presenting with knee pain. The history is provided by the patient.  Knee Pain Location:  Knee Time since incident:  7 weeks Injury: yes   Mechanism of injury: fall   Fall:    Fall occurred: from truck cab steps.   Impact surface:  Concrete   Point of impact: left elbow, arm, and head.  Also left knee. Knee location:  L knee Pain details:    Quality:  Aching   Severity:  Mild   Onset quality:  Gradual   Duration:  7 weeks   Timing:  Constant   Progression:  Unchanged Chronicity:  New Dislocation: no   Prior injury to area:  No Relieved by:  Nothing Worsened by:  Bearing weight Ineffective treatments:  NSAIDs Associated symptoms: decreased ROM, stiffness and swelling   Associated symptoms: no back pain, no muscle weakness, no numbness and no tingling     Past Medical History  Diagnosis Date  . Hypercholesteremia   . ALLERGIC RHINITIS 11/23/2006  . HYPERTENSION 11/23/2006  . HYPERGLYCEMIA 04/04/2007  . FATTY LIVER DISEASE 05/09/2008  . 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 Migraines   .  NASH (nonalcoholic steatohepatitis)   . PUD (peptic ulcer disease)   . GERD 04/04/2007    no per pt  . Heart murmur   . Back pain    Past Surgical History  Procedure Laterality Date  . Vasectomy    . Electrocardiogram  02/21/2006  . Colonoscopy     Family History  Problem Relation Age of Onset  . Heart attack Mother     MI  . Cancer Neg Hx     no cancer in immediate family  . Colon cancer Neg Hx   . Esophageal cancer Neg Hx   . Rectal cancer Neg Hx   . Stomach cancer Neg Hx    History  Substance Use Topics  . Smoking status: Never Smoker   . Smokeless tobacco: Never Used  . Alcohol Use: Yes     Comment: very occasionally    Review of Systems  Musculoskeletal: Positive for stiffness. Negative for back pain.  All other systems reviewed and are negative.   Allergies  Review of patient's allergies indicates no known allergies.  Home Medications   Prior to Admission medications   Medication Sig Start Date End Date Taking? Authorizing Provider  aspirin EC 81 MG tablet Take 81 mg by mouth every morning.    Historical Provider, MD  diclofenac [facility])  1.3 % [facility] Place 1 patch onto the skin 2 (two) times daily. 02/09/14   Kandra Nicolas, MD  losartan-hydrochlorothiazide (HYZAAR) 50-12.5 MG per tablet Take 1 tablet daily. APPOINTMENT NEEDED FOR FURTHER REFILLS. 02/08/14   Renato Shin, MD  lovastatin (MEVACOR) 40 MG tablet Take 40 mg by mouth at bedtime.    Historical Provider, MD  sildenafil (REVATIO) 20 MG tablet Take 60-100 mg by mouth as needed (for ED symptoms).    Historical Provider, MD  SUMAtriptan (IMITREX) 100 MG tablet Take 100 mg by mouth every 2 (two) hours as needed for migraine or headache. May repeat in 2 hours if headache persists or recurs. *NO MORE THAN 2 TABLETS IN 24 HOURS    Historical Provider, MD  topiramate (TOPAMAX) 50 MG tablet 1 tab po daily x 1 week, then 1 po bid 01/13/14   Annita Brod, MD  traMADol (ULTRAM) 50 MG tablet Take 1 tablet (50  mg total) by mouth at bedtime as needed. 02/09/14   Kandra Nicolas, MD   BP 113/74 mmHg  Pulse 82  Temp(Src) 97.9 F (36.6 C) (Oral)  Resp 16  Ht 5' 8.5" (1.74 m)  Wt 221 lb (100.245 kg)  BMI 33.11 kg/m2  SpO2 96% Physical Exam  Constitutional: He is oriented to person, place, and time. He appears well-developed and well-nourished. No distress.  HENT:  Head: Atraumatic.  Eyes: Conjunctivae are normal. Pupils are equal, round, and reactive to light.  Musculoskeletal:       Left knee: He exhibits decreased range of motion. He exhibits no swelling, no effusion, no ecchymosis, no deformity, no laceration, no erythema, normal alignment, no LCL laxity, normal patellar mobility and no MCL laxity. Tenderness found. Medial joint line and MCL tenderness noted. No lateral joint line, no LCL and no patellar tendon tenderness noted.       Legs: Tenderness medial joint line and inferior/medial edge of patella.  Questionable McMurray test.  Neurological: He is alert and oriented to person, place, and time.  Skin: Skin is warm and dry.  Nursing note and vitals reviewed.   ED Course  Procedures  none    Imaging Review Dg Knee Complete 4 Views Left  02/09/2014   CLINICAL DATA:  Anterior knee pain after fall 6 weeks ago  EXAM: LEFT KNEE - COMPLETE 4+ VIEW  COMPARISON:  None.  FINDINGS: The knee is located. Negative for fracture. Joint spaces are maintained. No significant degenerative bony changes. No definite joint effusion. Soft tissues are unremarkable.  IMPRESSION: No acute bony abnormality identified.   Electronically Signed   By: Curlene Dolphin M.D.   On: 02/09/2014 13:10     MDM   1. Sprain of left knee, initial encounter; suspect meniscus injury   2. Knee injury, left, initial encounter   3. Knee injury, left, initial encounter    Rx for Flector patch.  Tramadol at bedtime. Hinged knee brace applied.   May continue normal duties. Followup with [facility]:  Recommend referral to  Sports Medicine for further evaluation.    Kandra Nicolas, MD 02/11/14 [phone]

## 2014-02-25 ENCOUNTER — Ambulatory Visit: Payer: Managed Care, Other (non HMO) | Admitting: Neurology

## 2014-03-04 ENCOUNTER — Encounter: Payer: Self-pay | Admitting: Endocrinology

## 2014-03-04 ENCOUNTER — Ambulatory Visit (INDEPENDENT_AMBULATORY_CARE_PROVIDER_SITE_OTHER): Payer: Managed Care, Other (non HMO) | Admitting: Endocrinology

## 2014-03-04 ENCOUNTER — Telehealth: Payer: Self-pay | Admitting: Endocrinology

## 2014-03-04 VITALS — BP 126/84 | HR 83 | Temp 98.2°F | Ht 68.5 in | Wt 230.0 lb

## 2014-03-04 DIAGNOSIS — R200 Anesthesia of skin: Secondary | ICD-10-CM

## 2014-03-04 DIAGNOSIS — Z125 Encounter for screening for malignant neoplasm of prostate: Secondary | ICD-10-CM

## 2014-03-04 DIAGNOSIS — Z119 Encounter for screening for infectious and parasitic diseases, unspecified: Secondary | ICD-10-CM

## 2014-03-04 DIAGNOSIS — Z23 Encounter for immunization: Secondary | ICD-10-CM

## 2014-03-04 LAB — PSA: PSA: 0.42 ng/mL (ref 0.10–4.00)

## 2014-03-04 NOTE — Telephone Encounter (Signed)
Patient would like to have Dr. Loanne Drilling refer him to [facility]  He does have an appointment with [facility], but would rather go to [facility]   Please refer patient   Also patient appointment at [facility] is 1.25.16   Thank you

## 2014-03-04 NOTE — Progress Notes (Signed)
Subjective:    Patient ID: Ruben West, male    DOB: [birth year], 58 y.o.   MRN: [id number]  HPI Pt is here for regular wellness examination, and is feeling pretty well in general, and says chronic med probs are stable, except as noted below Past Medical History  Diagnosis Date  . Hypercholesteremia   . ALLERGIC RHINITIS 11/23/2006  . HYPERTENSION 11/23/2006  . HYPERGLYCEMIA 04/04/2007  . FATTY LIVER DISEASE 05/09/2008  . 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 Migraines   . NASH (nonalcoholic steatohepatitis)   . PUD (peptic ulcer disease)   . GERD 04/04/2007    no per pt  . Heart murmur   . Back pain     Past Surgical History  Procedure Laterality Date  . Vasectomy    . Electrocardiogram  02/21/2006  . Colonoscopy      History   Social History  . Marital Status: Married    Spouse Name: N/A    Number of Children: N/A  . 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 Smoking status: Never Smoker   . Smokeless tobacco: Never Used  . Alcohol Use: Yes     Comment: very occasionally  . Drug Use: No  . Sexual Activity: Not on file   Other Topics Concern  . Not on file   Social History Narrative     No Known Allergies  Family History  Problem Relation Age of Onset  . Heart attack Mother     MI  . Cancer Neg Hx     no cancer in immediate family  . Colon cancer Neg Hx   . Esophageal cancer Neg Hx   . Rectal cancer Neg Hx   . Stomach cancer Neg Hx     BP 126/84 mmHg  Pulse 83  Temp(Src) 98.2 F (36.8 C) (Oral)  Ht 5' 8.5" (1.74 m)  Wt 230 lb (104.327 kg)  BMI 34.46 kg/m2  SpO2 96%   Review of Systems  Constitutional: Negative for fever and unexpected weight change.  HENT: Negative for hearing loss.   Eyes: Negative for visual disturbance.  Respiratory: Negative for shortness of breath.   Cardiovascular: Negative for chest pain.  Gastrointestinal: Negative  for anal bleeding.  Endocrine: Negative for cold intolerance.  Genitourinary: Negative for hematuria and difficulty urinating.  Musculoskeletal: Negative for gait problem.  Skin: Negative for rash.  Allergic/Immunologic: Negative for environmental allergies.  Neurological: Negative for syncope.  Hematological: Does not bruise/bleed easily.  Psychiatric/Behavioral: Negative for dysphoric mood.       Objective:   Physical Exam VS: see vs page GEN: no distress HEAD: head: no deformity eyes: no periorbital swelling, no proptosis external nose and ears are normal mouth: no lesion seen NECK: supple, thyroid is not enlarged CHEST WALL: no deformity LUNGS: clear to auscultation BREASTS:  No gynecomastia CV: reg rate and rhythm, no murmur ABD: abdomen is soft, nontender.  no hepatosplenomegaly.  not distended.  no hernia RECTAL/PROSTATE: declined.   MUSCULOSKELETAL: muscle bulk and strength are grossly normal.  no obvious joint swelling.  gait is normal and steady EXTEMITIES: no deformity.  no ulcer on the feet.  feet are of normal color and temp.  no edema PULSES: dorsalis pedis intact bilat.  no carotid bruit NEURO:  cn 2-12 grossly intact (facial numbness is not present now).   readily moves all 4's.  sensation is intact  to touch on the feet SKIN:  Normal texture and temperature.  No rash or suspicious lesion is visible.   NODES:  None palpable at the neck PSYCH: alert, well-oriented.  Does not appear anxious nor depressed.       Assessment & Plan:  Wellness visit today, with problems stable, except as noted.  Patient is advised the following: Patient Instructions  blood tests are being requested for you today.  We'll let you know about the results. please consider these measures for your health:  minimize alcohol.  do not use tobacco products.  have a colonoscopy at least every 10 years from age 55.  keep firearms safely stored.  always use seat belts.  have working smoke  alarms in your home.  see an eye doctor and dentist regularly.  never drive under the influence of alcohol or drugs (including prescription drugs).  those with fair skin should take precautions against the sun. Please return in 1 year.

## 2014-03-04 NOTE — Telephone Encounter (Signed)
i requested appt

## 2014-03-04 NOTE — Patient Instructions (Signed)
blood tests are being requested for you today.  We'll let you know about the results. please consider these measures for your health:  minimize alcohol.  do not use tobacco products.  have a colonoscopy at least every 10 years from age 58.  keep firearms safely stored.  always use seat belts.  have working smoke alarms in your home.  see an eye doctor and dentist regularly.  never drive under the influence of alcohol or drugs (including prescription drugs).  those with fair skin should take precautions against the sun. Please return in 1 year.

## 2014-03-05 LAB — HEPATITIS C ANTIBODY: HCV AB: NEGATIVE

## 2014-04-02 ENCOUNTER — Ambulatory Visit (INDEPENDENT_AMBULATORY_CARE_PROVIDER_SITE_OTHER): Payer: Managed Care, Other (non HMO) | Admitting: Neurology

## 2014-04-02 ENCOUNTER — Encounter: Payer: Self-pay | Admitting: Neurology

## 2014-04-02 VITALS — BP 140/98 | HR 69 | Ht 68.5 in | Wt 238.5 lb

## 2014-04-02 DIAGNOSIS — R569 Unspecified convulsions: Secondary | ICD-10-CM

## 2014-04-02 DIAGNOSIS — R6889 Other general symptoms and signs: Secondary | ICD-10-CM

## 2014-04-02 DIAGNOSIS — R209 Unspecified disturbances of skin sensation: Secondary | ICD-10-CM

## 2014-04-02 DIAGNOSIS — R202 Paresthesia of skin: Secondary | ICD-10-CM

## 2014-04-02 DIAGNOSIS — IMO0001 Reserved for inherently not codable concepts without codable children: Secondary | ICD-10-CM

## 2014-04-02 DIAGNOSIS — G43109 Migraine with aura, not intractable, without status migrainosus: Secondary | ICD-10-CM

## 2014-04-02 MED ORDER — TOPIRAMATE 25 MG PO TABS: ORAL_TABLET | ORAL | Status: DC

## 2014-04-02 NOTE — Progress Notes (Signed)
[facility] Clinic Note - Initial Visit   Date: 04/02/2014  [name] MRN: [id number] DOB: 21-Feb-1956   Dear Dr. Loanne Drilling:  Thank you for your kind referral of Ruben West for consultation of right face numbness. Although his history is well known to you, please allow [name] to reiterate it for the purpose of our medical record. The patient was accompanied to the clinic by self.   History of Present Illness: Ruben West is a 59 y.o. right-handed Caucasian male with hyperlipidemia, hypertension, complex migraines, and nonalcoholic steatohepatitis presenting for evaluation of right facial numbness/tingling.  Starting around 2006, he develops spells of right lower cheek numbness and feels as if his lip "drops" (but he is unsure of true facial droop).  There is no drooling or slurring of speech.  These spells can last from 15 seconds - 60 seconds and occur about once per week.   No identifiable triggers, exacerbating, or alleviating factors.  He works as a Pharmacist, community and endorses getting adequate rest.  Denies any changes in vision, taste, weakness, or numbness/tingling involving the limbs.  Patient was admitted to [facility] on 10/17-10/18/2015 with similar spell except this occurred 4-days in a row and lasted 2-minutes of right facial numbness and underwent stroke work-up which was unrevealing.  Labs notable for LDL 70 and HbA1c 5.0. CT scan and MRI were unrevealing. Echocardiogram noted no evidence of clot or systolic/diastolic failure. Vessel imaging showed no evidence of intra/extracranial stenosis. In further discussion with the patient, he had a few days ago of associated perioral numbness. In addition with his headaches preceding or following the symptoms, it was very strongly suspected that he had migraine aura and not TIA. For migraine prevention, patient not felt to be a good candidate for propranolol given resting heart rate in the low 60s.  He  was recommended to start topamax, but he did not take it due to reading side effects and concern of sedation.  He reports having a history of migraines since 1983 which has improved over the years.  Currently, he has one migraine every 4-6 months which responds to Imitrex.  Pain is at the base of the neck and radiates into his head.  He has associated nausea, vomiting, photophobia, and phonophobia.  Headaches last all day.     Out-side paper records, electronic medical record, and images have been reviewed where available and summarized as:  MRI brain wo contrast 01/13/2013:  Normal MRA head 01/13/2013:  Normal  US carotids 01/13/2014: 1-39% stenosis bilaterally   Lab Results  Component Value Date   TSH 3.27 06/30/2012   Lab Results  Component Value Date   HGBA1C 5.0 01/13/2014     Past Medical History  Diagnosis Date  . Hypercholesteremia   . ALLERGIC RHINITIS 11/23/2006  . HYPERTENSION 11/23/2006  . HYPERGLYCEMIA 04/04/2007  . FATTY LIVER DISEASE 05/09/2008  . 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 Migraines   . NASH (nonalcoholic steatohepatitis)   . PUD (peptic ulcer disease)   . GERD 04/04/2007    no per pt  . Heart murmur   . Back pain     Past Surgical History  Procedure Laterality Date  . Vasectomy    . Electrocardiogram  02/21/2006  . Colonoscopy       Medications:  Current Outpatient Prescriptions on File Prior to Visit  Medication Sig Dispense Refill  . aspirin EC 81 MG tablet Take 81 mg by mouth  every morning.    . diclofenac (FLECTOR) 1.3 % PTCH Place 1 patch onto the skin 2 (two) times daily. 30 patch 0  . losartan-hydrochlorothiazide (HYZAAR) 50-12.5 MG per tablet Take 1 tablet daily. APPOINTMENT NEEDED FOR FURTHER REFILLS. 90 tablet 0  . lovastatin (MEVACOR) 40 MG tablet Take 40 mg by mouth at bedtime.    . SUMAtriptan (IMITREX) 100 MG tablet Take 100 mg by mouth every 2 (two) hours as needed for migraine or  headache. May repeat in 2 hours if headache persists or recurs. NO MORE THAN 2 TABLETS IN 24 HOURS    . traMADol (ULTRAM) 50 MG tablet Take 1 tablet (50 mg total) by mouth at bedtime as needed. 15 tablet 0  . topiramate (TOPAMAX) 50 MG tablet 1 tab po daily x 1 week, then 1 po bid (Patient not taking: Reported on 04/02/2014) 60 tablet 1   No current facility-administered medications on file prior to visit.    Allergies: No Known Allergies  Family History: Family History  Problem Relation Age of Onset  . Heart attack Mother     MI, deceased 35  . Cancer Neg Hx     no cancer in immediate family  . Colon cancer Neg Hx   . Esophageal cancer Neg Hx   . Rectal cancer Neg Hx   . Stomach cancer Neg Hx   . Emphysema Father     Deceased, 79  . Healthy Daughter     x2    Social History: History   Social History  . Marital Status: Married    Spouse Name: N/A    Number of Children: N/A  . 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 Smoking status: Never Smoker   . Smokeless tobacco: Never Used  . Alcohol Use: 0.0 oz/week    0 Not specified per week     Comment: Rarely  . Drug Use: No  . Sexual Activity: Not on file   Other Topics Concern  . Not on file   Social History Narrative   Lives with wife in a one story home.  Has 2 grandchildren living with him.     Works as a Administrator.     Education: 10th grade.         Review of Systems:  CONSTITUTIONAL: No fevers, chills, night sweats, or weight loss.   EYES: No visual changes or eye pain ENT: No hearing changes.  No history of nose bleeds.   RESPIRATORY: No cough, wheezing and shortness of breath.   CARDIOVASCULAR: Negative for chest pain, and palpitations.   GI: Negative for abdominal discomfort, blood in stools or black stools.  No recent change in bowel habits.   GU:  No history of incontinence.   MUSCLOSKELETAL: No history of joint pain or swelling.  No myalgias.     SKIN: Negative for lesions, rash, and itching.   HEMATOLOGY/ONCOLOGY: Negative for prolonged bleeding, bruising easily, and swollen nodes.   ENDOCRINE: Negative for cold or heat intolerance, polydipsia or goiter.   PSYCH:  No depression or anxiety symptoms.   NEURO: As Above.   Vital Signs:  BP 140/98 mmHg  Pulse 69  Ht 5' 8.5" (1.74 m)  Wt 238 lb 8 oz (108.183 kg)  BMI 35.73 kg/m2  SpO2 99%   General Medical Exam:   General:  Well appearing, comfortable.   Eyes/ENT: see cranial nerve examination.   Neck: No masses  appreciated.  Full range of motion without tenderness.  No carotid bruits. Respiratory:  Clear to auscultation, good air entry bilaterally.   Cardiac:  Regular rate and rhythm, no murmur.   Extremities:  No deformities, edema, or skin discoloration.  Skin:  No rashes or lesions.  Neurological Exam: MENTAL STATUS including orientation to time, place, person, recent and remote memory, attention span and concentration, language, and fund of knowledge is normal.  Speech is not dysarthric.  CRANIAL NERVES: II:  No visual field defects.  Unremarkable fundi.   III-IV-VI: Pupils equal round and reactive to light.  Normal conjugate, extra-ocular eye movements in all directions of gaze.  No nystagmus.  No ptosis.   V:  Reduced pin pick over right V3 distribution only.   VII:  Normal facial symmetry and movements.  VIII:  Normal hearing and vestibular function.   IX-X:  Normal palatal movement.   XI:  Normal shoulder shrug and head rotation.   XII:  Normal tongue strength and range of motion, no deviation or fasciculation.  MOTOR:  No atrophy, fasciculations or abnormal movements.  No pronator drift.  Tone is normal.    Right Upper Extremity:    Left Upper Extremity:    Deltoid  5/5   Deltoid  5/5   Biceps  5/5   Biceps  5/5   Triceps  5/5   Triceps  5/5   Wrist extensors  5/5   Wrist extensors  5/5   Wrist flexors  5/5   Wrist flexors  5/5   Finger extensors  5/5    Finger extensors  5/5   Finger flexors  5/5   Finger flexors  5/5   Dorsal interossei  5/5   Dorsal interossei  5/5   Abductor pollicis  5/5   Abductor pollicis  5/5   Tone (Ashworth scale)  0  Tone (Ashworth scale)  0   Right Lower Extremity:    Left Lower Extremity:    Hip flexors  5/5   Hip flexors  5/5   Hip extensors  5/5   Hip extensors  5/5   Knee flexors  5/5   Knee flexors  5/5   Knee extensors  5/5   Knee extensors  5/5   Dorsiflexors  5/5   Dorsiflexors  5/5   Plantarflexors  5/5   Plantarflexors  5/5   Toe extensors  5/5   Toe extensors  5/5   Toe flexors  5/5   Toe flexors  5/5   Tone (Ashworth scale)  0  Tone (Ashworth scale)  0   MSRs:  Right                                                                 Left brachioradialis 2+  brachioradialis 2+  biceps 2+  biceps 2+  triceps 2+  triceps 2+  patellar 2+  patellar 2+  ankle jerk 2+  ankle jerk 2+  Hoffman no  Hoffman no  plantar response down  plantar response down   SENSORY: Sensation to pin prick reduced in right hand and right lower extremity. Vibration is absent distal to right ankle.  Sensation in upper extremities and left lower extremity is intact.  Romberg's sign absent.   COORDINATION/GAIT: Normal finger-to- nose-finger.  Intact rapid alternating movements bilaterally.  Able to rise from a chair without using arms.  Gait narrow based and stable. Tandem and stressed gait intact.    IMPRESSION: Mr. Gandolfo is a 59 year-old gentleman presenting for evaluation of episodic right facial paresthesias.  Exam is notable to reduced sensation over the right V3 dermatome as well as patchy sensory loss involving the right arm and leg.  Otherwise, his strength, reflexes, and cranial nerve testing is normal.  He has underwent extensive work-up including MRI/A brain, US carotids, and echocardiogram which returned normal.  MRI of the face to look at the trigeminal nerve will be performed.  Additionally, given the stereotyped  nature, I will check EEG although my suspicion that these represent seizures is low.     PLAN/RECOMMENDATIONS:  1.  Routine EEG 2.  MRI face to look at right trigeminal nerve - V3 branch 3.  Consider EMG of the right side going forward 4.  Encouraged to keep a journal of these event 5.  Start topamax 25mg  daily x 1 week, then increase to 1 tablet twice daily 6.  Return to clinic in 2-3 months.   The duration of this appointment visit was 45 minutes of face-to-face time with the patient.  Greater than 50% of this time was spent in counseling, explanation of diagnosis, planning of further management, and coordination of care.   Thank you for allowing me to participate in patient's care.  If I can answer any additional questions, I would be pleased to do so.    Sincerely,    Donika K. Posey Pronto, DO

## 2014-04-02 NOTE — Patient Instructions (Addendum)
1.  Routine EEG 2.  MRI face to look at right trigeminal nerve - V3 branch 3.  Consider EMG of the right side going forward 4.  Encouraged to keep a journal of these event 5.  Start topamax 25mg  at bedtime for one week, then increase to 25mg  (1 tablet) morning and evening 6.  Return to clinic in 6-8 weeks

## 2014-04-03 ENCOUNTER — Telehealth: Payer: Self-pay | Admitting: *Deleted

## 2014-04-03 NOTE — Telephone Encounter (Signed)
Patient's wife notified that the MRI is set up for Jan.15 at 2:15.  She was given the phone number to call and reschedule if necessary.

## 2014-04-04 ENCOUNTER — Ambulatory Visit (INDEPENDENT_AMBULATORY_CARE_PROVIDER_SITE_OTHER): Payer: Managed Care, Other (non HMO) | Admitting: Neurology

## 2014-04-04 DIAGNOSIS — R202 Paresthesia of skin: Secondary | ICD-10-CM

## 2014-04-04 DIAGNOSIS — R209 Unspecified disturbances of skin sensation: Secondary | ICD-10-CM

## 2014-04-04 DIAGNOSIS — IMO0001 Reserved for inherently not codable concepts without codable children: Secondary | ICD-10-CM

## 2014-04-04 DIAGNOSIS — R6889 Other general symptoms and signs: Secondary | ICD-10-CM

## 2014-04-04 DIAGNOSIS — G43109 Migraine with aura, not intractable, without status migrainosus: Secondary | ICD-10-CM

## 2014-04-09 NOTE — Procedures (Signed)
ELECTROENCEPHALOGRAM REPORT  Date of Study: 04/04/2014  Patient's Name: Ruben West MRN: [id number] Date of Birth: 10/08/1955  Referring Provider: Dr. Narda Amber  Clinical History: This is a 59 year old man with episodic right facial paresthesias  Medications: Topamax, Tramadol, Imitrex, Hyzaar, aspirin, lovastatin  Technical Summary: A multichannel digital EEG recording measured by the international 10-20 system with electrodes applied with paste and impedances below 5000 ohms performed in our laboratory with EKG monitoring in an awake and asleep patient.  Hyperventilation and photic stimulation were performed.  The digital EEG was referentially recorded, reformatted, and digitally filtered in a variety of bipolar and referential montages for optimal display.    Description: The patient is awake and asleep during the recording.  During maximal wakefulness, there is a symmetric, medium voltage 10 Hz posterior dominant rhythm that attenuates with eye opening.  The record is symmetric.  During drowsiness and sleep, there is an increase in theta slowing of the background.  Vertex waves and symmetric sleep spindles were seen.  Hyperventilation and photic stimulation did not elicit any abnormalities. At 01:54 minutes into the recording, he reports left facial numbness that lasted for 20 seconds. Electrographically, there were no EEG changes seen. There was muscle artifact in the left temporal derivations, however in between artifact, there were no epileptiform discharges noted.  There were no epileptiform discharges or electrographic seizures seen.    EKG lead was unremarkable.  Impression: This awake and asleep EEG is normal.  Episode of left facial numbness did not show any electrographic correlate.  Clinical Correlation: A normal EEG does not exclude a clinical diagnosis of epilepsy. Episode of left facial numbness did not show electrographic correlate. Simple partial seizures may have  negative scalp EEG correlate. Clinical correlation is advised.   Ellouise Newer, M.D.

## 2014-04-12 ENCOUNTER — Other Ambulatory Visit: Payer: Self-pay

## 2014-04-15 ENCOUNTER — Other Ambulatory Visit: Payer: Self-pay

## 2014-04-22 ENCOUNTER — Ambulatory Visit: Payer: Managed Care, Other (non HMO) | Admitting: Neurology

## 2014-05-09 ENCOUNTER — Other Ambulatory Visit: Payer: Self-pay | Admitting: Endocrinology

## 2014-05-10 NOTE — Telephone Encounter (Signed)
Please advise if ok to refill rx. Rx is listed under a historical provider.  Thanks!

## 2014-05-28 ENCOUNTER — Telehealth: Payer: Self-pay | Admitting: Neurology

## 2014-05-28 NOTE — Telephone Encounter (Signed)
Pt canceled appt to see Dr Posey Pronto on 06-03-14 and will call back to resch

## 2014-06-03 ENCOUNTER — Ambulatory Visit: Payer: Self-pay | Admitting: Neurology

## 2014-07-29 ENCOUNTER — Other Ambulatory Visit: Payer: Self-pay | Admitting: Endocrinology

## 2014-08-05 ENCOUNTER — Telehealth: Payer: Self-pay | Admitting: Endocrinology

## 2014-08-05 MED ORDER — LOSARTAN POTASSIUM-HCTZ 50-12.5 MG PO TABS: ORAL_TABLET | ORAL | Status: DC

## 2014-08-05 NOTE — Telephone Encounter (Signed)
Patient wife would like to know why her husband has to make and appointment before he can get his meds filled, please advise

## 2014-08-05 NOTE — Telephone Encounter (Signed)
Pt's wife advised rx for Losartan-hydrochlorothiazide has been sent.

## 2014-11-10 ENCOUNTER — Other Ambulatory Visit: Payer: Self-pay | Admitting: Endocrinology

## 2014-11-11 ENCOUNTER — Telehealth: Payer: Self-pay | Admitting: Endocrinology

## 2014-11-11 MED ORDER — LOVASTATIN 40 MG PO TABS: 40.0000 mg | ORAL_TABLET | Freq: Every day | ORAL | Status: DC

## 2014-11-11 NOTE — Telephone Encounter (Signed)
Rx sent per pt's request.  

## 2014-11-11 NOTE — Telephone Encounter (Signed)
lovostatin 40 mg needs to be called into [facility] # [phone]

## 2014-11-11 NOTE — Telephone Encounter (Signed)
Please advise if ok to refill. Last office visit was December of 2015. Thanks!

## 2014-11-27 ENCOUNTER — Telehealth: Payer: Self-pay

## 2014-11-27 NOTE — Telephone Encounter (Signed)
Pt will get B/P recheck on f/u visit 02/2015

## 2015-02-22 ENCOUNTER — Ambulatory Visit (INDEPENDENT_AMBULATORY_CARE_PROVIDER_SITE_OTHER): Payer: Managed Care, Other (non HMO) | Admitting: Internal Medicine

## 2015-02-22 VITALS — BP 118/80 | HR 92 | Temp 98.0°F | Resp 16 | Ht 68.5 in | Wt 241.0 lb

## 2015-02-22 DIAGNOSIS — H8113 Benign paroxysmal vertigo, bilateral: Secondary | ICD-10-CM

## 2015-02-22 LAB — POCT CBC
GRANULOCYTE PERCENT: 69.3 % (ref 37–80)
HEMATOCRIT: 48.1 % (ref 43.5–53.7)
Hemoglobin: 16.9 g/dL (ref 14.1–18.1)
LYMPH, POC: 2.1 (ref 0.6–3.4)
MCH, POC: 30.6 pg (ref 27–31.2)
MCHC: 35.1 g/dL (ref 31.8–35.4)
MCV: 87.2 fL (ref 80–97)
MID (cbc): 0.4 (ref 0–0.9)
MPV: 7.5 fL (ref 0–99.8)
POC GRANULOCYTE: 5.7 (ref 2–6.9)
POC LYMPH PERCENT: 26.2 %L (ref 10–50)
POC MID %: 4.5 %M (ref 0–12)
Platelet Count, POC: 226 10*3/uL (ref 142–424)
RBC: 5.52 M/uL (ref 4.69–6.13)
RDW, POC: 13.1 %
WBC: 8.2 10*3/uL (ref 4.6–10.2)

## 2015-02-22 LAB — POCT GLYCOSYLATED HEMOGLOBIN (HGB A1C): HEMOGLOBIN A1C: 5

## 2015-02-22 LAB — HEMOGLOBIN A1C: Hgb A1c MFr Bld: 5 % (ref 4.0–6.0)

## 2015-02-22 MED ORDER — AMOXICILLIN 875 MG PO TABS
875.0000 mg | ORAL_TABLET | Freq: Two times a day (BID) | ORAL | Status: DC
Start: 2015-02-22 — End: 2015-03-16

## 2015-02-22 MED ORDER — ONDANSETRON HCL 4 MG PO TABS: 4.0000 mg | ORAL_TABLET | Freq: Three times a day (TID) | ORAL | Status: DC | PRN

## 2015-02-22 MED ORDER — PREDNISONE 20 MG PO TABS: ORAL_TABLET | ORAL | Status: DC

## 2015-02-22 NOTE — Progress Notes (Signed)
Subjective:  This chart was scribed for Ruben Lin, MD by Thea Alken, ED Scribe. This patient was seen in room 13 and the patient's care was started at 3:37 PM.   Patient ID: Ruben West, male    DOB: Dec 07, 1955, 59 y.o.   MRN: [id number]  HPI   Chief Complaint  Patient presents with  . Dizziness    x 2 weeks  . Nausea    today  . Headache    feels like its in the front of his head   HPI Comments: Ruben West is a 59 y.o. male who presents to the [facility] complaining of dizziness for 2 weeks. Pt reports dizziness with changing positions including standing from sitting and sitting up from laying down. He describes dizziness as if his head is spinning with difficulty focusing. He has tried meclozine without relief to dizziness. He's had associated nausea, tinnitus, ear drainage, cough, nasal congestion and allergy symptoms. Dizziness does not affect his driving.  Last migraine was 3 weeks ago.   Patient Active Problem List   Diagnosis Date Noted  . Obesity (BMI 30-39.9) 01/13/2014    Priority: Medium  . Migraine with aura 01/12/2014    Priority: Medium  . Hyperlipidemia 01/12/2014    Priority: Medium  . Essential hypertension 11/23/2006    Priority: Medium  . Screening examination for infectious disease 03/04/2014  . Complicated migraine [id number]  . Numbness 01/12/2014  . Atypical chest pain 01/12/2014  . Migraine, unspecified, without mention of intractable migraine without mention of status migrainosus 06/30/2012  . Depressive disorder, not elsewhere classified 05/22/2012  . Chronic meniscal tear of knee 12/22/2011  . FATTY LIVER DISEASE 05/09/2008  . OTHER TESTICULAR HYPOFUNCTION 05/19/2007  . GERD 04/04/2007  . 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 HYPERGLYCEMIA 04/04/2007  . ALLERGIC RHINITIS 11/23/2006    Past Medical History  Diagnosis Date  . Hypercholesteremia   . ALLERGIC RHINITIS 11/23/2006  . HYPERTENSION  11/23/2006  . HYPERGLYCEMIA 04/04/2007  . FATTY LIVER DISEASE 05/09/2008  . 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 Migraines   . NASH (nonalcoholic steatohepatitis)   . PUD (peptic ulcer disease)   . GERD 04/04/2007    no per pt  . Heart murmur   . Back pain    No Known Allergies Prior to Admission medications   Medication Sig Start Date End Date Taking? Authorizing Provider  aspirin EC 81 MG tablet Take 81 mg by mouth every morning.   Yes Historical Provider, MD  losartan-hydrochlorothiazide (HYZAAR) 50-12.5 MG per tablet Take 1 tablet daily. 08/05/14  Yes Renato Shin, MD  lovastatin (MEVACOR) 40 MG tablet Take 1 tablet (40 mg total) by mouth at bedtime. 11/11/14  Yes Renato Shin, MD  Sildenafil Citrate (REVATIO PO) Take by mouth.   Yes Historical Provider, MD  SUMAtriptan (IMITREX) 100 MG tablet TAKE 1 TABLET BY MOUTH EVERY 2 HOURS AS NEEDED FOR MIGRAINE *DO NOT EXCEED 2 PER DAY 11/11/14  Yes Renato Shin, MD  topiramate (TOPAMAX) 25 MG tablet Take 1 tablet at bedtime x 1 week, then increase to 1 tablet morning and evening. 04/02/14  Yes Alda Berthold, DO    Review of Systems  Constitutional: Negative for fever and chills.  HENT: Positive for congestion and tinnitus.   Respiratory: Positive for cough.   Gastrointestinal: Positive for nausea. Negative for vomiting.  Neurological: Positive for dizziness.   Objective:   Physical Exam  Constitutional:  He is oriented to person, place, and time. He appears well-developed and well-nourished. No distress.  HENT:  Head: Normocephalic and atraumatic.  Nose: Nose normal.  Mouth/Throat: Oropharynx is clear and moist.  Left TM is slightly red and slightly bulging  Eyes: Conjunctivae and EOM are normal. Pupils are equal, round, and reactive to light.  Neck: Neck supple. No thyromegaly present.  Cardiovascular: Normal rate, regular rhythm, normal heart sounds and intact distal pulses.   No murmur  heard. Pulmonary/Chest: Effort normal and breath sounds normal.  Musculoskeletal: Normal range of motion.  Lymphadenopathy:    He has no cervical adenopathy.  Neurological: He is alert and oriented to person, place, and time. He has normal reflexes. No cranial nerve deficit.  No cranial nerve defect. There is mild nystagmus with rapid horizontal eye movement. Symptoms are recreated. Vertigo recreated with sitting to standing. His rhomberg is negative.. 1 leg stance is intact.   Skin: Skin is warm and dry.  Psychiatric: He has a normal mood and affect. His behavior is normal.  Nursing note and vitals reviewed.   Filed Vitals:   02/22/15 1533  BP: 118/80  Pulse: 92  Temp: 98 F (36.7 C)  TempSrc: Oral  Resp: 16  Height: 5' 8.5" (1.74 m)  Weight: 241 lb (109.317 kg)  SpO2: 97%    Assessment & Plan:  Benign paroxysmal positional vertigo, bilateral -with most pronounced symptoms rolling head to R and back Prolonged allergy sxt with probable sinusitis ? Part of this presentation  Plan Laruth Bouchard darhoff manuvers Meds ordered this encounter  Medications  . predniSONE (DELTASONE) 20 MG tablet    Sig: 3/3/2/2/1/1 single daily dose for 6 days    Dispense:  12 tablet    Refill:  0  . amoxicillin (AMOXIL) 875 MG tablet    Sig: Take 1 tablet (875 mg total) by mouth 2 (two) times daily.    Dispense:  20 tablet    Refill:  0  . ondansetron (ZOFRAN) 4 MG tablet    Sig: Take 1 tablet (4 mg total) by mouth every 8 (eight) hours as needed for nausea or vomiting.    Dispense:  20 tablet    Refill:  0   If not resolved 1-2 weeks will ref to ENT  By signing my name below, I, Raven Small, attest that this documentation has been prepared under the direction and in the presence of Ruben Lin, MD.  Electronically Signed: Thea Alken, ED Scribe. 02/22/2015. 3:49 PM.  I have completed the patient encounter in its entirety as documented by the scribe, with editing by me where  necessary. Rod Majerus P. Laney Pastor, M.D.

## 2015-02-23 LAB — COMPREHENSIVE METABOLIC PANEL
ALT: 48 U/L — ABNORMAL HIGH (ref 9–46)
AST: 25 U/L (ref 10–35)
Albumin: 4.2 g/dL (ref 3.6–5.1)
Alkaline Phosphatase: 63 U/L (ref 40–115)
BUN: 15 mg/dL (ref 7–25)
CALCIUM: 9.2 mg/dL (ref 8.6–10.3)
CO2: 29 mmol/L (ref 20–31)
Chloride: 105 mmol/L (ref 98–110)
Creat: 0.93 mg/dL (ref 0.70–1.33)
GLUCOSE: 149 mg/dL — AB (ref 65–99)
POTASSIUM: 4 mmol/L (ref 3.5–5.3)
Sodium: 141 mmol/L (ref 135–146)
Total Bilirubin: 0.5 mg/dL (ref 0.2–1.2)
Total Protein: 6.8 g/dL (ref 6.1–8.1)

## 2015-02-26 ENCOUNTER — Encounter: Payer: Self-pay | Admitting: Family Medicine

## 2015-02-26 ENCOUNTER — Encounter: Payer: Self-pay | Admitting: Internal Medicine

## 2015-03-16 ENCOUNTER — Ambulatory Visit (INDEPENDENT_AMBULATORY_CARE_PROVIDER_SITE_OTHER): Payer: Managed Care, Other (non HMO) | Admitting: Physician Assistant

## 2015-03-16 VITALS — BP 124/74 | HR 88 | Temp 98.5°F | Resp 17 | Ht 69.5 in | Wt 235.0 lb

## 2015-03-16 DIAGNOSIS — J069 Acute upper respiratory infection, unspecified: Secondary | ICD-10-CM | POA: Diagnosis not present

## 2015-03-16 DIAGNOSIS — B9789 Other viral agents as the cause of diseases classified elsewhere: Principal | ICD-10-CM

## 2015-03-16 MED ORDER — HYDROCOD POLST-CPM POLST ER 10-8 MG/5ML PO SUER: 5.0000 mL | Freq: Two times a day (BID) | ORAL | Status: DC | PRN

## 2015-03-16 MED ORDER — AZITHROMYCIN 250 MG PO TABS: ORAL_TABLET | ORAL | Status: AC

## 2015-03-16 MED ORDER — GUAIFENESIN ER 1200 MG PO TB12: 1.0000 | ORAL_TABLET | Freq: Two times a day (BID) | ORAL | Status: AC

## 2015-03-16 NOTE — Progress Notes (Signed)
Ruben West  MRN: [id number] DOB: 10-Nov-1955  Subjective:  Pt presents to clinic with cold symptoms for the last week.  Started with nasal congestion and now it seems like it has settled in his chest.  He thought he was doing ok until about 3 days ago when it seemed to changed and he started to feel worse.  He has had pneumonia in the past and he wants to make sure that is not what is going on now.   Patient Active Problem List   Diagnosis Date Noted  . Screening examination for infectious disease 03/04/2014  . Obesity (BMI 30-39.9) 01/13/2014  . Numbness 01/12/2014  . Migraine with aura 01/12/2014  . Atypical chest pain 01/12/2014  . Hyperlipidemia 01/12/2014  . Migraine, unspecified, without mention of intractable migraine without mention of status migrainosus 06/30/2012  . Depressive disorder, not elsewhere classified 05/22/2012  . Chronic meniscal tear of knee 12/22/2011  . FATTY LIVER DISEASE 05/09/2008  . OTHER TESTICULAR HYPOFUNCTION 05/19/2007  . GERD 04/04/2007  . 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 HYPERGLYCEMIA 04/04/2007  . Essential hypertension 11/23/2006  . ALLERGIC RHINITIS 11/23/2006    Current Outpatient Prescriptions on File Prior to Visit  Medication Sig Dispense Refill  . aspirin EC 81 MG tablet Take 81 mg by mouth every morning.    Marland Kitchen losartan-hydrochlorothiazide (HYZAAR) 50-12.5 MG per tablet Take 1 tablet daily. 90 tablet 2  . lovastatin (MEVACOR) 40 MG tablet Take 1 tablet (40 mg total) by mouth at bedtime. 90 tablet 1  . Sildenafil Citrate (REVATIO PO) Take by mouth. Reported on 03/16/2015     No current facility-administered medications on file prior to visit.    No Known Allergies  Review of Systems  Constitutional: Positive for chills. Negative for fever.  HENT: Positive for congestion, postnasal drip and rhinorrhea (green).   Respiratory: Positive for cough (green) and chest tightness (with coughing). Negative for shortness of breath  and wheezing.        No h/o asthma or lung disease, nonsmoker  Gastrointestinal: Negative.   Musculoskeletal: Positive for myalgias.  Psychiatric/Behavioral: Positive for sleep disturbance (2nd to cough).   Objective:  BP 124/74 mmHg  Pulse 88  Temp(Src) 98.5 F (36.9 C) (Oral)  Resp 17  Ht 5' 9.5" (1.765 m)  Wt 235 lb (106.595 kg)  BMI 34.22 kg/m2  SpO2 97%  Physical Exam  Constitutional: He is oriented to person, place, and time and well-developed, well-nourished, and in no distress.     HENT:  Head: Normocephalic and atraumatic.  Right Ear: Hearing, tympanic membrane, external ear and ear canal normal.  Left Ear: Hearing, tympanic membrane, external ear and ear canal normal.  Nose: Nose normal.  Mouth/Throat: Uvula is midline, oropharynx is clear and moist and mucous membranes are normal.  Eyes: Conjunctivae are normal.  Neck: Normal range of motion.  Cardiovascular: Normal rate, regular rhythm and normal heart sounds.   Pulmonary/Chest: Effort normal and breath sounds normal. He has no wheezes.  Lymphadenopathy:       Head (right side): No tonsillar adenopathy present.       Head (left side): No tonsillar adenopathy present.    He has no cervical adenopathy.       Right: No supraclavicular adenopathy present.       Left: No supraclavicular adenopathy present.  Neurological: He is alert and oriented to person, place, and time. Gait normal.  Skin: Skin is warm and dry.  Psychiatric: Mood, memory, affect and judgment  normal.    Assessment and Plan :  Viral URI with cough - Plan: Guaifenesin (MUCINEX MAXIMUM STRENGTH) 1200 MG TB12, azithromycin (ZITHROMAX Z-PAK) 250 MG tablet, chlorpheniramine-HYDROcodone (TUSSIONEX PENNKINETIC ER) 10-8 MG/5ML SUER, Care order/instruction  I suspect this is viral at this point - but due to the upcoming holidays I have sent in an abx to the pharmacy that he will fill if he is not improving in the next 4 days.  Windell Hummingbird PA-C  [facility] 03/16/2015 10:05 AM

## 2015-03-16 NOTE — Patient Instructions (Signed)
Please push fluids.  Tylenol and Motrin for fever and body aches.    A humidifier can help especially when the air is dry -if you do not have a humidifier you can boil a pot of water on the stove in your home to help with the dry air.

## 2015-05-19 ENCOUNTER — Other Ambulatory Visit: Payer: Self-pay | Admitting: Endocrinology

## 2015-06-10 ENCOUNTER — Encounter: Payer: Self-pay | Admitting: Endocrinology

## 2015-06-10 ENCOUNTER — Ambulatory Visit (INDEPENDENT_AMBULATORY_CARE_PROVIDER_SITE_OTHER): Payer: Managed Care, Other (non HMO) | Admitting: Endocrinology

## 2015-06-10 VITALS — BP 124/80 | HR 60 | Temp 97.9°F | Resp 20 | Ht 70.0 in | Wt 241.0 lb

## 2015-06-10 DIAGNOSIS — J309 Allergic rhinitis, unspecified: Secondary | ICD-10-CM | POA: Insufficient documentation

## 2015-06-10 DIAGNOSIS — E785 Hyperlipidemia, unspecified: Secondary | ICD-10-CM | POA: Diagnosis not present

## 2015-06-10 DIAGNOSIS — I10 Essential (primary) hypertension: Secondary | ICD-10-CM

## 2015-06-10 DIAGNOSIS — Z125 Encounter for screening for malignant neoplasm of prostate: Secondary | ICD-10-CM

## 2015-06-10 DIAGNOSIS — R7309 Other abnormal glucose: Secondary | ICD-10-CM

## 2015-06-10 DIAGNOSIS — Z0001 Encounter for general adult medical examination with abnormal findings: Secondary | ICD-10-CM | POA: Insufficient documentation

## 2015-06-10 DIAGNOSIS — Z0000 Encounter for general adult medical examination without abnormal findings: Secondary | ICD-10-CM

## 2015-06-10 DIAGNOSIS — R229 Localized swelling, mass and lump, unspecified: Secondary | ICD-10-CM | POA: Diagnosis not present

## 2015-06-10 LAB — URINALYSIS, ROUTINE W REFLEX MICROSCOPIC
BILIRUBIN URINE: NEGATIVE
HGB URINE DIPSTICK: NEGATIVE
KETONES UR: NEGATIVE
LEUKOCYTES UA: NEGATIVE
Nitrite: NEGATIVE
RBC / HPF: NONE SEEN (ref 0–?)
Specific Gravity, Urine: 1.02 (ref 1.000–1.030)
Total Protein, Urine: NEGATIVE
UROBILINOGEN UA: 0.2 (ref 0.0–1.0)
Urine Glucose: NEGATIVE
WBC UA: NONE SEEN (ref 0–?)
pH: 5.5 (ref 5.0–8.0)

## 2015-06-10 LAB — PSA: PSA: 0.4 ng/mL (ref 0.10–4.00)

## 2015-06-10 LAB — CBC WITH DIFFERENTIAL/PLATELET
BASOS ABS: 0 10*3/uL (ref 0.0–0.1)
BASOS PCT: 0.5 % (ref 0.0–3.0)
Eosinophils Absolute: 0.2 10*3/uL (ref 0.0–0.7)
Eosinophils Relative: 2.3 % (ref 0.0–5.0)
HEMATOCRIT: 47.1 % (ref 39.0–52.0)
HEMOGLOBIN: 16.1 g/dL (ref 13.0–17.0)
LYMPHS PCT: 32.1 % (ref 12.0–46.0)
Lymphs Abs: 2.1 10*3/uL (ref 0.7–4.0)
MCHC: 34.2 g/dL (ref 30.0–36.0)
MCV: 87.3 fl (ref 78.0–100.0)
MONO ABS: 0.6 10*3/uL (ref 0.1–1.0)
Monocytes Relative: 8.4 % (ref 3.0–12.0)
Neutro Abs: 3.8 10*3/uL (ref 1.4–7.7)
Neutrophils Relative %: 56.7 % (ref 43.0–77.0)
Platelets: 195 10*3/uL (ref 150.0–400.0)
RBC: 5.4 Mil/uL (ref 4.22–5.81)
RDW: 12.8 % (ref 11.5–15.5)
WBC: 6.7 10*3/uL (ref 4.0–10.5)

## 2015-06-10 LAB — BASIC METABOLIC PANEL
BUN: 14 mg/dL (ref 6–23)
CHLORIDE: 107 meq/L (ref 96–112)
CO2: 26 meq/L (ref 19–32)
CREATININE: 0.99 mg/dL (ref 0.40–1.50)
Calcium: 9.4 mg/dL (ref 8.4–10.5)
GFR: 82.02 mL/min (ref >60.00)
Glucose, Bld: 111 mg/dL — ABNORMAL HIGH (ref 70–99)
POTASSIUM: 4.1 meq/L (ref 3.5–5.1)
Sodium: 142 mEq/L (ref 135–145)

## 2015-06-10 LAB — LIPID PANEL
Cholesterol: 125 mg/dL (ref 0–200)
HDL: 45.4 mg/dL (ref >39.00)
LDL Cholesterol: 58 mg/dL (ref 0–99)
NONHDL: 79.11
TRIGLYCERIDES: 108 mg/dL (ref 0.0–149.0)
Total CHOL/HDL Ratio: 3
VLDL: 21.6 mg/dL (ref 0.0–40.0)

## 2015-06-10 LAB — HEPATIC FUNCTION PANEL
ALK PHOS: 57 U/L (ref 39–117)
ALT: 36 U/L (ref 0–53)
AST: 22 U/L (ref 0–37)
Albumin: 3.9 g/dL (ref 3.5–5.2)
BILIRUBIN DIRECT: 0.1 mg/dL (ref 0.0–0.3)
TOTAL PROTEIN: 6.4 g/dL (ref 6.0–8.3)
Total Bilirubin: 0.6 mg/dL (ref 0.2–1.2)

## 2015-06-10 LAB — TSH: TSH: 2.15 u[IU]/mL (ref 0.35–4.50)

## 2015-06-10 LAB — HEMOGLOBIN A1C: HEMOGLOBIN A1C: 5.2 % (ref 4.6–6.5)

## 2015-06-10 NOTE — Patient Instructions (Addendum)
blood tests are requested for you today.  We'll let you know about the results. please consider these measures for your health:  minimize alcohol.  do not use tobacco products.  have a colonoscopy at least every 10 years from age 60.  Women should have an annual mammogram from age 1.  keep firearms safely stored.  always use seat belts.  have working smoke alarms in your home.  see an eye doctor and dentist regularly.  never drive under the influence of alcohol or drugs (including prescription drugs).  those with fair skin should take precautions against the sun.  We'll let you know about the result of the skin nodule, also Please return in 1 year.

## 2015-06-10 NOTE — Progress Notes (Signed)
Pre visit review using our clinic review tool, if applicable. No additional management support is needed unless otherwise documented below in the visit note. 

## 2015-06-10 NOTE — Progress Notes (Signed)
Subjective:    Patient ID: Ruben West, male    DOB: 06/26/1955, 60 y.o.   MRN: [id number]  HPI Pt is here for regular wellness examination, and is feeling pretty well in general, and says chronic med probs are stable, except as noted below Past Medical History  Diagnosis Date  . Hypercholesteremia   . ALLERGIC RHINITIS 11/23/2006  . HYPERTENSION 11/23/2006  . HYPERGLYCEMIA 04/04/2007  . FATTY LIVER DISEASE 05/09/2008  . 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 Migraines   . NASH (nonalcoholic steatohepatitis)   . PUD (peptic ulcer disease)   . GERD 04/04/2007    no per pt  . Heart murmur   . Back pain     Past Surgical History  Procedure Laterality Date  . Vasectomy    . Electrocardiogram  02/21/2006  . Colonoscopy      Social History   Social History  . Marital Status: Married    Spouse Name: N/A  . Number of Children: N/A  . 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 Smoking status: Never Smoker   . Smokeless tobacco: Never Used  . Alcohol Use: 0.0 oz/week    0 Standard drinks or equivalent per week     Comment: Rarely  . Drug Use: No  . Sexual Activity: Not on file   Other Topics Concern  . Not on file   Social History Narrative   Lives with wife in a one story home.  Has 2 grandchildren living with him.     Works as a Administrator.     Education: 10th grade.         Current Outpatient Prescriptions on File Prior to Visit  Medication Sig Dispense Refill  . aspirin EC 81 MG tablet Take 81 mg by mouth every morning.    Marland Kitchen losartan-hydrochlorothiazide (HYZAAR) 50-12.5 MG tablet TAKE 1 TABLET BY MOUTH DAILY 90 tablet 2  . lovastatin (MEVACOR) 40 MG tablet TAKE 1 TABLET BY MOUTH AT BEDTIME 90 tablet 2   No current facility-administered medications on file prior to visit.    No Known Allergies  Family History  Problem Relation Age of Onset  . Heart attack  Mother     MI, deceased 102  . Cancer Neg Hx     no cancer in immediate family  . Colon cancer Neg Hx   . Esophageal cancer Neg Hx   . Rectal cancer Neg Hx   . Stomach cancer Neg Hx   . Emphysema Father     Deceased, 61  . Healthy Daughter     x2  . Seizures Grandchild   . Seizures Mother     BP 124/80 mmHg  Pulse 60  Temp(Src) 97.9 F (36.6 C) (Oral)  Resp 20  Ht 5\' 10"  (1.778 m)  Wt 241 lb (109.317 kg)  BMI 34.58 kg/m2  SpO2 99%  Review of Systems  Constitutional: Positive for unexpected weight change. Negative for fever.  HENT: Negative for hearing loss.   Eyes: Negative for visual disturbance.  Respiratory: Negative for shortness of breath.   Cardiovascular: Negative for chest pain.  Gastrointestinal: Negative for blood in stool.  Endocrine: Negative for cold intolerance.  Genitourinary: Negative for hematuria.  Musculoskeletal: Negative for back pain.  Skin: Negative for rash.       The nodule at the right temporal area irritates his eyeglasses  Allergic/Immunologic: Positive  for environmental allergies.  Neurological: Negative for syncope and numbness.  Hematological: Does not bruise/bleed easily.  Psychiatric/Behavioral: Negative for dysphoric mood.       Objective:   Physical Exam VS: see vs page GEN: no distress HEAD: head: no deformity eyes: no periorbital swelling, no proptosis external nose and ears are normal mouth: no lesion seen NECK: supple, thyroid is not enlarged CHEST WALL: no deformity LUNGS: clear to auscultation BREASTS:  No gynecomastia CV: reg rate and rhythm, no murmur ABD: abdomen is soft, nontender.  no hepatosplenomegaly.  not distended.  no hernia  RECTAL/PROSTATE: declined MUSCULOSKELETAL: muscle bulk and strength are grossly normal.  no obvious joint swelling.  gait is normal and steady EXTEMITIES: no deformity.  no ulcer on the feet.  feet are of normal color and temp.  no edema PULSES: dorsalis pedis intact bilat.  no  carotid bruit NEURO:  cn 2-12 grossly intact.   readily moves all 4's.  sensation is intact to touch on the feet SKIN:  Normal texture and temperature.  No rash is visible.  There is a few mm nodule at the right temporal area   NODES:  None palpable at the neck PSYCH: alert, well-oriented.  Does not appear anxious nor depressed.   i personally reviewed electrocardiogram tracing (today): Indication: wellness Impression: normal  Right temporal skin nodule is shaved consent obtained, signed form on chart The area is first sprayed with cooling agent local: xylocaine 2%, with epinephrine prep: alcohol pad The nodule is shaved to skin level with a dermablade no complications    Assessment & Plan:  Wellness visit today, with problems stable, except as noted.  Patient is advised the following: Patient Instructions  blood tests are requested for you today.  We'll let you know about the results. please consider these measures for your health:  minimize alcohol.  do not use tobacco products.  have a colonoscopy at least every 10 years from age 37.  Women should have an annual mammogram from age 54.  keep firearms safely stored.  always use seat belts.  have working smoke alarms in your home.  see an eye doctor and dentist regularly.  never drive under the influence of alcohol or drugs (including prescription drugs).  those with fair skin should take precautions against the sun.  We'll let you know about the result of the skin nodule, also Please return in 1 year.

## 2015-06-11 LAB — HIV ANTIBODY (ROUTINE TESTING W REFLEX): HIV: NONREACTIVE

## 2015-06-16 NOTE — Progress Notes (Signed)
   Subjective:    Patient ID: Ruben West, male    DOB: [birth year], 60 y.o.   MRN: [id number]  HPI    Review of Systems     Objective:   Physical Exam Should say: there is a 2 mm nodule at the right temporal area       Assessment & Plan:

## 2015-07-13 ENCOUNTER — Other Ambulatory Visit: Payer: Self-pay | Admitting: Endocrinology

## 2015-07-14 NOTE — Telephone Encounter (Signed)
Please advise if ok to refill. Medication is listed under a historical provider.  Thanks!  

## 2015-09-05 ENCOUNTER — Ambulatory Visit (INDEPENDENT_AMBULATORY_CARE_PROVIDER_SITE_OTHER): Payer: Managed Care, Other (non HMO) | Admitting: Family Medicine

## 2015-09-05 ENCOUNTER — Ambulatory Visit (INDEPENDENT_AMBULATORY_CARE_PROVIDER_SITE_OTHER): Payer: Managed Care, Other (non HMO)

## 2015-09-05 VITALS — BP 128/90 | HR 86 | Temp 97.9°F | Resp 16 | Ht 69.5 in | Wt 233.8 lb

## 2015-09-05 DIAGNOSIS — R059 Cough, unspecified: Secondary | ICD-10-CM

## 2015-09-05 DIAGNOSIS — R05 Cough: Secondary | ICD-10-CM | POA: Diagnosis not present

## 2015-09-05 DIAGNOSIS — H8110 Benign paroxysmal vertigo, unspecified ear: Secondary | ICD-10-CM

## 2015-09-05 LAB — POCT CBC
GRANULOCYTE PERCENT: 64.5 % (ref 37–80)
HCT, POC: 44 % (ref 43.5–53.7)
Hemoglobin: 15.6 g/dL (ref 14.1–18.1)
Lymph, poc: 2.4 (ref 0.6–3.4)
MCH: 30.4 pg (ref 27–31.2)
MCHC: 35.4 g/dL (ref 31.8–35.4)
MCV: 86 fL (ref 80–97)
MID (CBC): 0.4 (ref 0–0.9)
MPV: 7.4 fL (ref 0–99.8)
PLATELET COUNT, POC: 199 10*3/uL (ref 142–424)
POC Granulocyte: 5 (ref 2–6.9)
POC LYMPH PERCENT: 30.9 %L (ref 10–50)
POC MID %: 4.6 %M (ref 0–12)
RBC: 5.12 M/uL (ref 4.69–6.13)
RDW, POC: 12.7 %
WBC: 7.8 10*3/uL (ref 4.6–10.2)

## 2015-09-05 MED ORDER — AZITHROMYCIN 250 MG PO TABS: 250.0000 mg | ORAL_TABLET | Freq: Every day | ORAL | Status: DC

## 2015-09-05 MED ORDER — GUAIFENESIN-CODEINE 100-10 MG/5ML PO SOLN: 5.0000 mL | Freq: Every evening | ORAL | Status: DC | PRN

## 2015-09-05 MED ORDER — MECLIZINE HCL 25 MG PO TABS: 25.0000 mg | ORAL_TABLET | Freq: Three times a day (TID) | ORAL | Status: DC | PRN

## 2015-09-05 MED ORDER — IPRATROPIUM BROMIDE 0.06 % NA SOLN: 2.0000 | NASAL | Status: DC | PRN

## 2015-09-05 MED ORDER — PREDNISONE 10 MG PO TABS: 30.0000 mg | ORAL_TABLET | Freq: Every day | ORAL | Status: DC

## 2015-09-05 NOTE — Progress Notes (Signed)
Ruben West is a 60 y.o. male who presents to [facility] today for cough congestion headaches aches and diarrhea. Symptoms present for about one week. He's tried some over-the-counter medicines which have not helped much. He denies any significant fevers or chills significant shortness of breath or abdominal pain or vomiting. He does note some occasional dizziness. He describes this as a room spinning sensation with head motion. He's had pneumonia in the past and notes that his current symptoms do not entirely consistent with pneumonia.   Past Medical History  Diagnosis Date  . Hypercholesteremia   . ALLERGIC RHINITIS 11/23/2006  . HYPERTENSION 11/23/2006  . HYPERGLYCEMIA 04/04/2007  . FATTY LIVER DISEASE 05/09/2008  . 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 Migraines   . NASH (nonalcoholic steatohepatitis)   . PUD (peptic ulcer disease)   . GERD 04/04/2007    no per pt  . Heart murmur   . Back pain    Past Surgical History  Procedure Laterality Date  . Vasectomy    . Electrocardiogram  02/21/2006  . Colonoscopy     Social History  Substance Use Topics  . Smoking status: Never Smoker   . Smokeless tobacco: Never Used  . Alcohol Use: 0.0 oz/week    0 Standard drinks or equivalent per week     Comment: Rarely   ROS as above Medications: Current Outpatient Prescriptions  Medication Sig Dispense Refill  . aspirin EC 81 MG tablet Take 81 mg by mouth every morning.    Marland Kitchen losartan-hydrochlorothiazide (HYZAAR) 50-12.5 MG tablet TAKE 1 TABLET BY MOUTH DAILY 90 tablet 2  . lovastatin (MEVACOR) 40 MG tablet TAKE 1 TABLET BY MOUTH AT BEDTIME 90 tablet 2  . azithromycin (ZITHROMAX) 250 MG tablet Take 1 tablet (250 mg total) by mouth daily. Take first 2 tablets together, then 1 every day until finished. 6 tablet 0  . guaiFENesin-codeine 100-10 MG/5ML syrup Take 5 mLs by mouth at bedtime as needed for cough. 120 mL 0  . ipratropium (ATROVENT)  0.06 % nasal spray Place 2 sprays into both nostrils every 4 (four) hours as needed for rhinitis. 10 mL 6  . meclizine (ANTIVERT) 25 MG tablet Take 1 tablet (25 mg total) by mouth 3 (three) times daily as needed for dizziness. 30 tablet 0  . predniSONE (DELTASONE) 10 MG tablet Take 3 tablets (30 mg total) by mouth daily with breakfast. 15 tablet 0  . SUMAtriptan (IMITREX) 100 MG tablet Reported on 09/05/2015  2  . SUMAtriptan (IMITREX) 100 MG tablet TAKE 1 TABLET BY MOUTH EVERY 2 HOURS AS NEEDED FOR MIGRAINE *DO NOT EXCEED 2 PER DAY (Patient not taking: Reported on 09/05/2015) 10 tablet 1   No current facility-administered medications for this visit.   No Known Allergies   Exam:  BP 128/90 mmHg  Pulse 86  Temp(Src) 97.9 F (36.6 C) (Oral)  Resp 16  Ht 5' 9.5" (1.765 m)  Wt 233 lb 12.8 oz (106.051 kg)  BMI 34.04 kg/m2  SpO2 97% Gen: Well NAD Nontoxic appearing HEENT: EOMI,  MMM clear nasal discharge. Posterior pharynx normal-appearing. Normal tympanic membranes bilaterally. Lungs: Normal work of breathing. CTABL Heart: RRR no MRG Abd: NABS, Soft. Nondistended, Nontender Exts: Brisk capillary refill, warm and well perfused.  Neuro: Alert and oriented normal balance coordination and gait. No nystagmus visible. Normal head motion.  Results for orders placed or performed in visit on 09/05/15 (from the past 24 hour(s))  POCT CBC  Status: None   Collection Time: 09/05/15  4:16 PM  Result Value Ref Range   WBC 7.8 4.6 - 10.2 K/uL   Lymph, poc 2.4 0.6 - 3.4   POC LYMPH PERCENT 30.9 10 - 50 %L   MID (cbc) 0.4 0 - 0.9   POC MID % 4.6 0 - 12 %M   POC Granulocyte 5.0 2 - 6.9   Granulocyte percent 64.5 37 - 80 %G   RBC 5.12 4.69 - 6.13 M/uL   Hemoglobin 15.6 14.1 - 18.1 g/dL   HCT, POC 44.0 43.5 - 53.7 %   MCV 86.0 80 - 97 fL   MCH, POC 30.4 27 - 31.2 pg   MCHC 35.4 31.8 - 35.4 g/dL   RDW, POC 12.7 %   Platelet Count, POC 199 142 - 424 K/uL   MPV 7.4 0 - 99.8 fL   Dg Chest 2  View  09/05/2015  CLINICAL DATA:  60 year old male with a history of cough EXAM: CHEST  2 VIEW COMPARISON:  01/12/2014 FINDINGS: Cardiomediastinal silhouette unchanged in size and contour. No evidence of pulmonary vascular congestion. No confluent airspace disease or pneumothorax. No pleural effusion. No displaced fracture. IMPRESSION: Negative for acute cardiopulmonary disease. Signed, Dulcy Fanny. Earleen Newport, DO [facility] Electronically Signed   By: Corrie Mckusick D.O.   On: 09/05/2015 16:04    Assessment and Plan: 60 y.o. male with viral bronchitis. Treatment with prednisone and Atrovent nasal spray and codeine cough syrup at night.  Additionally patient does have some very minimal vertigo. These meclizine as needed. Additionally referred to vestibular physical therapy if symptoms do not resolve spontaneously. Recheck or return sooner if needed.  Discussed warning signs or symptoms. Please see discharge instructions. Patient expresses understanding.

## 2015-09-05 NOTE — Patient Instructions (Addendum)
Thank you for coming in today. Call or go to the emergency room if you get worse, have trouble breathing, have chest pains, or palpitations.   Take prednisone and use the nasal spray.  Use the cough medicine at bedtime as needed.  Use the dizzy pills as needed.  Do not drive after taking the cough medicine or dizzy pills.  Take antibiotics if worse or if not better.  Call or go to the emergency room if you get worse, have trouble breathing, have chest pains, or palpitations.     Acute Bronchitis Bronchitis is inflammation of the airways that extend from the windpipe into the lungs (bronchi). The inflammation often causes mucus to develop. This leads to a cough, which is the most common symptom of bronchitis.  In acute bronchitis, the condition usually develops suddenly and goes away over time, usually in a couple weeks. Smoking, allergies, and asthma can make bronchitis worse. Repeated episodes of bronchitis may cause further lung problems.  CAUSES Acute bronchitis is most often caused by the same virus that causes a cold. The virus can spread from person to person (contagious) through coughing, sneezing, and touching contaminated objects. SIGNS AND SYMPTOMS   Cough.   Fever.   Coughing up mucus.   Body aches.   Chest congestion.   Chills.   Shortness of breath.   Sore throat.  DIAGNOSIS  Acute bronchitis is usually diagnosed through a physical exam. Your health care provider will also ask you questions about your medical history. Tests, such as chest X-rays, are sometimes done to rule out other conditions.  TREATMENT  Acute bronchitis usually goes away in a couple weeks. Oftentimes, no medical treatment is necessary. Medicines are sometimes given for relief of fever or cough. Antibiotic medicines are usually not needed but may be prescribed in certain situations. In some cases, an inhaler may be recommended to help reduce shortness of breath and control the cough. A  cool mist vaporizer may also be used to help thin bronchial secretions and make it easier to clear the chest.  HOME CARE INSTRUCTIONS  Get plenty of rest.   Drink enough fluids to keep your urine clear or pale yellow (unless you have a medical condition that requires fluid restriction). Increasing fluids may help thin your respiratory secretions (sputum) and reduce chest congestion, and it will prevent dehydration.   Take medicines only as directed by your health care provider.  If you were prescribed an antibiotic medicine, finish it all even if you start to feel better.  Avoid smoking and secondhand smoke. Exposure to cigarette smoke or irritating chemicals will make bronchitis worse. If you are a smoker, consider using nicotine gum or skin patches to help control withdrawal symptoms. Quitting smoking will help your lungs heal faster.   Reduce the chances of another bout of acute bronchitis by washing your hands frequently, avoiding people with cold symptoms, and trying not to touch your hands to your mouth, nose, or eyes.   Keep all follow-up visits as directed by your health care provider.  SEEK MEDICAL CARE IF: Your symptoms do not improve after 1 week of treatment.  SEEK IMMEDIATE MEDICAL CARE IF:  You develop an increased fever or chills.   You have chest pain.   You have severe shortness of breath.  You have bloody sputum.   You develop dehydration.  You faint or repeatedly feel like you are going to pass out.  You develop repeated vomiting.  You develop a severe headache. MAKE  SURE YOU:   Understand these instructions.  Will watch your condition.  Will get help right away if you are not doing well or get worse.   This information is not intended to replace advice given to you by your health care provider. Make sure you discuss any questions you have with your health care provider.   Document Released: 04/22/2004 Document Revised: 04/05/2014 Document  Reviewed: 09/05/2012 Elsevier Interactive Patient Education 2016 Elsevier Inc.  Benign Positional Vertigo Vertigo is the feeling that you or your surroundings are moving when they are not. Benign positional vertigo is the most common form of vertigo. The cause of this condition is not serious (is benign). This condition is triggered by certain movements and positions (is positional). This condition can be dangerous if it occurs while you are doing something that could endanger you or others, such as driving.  CAUSES In many cases, the cause of this condition is not known. It may be caused by a disturbance in an area of the inner ear that helps your brain to sense movement and balance. This disturbance can be caused by a viral infection (labyrinthitis), head injury, or repetitive motion. RISK FACTORS This condition is more likely to develop in:  Women.  People who are 3 years of age or older. SYMPTOMS Symptoms of this condition usually happen when you move your head or your eyes in different directions. Symptoms may start suddenly, and they usually last for less than a minute. Symptoms may include:  Loss of balance and falling.  Feeling like you are spinning or moving.  Feeling like your surroundings are spinning or moving.  Nausea and vomiting.  Blurred vision.  Dizziness.  Involuntary eye movement (nystagmus). Symptoms can be mild and cause only slight annoyance, or they can be severe and interfere with daily life. Episodes of benign positional vertigo may return (recur) over time, and they may be triggered by certain movements. Symptoms may improve over time. DIAGNOSIS This condition is usually diagnosed by medical history and a physical exam of the head, neck, and ears. You may be referred to a health care provider who specializes in ear, nose, and throat (ENT) problems (otolaryngologist) or a provider who specializes in disorders of the nervous system (neurologist). You may have  additional testing, including:  MRI.  A CT scan.  Eye movement tests. Your health care provider may ask you to change positions quickly while he or she watches you for symptoms of benign positional vertigo, such as nystagmus. Eye movement may be tested with an electronystagmogram (ENG), caloric stimulation, the Dix-Hallpike test, or the roll test.  An electroencephalogram (EEG). This records electrical activity in your brain.  Hearing tests. TREATMENT Usually, your health care provider will treat this by moving your head in specific positions to adjust your inner ear back to normal. Surgery may be needed in severe cases, but this is rare. In some cases, benign positional vertigo may resolve on its own in 2-4 weeks. HOME CARE INSTRUCTIONS Safety  Move slowly.Avoid sudden body or head movements.  Avoid driving.  Avoid operating heavy machinery.  Avoid doing any tasks that would be dangerous to you or others if a vertigo episode would occur.  If you have trouble walking or keeping your balance, try using a cane for stability. If you feel dizzy or unstable, sit down right away.  Return to your normal activities as told by your health care provider. Ask your health care provider what activities are safe for you. General Instructions  Take over-the-counter and prescription medicines only as told by your health care provider.  Avoid certain positions or movements as told by your health care provider.  Drink enough fluid to keep your urine clear or pale yellow.  Keep all follow-up visits as told by your health care provider. This is important. SEEK MEDICAL CARE IF:  You have a fever.  Your condition gets worse or you develop new symptoms.  Your family or friends notice any behavioral changes.  Your nausea or vomiting gets worse.  You have numbness or a "pins and needles" sensation. SEEK IMMEDIATE MEDICAL CARE IF:  You have difficulty speaking or moving.  You are always  dizzy.  You faint.  You develop severe headaches.  You have weakness in your legs or arms.  You have changes in your hearing or vision.  You develop a stiff neck.  You develop sensitivity to light.   This information is not intended to replace advice given to you by your health care provider. Make sure you discuss any questions you have with your health care provider.   Document Released: 12/21/2005 Document Revised: 12/04/2014 Document Reviewed: 07/08/2014 Elsevier Interactive Patient Education [employer].    IF you received an x-ray today, you will receive an invoice from [facility]. Please contact [facility] at [phone] with questions or concerns regarding your invoice.   IF you received labwork today, you will receive an invoice from [employer]. Please contact [facility] at [phone] with questions or concerns regarding your invoice.   Our billing staff will not be able to assist you with questions regarding bills from these companies.  You will be contacted with the lab results as soon as they are available. The fastest way to get your results is to activate your My Chart account. Instructions are located on the last page of this paperwork. If you have not heard from [name] regarding the results in 2 weeks, please contact this office.

## 2015-12-09 ENCOUNTER — Ambulatory Visit (INDEPENDENT_AMBULATORY_CARE_PROVIDER_SITE_OTHER): Payer: Managed Care, Other (non HMO) | Admitting: Endocrinology

## 2015-12-09 ENCOUNTER — Encounter: Payer: Self-pay | Admitting: Endocrinology

## 2015-12-09 DIAGNOSIS — H8110 Benign paroxysmal vertigo, unspecified ear: Secondary | ICD-10-CM

## 2015-12-09 MED ORDER — CEFUROXIME AXETIL 250 MG PO TABS: 250.0000 mg | ORAL_TABLET | Freq: Two times a day (BID) | ORAL | 0 refills | Status: AC

## 2015-12-09 MED ORDER — MECLIZINE HCL 25 MG PO TABS: 25.0000 mg | ORAL_TABLET | Freq: Three times a day (TID) | ORAL | 0 refills | Status: DC | PRN

## 2015-12-09 NOTE — Progress Notes (Signed)
Subjective:    Patient ID: Ruben West, male    DOB: 15-Aug-1955, 60 y.o.   MRN: [id number]  HPI Pt states 1 week of slight dizziness sensation in the head, and assoc nasal congestion.  He also has a prod cough.  He was see at [facility] a few mos ago.  He was rx'ed abx + antivert, which helped.   Past Medical History:  Diagnosis Date  . ALLERGIC RHINITIS 11/23/2006  . Back pain   . 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 FATTY LIVER DISEASE 05/09/2008  . GERD 04/04/2007   no per pt  . Heart murmur   . Hypercholesteremia   . HYPERGLYCEMIA 04/04/2007  . HYPERTENSION 11/23/2006  . Migraines   . NASH (nonalcoholic steatohepatitis)   . 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 PUD (peptic ulcer disease)     Past Surgical History:  Procedure Laterality Date  . COLONOSCOPY    . ELECTROCARDIOGRAM  02/21/2006  . VASECTOMY      Social History   Social History  . Marital status: Married    Spouse name: N/A  . Number of children: N/A  . Years of education: N/A   Occupational History  . [employer]   Social History Main Topics  . Smoking status: Never Smoker  . Smokeless tobacco: Never Used  . Alcohol use 0.0 oz/week     Comment: Rarely  . Drug use: No  . Sexual activity: Not on file   Other Topics Concern  . Not on file   Social History Narrative   Lives with wife in a one story home.  Has 2 grandchildren living with him.     Works as a Administrator.     Education: 10th grade.         Current Outpatient Prescriptions on File Prior to Visit  Medication Sig Dispense Refill  . aspirin EC 81 MG tablet Take 81 mg by mouth every morning.    Marland Kitchen losartan-hydrochlorothiazide (HYZAAR) 50-12.5 MG tablet TAKE 1 TABLET BY MOUTH DAILY 90 tablet 2  . lovastatin (MEVACOR) 40 MG tablet TAKE 1 TABLET BY MOUTH AT BEDTIME 90 tablet 2  . SUMAtriptan (IMITREX) 100 MG tablet TAKE 1 TABLET BY MOUTH EVERY 2 HOURS AS NEEDED FOR MIGRAINE *DO NOT EXCEED 2 PER DAY 10 tablet 1   No  current facility-administered medications on file prior to visit.     No Known Allergies  Family History  Problem Relation Age of Onset  . Heart attack Mother     MI, deceased 10  . Cancer Neg Hx     no cancer in immediate family  . Colon cancer Neg Hx   . Esophageal cancer Neg Hx   . Rectal cancer Neg Hx   . Stomach cancer Neg Hx   . Emphysema Father     Deceased, 69  . Healthy Daughter     x2  . Seizures Grandchild   . Seizures Mother     BP 132/86   Pulse 70   Temp 98 F (36.7 C) (Oral)   Ht 5' 9.5" (1.765 m)   Wt 230 lb (104.3 kg)   SpO2 96%   BMI 33.48 kg/m    Review of Systems Denies fever and LOC.     Objective:   Physical Exam VITAL SIGNS:  See vs page GENERAL: no distress head: no deformity  eyes: no periorbital swelling, no proptosis  external nose and ears are normal.   mouth: no lesion seen  Both eac's and tm's are normal. LUNGS:  Clear to auscultation.        Assessment & Plan:  URI: new Vertigo, recurrent, apparently assoc with URI

## 2015-12-09 NOTE — Patient Instructions (Addendum)
I have sent a prescription to your pharmacy, for an antibiotic pill.   Loratadine-d (non-prescription) will help your congestion.   Also, I have sent a prescription to your pharmacy, to refill the meclizine.   Please continue the same medication for blood pressure.   I hope you feel better soon.  If you don't feel better by next week, please call back.  Please call sooner if you get worse.   If this keeps happening, you should see your neurologist Dr Posey Pronto for this.

## 2015-12-10 DIAGNOSIS — H8110 Benign paroxysmal vertigo, unspecified ear: Secondary | ICD-10-CM | POA: Insufficient documentation

## 2015-12-12 ENCOUNTER — Telehealth: Payer: Self-pay | Admitting: Endocrinology

## 2015-12-12 NOTE — Telephone Encounter (Signed)
Patient wife is calling on the status of husband form stated he is able to function. Please Advise he ia at work as we speak.

## 2015-12-12 NOTE — Telephone Encounter (Signed)
See message, Patient needs to completed before we close today.

## 2015-12-12 NOTE — Telephone Encounter (Signed)
please call patient: I did form. Do not work within 6 hrs of taking meclizine

## 2015-12-12 NOTE — Telephone Encounter (Signed)
please call patient: I have received form.  Do you believe you can do these functions?

## 2015-12-12 NOTE — Telephone Encounter (Signed)
I contacted the patient and advised of message. Pateint voiced understanding, but stated his boss had already left for the day and to please fax the form on Monday (12/15/2015). Fax will be sent on 12/15/2015.

## 2015-12-12 NOTE — Telephone Encounter (Signed)
Patient is calling on the status pf his work not did Dr Loanne Drilling receive it and fax it back yet please advise

## 2015-12-12 NOTE — Telephone Encounter (Signed)
Ok, I'll do the form

## 2015-12-12 NOTE — Telephone Encounter (Signed)
I contacted the patient's wife and advised of message. She stated the patient had discussed the from with her and he is able to the functions required on the forms.

## 2015-12-12 NOTE — Telephone Encounter (Signed)
See message,  Paper was received this morning.

## 2015-12-15 NOTE — Telephone Encounter (Signed)
Pateint came by the office and picked up the forms this morning.

## 2015-12-15 NOTE — Telephone Encounter (Signed)
Pt has come into the office today and has stated that on 12/10/15 in the AM he did take his AM dose of Meclizine but has not taken any of the Meclizine since then.

## 2015-12-15 NOTE — Telephone Encounter (Signed)
done

## 2015-12-31 NOTE — Progress Notes (Signed)
[facility] [address] [place], [place] [address] Phone: [phone] Subjective:    I'm seeing this patient by the request  of:  Renato Shin, MD   CC: Knee pain  [id number]  Ruben West is a 60 y.o. male coming in with complaint of knee pain. Seems to be bilateral. Patient started a job couple months ago and is doing a significant amount of repetitive activities. Has to take steps a lot more. Getting down on his knees. Noticing more pain. States that after sitting for long amount of time takes quite a while for his knees to relax. Past medical history significant for a lateral meniscal injury with surgery on the left side 2 years ago. Patient was told at that time he did have some arthritis. Patient states in daily activity such as going up and downstairs has been more difficult. Sometimes uncomfortable at night. Denies any radiation down the legs any numbness. Rates the severity pain is 8 out of 10 now. Does respond somewhat anti-inflammatories.     Past Medical History:  Diagnosis Date  . ALLERGIC RHINITIS 11/23/2006  . Back pain   . 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 FATTY LIVER DISEASE 05/09/2008  . GERD 04/04/2007   no per pt  . Heart murmur   . Hypercholesteremia   . HYPERGLYCEMIA 04/04/2007  . HYPERTENSION 11/23/2006  . Migraines   . NASH (nonalcoholic steatohepatitis)   . 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 PUD (peptic ulcer disease)    Past Surgical History:  Procedure Laterality Date  . COLONOSCOPY    . ELECTROCARDIOGRAM  02/21/2006  . VASECTOMY     Social History   Social History  . Marital status: Married    Spouse name: N/A  . Number of children: N/A  . Years of education: N/A   Occupational History  . [employer]   Social History Main Topics  . Smoking status: Never Smoker  . Smokeless tobacco: Never Used  . Alcohol use 0.0 oz/week     Comment: Rarely  . Drug use: No  . Sexual activity:  Not Asked   Other Topics Concern  . None   Social History Narrative   Lives with wife in a one story home.  Has 2 grandchildren living with him.     Works as a Administrator.     Education: 10th grade.        No Known Allergies Family History  Problem Relation Age of Onset  . Heart attack Mother     MI, deceased 35  . Cancer Neg Hx     no cancer in immediate family  . Colon cancer Neg Hx   . Esophageal cancer Neg Hx   . Rectal cancer Neg Hx   . Stomach cancer Neg Hx   . Emphysema Father     Deceased, 70  . Healthy Daughter     x2  . Seizures Grandchild   . Seizures Mother     Past medical history, social, surgical and family history all reviewed in electronic medical record.  No pertanent information unless stated regarding to the chief complaint.   Review of Systems: No headache, visual changes, nausea, vomiting, diarrhea, constipation, dizziness, abdominal pain, skin rash, fevers, chills, night sweats, weight loss, swollen lymph nodes, body aches, joint swelling, muscle aches, chest pain, shortness of breath, mood changes.   Objective  Blood pressure 118/82, pulse 63, weight 223 lb (101.2 kg), SpO2 97 %.  General:  No apparent distress alert and oriented x3 mood and affect normal, dressed appropriately.  HEENT: Pupils equal, extraocular movements intact  Respiratory: Patient's speak in full sentences and does not appear short of breath  Cardiovascular: No lower extremity edema, non tender, no erythema  Skin: Warm dry intact with no signs of infection or rash on extremities or on axial skeleton.  Abdomen: Soft nontender  Neuro: Cranial nerves II through XII are intact, neurovascularly intact in all extremities with 2+ DTRs and 2+ pulses.  Lymph: No lymphadenopathy of posterior or anterior cervical chain or axillae bilaterally.  Gait Mild antalgic gait MSK:  Non tender with full range of motion and good stability and symmetric strength and tone of shoulders, elbows, wrist,  hip, and ankles bilaterally.  Knee: Bilateral Mild valgus deformity of the knees bilaterally Tender to palpation over the medial joint line bilaterally ROM full in flexion and extension and lower leg rotation. Mild instability with valgus force Mild positive Mcmurray's, Apley's, and Thessalonian tests. Non painful patellar compression. Patellar glide mild crepitus. Patellar and quadriceps tendons unremarkable. Hamstring and quadriceps strength is normal.   After informed written and verbal consent, patient was seated on exam table. Right knee was prepped with alcohol swab and utilizing anterolateral approach, patient's right knee space was injected with 4:1  marcaine 0.5%: Kenalog 40mg /dL. Patient tolerated the procedure well without immediate complications.  After informed written and verbal consent, patient was seated on exam table. Left knee was prepped with alcohol swab and utilizing anterolateral approach, patient's left knee space was injected with 4:1  marcaine 0.5%: Kenalog 40mg /dL. Patient tolerated the procedure well without immediate complications.   Impression and Recommendations:     This case required medical decision making of moderate complexity.      Note: This dictation was prepared with Dragon dictation along with smaller phrase technology. Any transcriptional errors that result from this process are unintentional.

## 2016-01-01 ENCOUNTER — Ambulatory Visit (INDEPENDENT_AMBULATORY_CARE_PROVIDER_SITE_OTHER): Payer: Managed Care, Other (non HMO) | Admitting: Family Medicine

## 2016-01-01 ENCOUNTER — Encounter: Payer: Self-pay | Admitting: Family Medicine

## 2016-01-01 ENCOUNTER — Ambulatory Visit (INDEPENDENT_AMBULATORY_CARE_PROVIDER_SITE_OTHER)
Admission: RE | Admit: 2016-01-01 | Discharge: 2016-01-01 | Disposition: A | Discharge status: Home / Self Care | Payer: Managed Care, Other (non HMO) | Source: Ambulatory Visit | Attending: Family Medicine | Admitting: Family Medicine

## 2016-01-01 VITALS — BP 118/82 | HR 63 | Wt 223.0 lb

## 2016-01-01 DIAGNOSIS — M25561 Pain in right knee: Secondary | ICD-10-CM | POA: Diagnosis not present

## 2016-01-01 DIAGNOSIS — M25562 Pain in left knee: Secondary | ICD-10-CM | POA: Diagnosis not present

## 2016-01-01 DIAGNOSIS — M170 Bilateral primary osteoarthritis of knee: Secondary | ICD-10-CM | POA: Diagnosis not present

## 2016-01-01 MED ORDER — DICLOFENAC SODIUM 2 % TD SOLN: 2.0000 "application " | Freq: Two times a day (BID) | TRANSDERMAL | 3 refills | Status: DC

## 2016-01-01 MED ORDER — VITAMIN D (ERGOCALCIFEROL) 1.25 MG (50000 UNIT) PO CAPS: 50000.0000 [IU] | ORAL_CAPSULE | ORAL | 0 refills | Status: DC

## 2016-01-01 MED ORDER — DICLOFENAC SODIUM 2 % TD SOLN: TRANSDERMAL | 3 refills | Status: DC

## 2016-01-01 NOTE — Assessment & Plan Note (Signed)
Patient given bilateral injections today. Tolerated the procedure well. We discussed icing regimen and home exercises. We discussed which activities to do in which ones to potentially avoid. Patient was given topical anti-inflammatories. Patient will continue to be active. Patient will come back and see me again in 4-6 weeks. He could be a candidate for formal physical therapy or viscous supple mentation.

## 2016-01-01 NOTE — Patient Instructions (Addendum)
Good to see you.  Ice 20 minutes 2 times daily. Usually after activity and before bed. Exercises 3 times a week.  Xray downstairs Would avoid direct impact on the knees.  Once weekly vitamin D for next 12 weeks.  pennsaid pinkie amount topically 2 times daily as needed.   See me again in 4-6 weeks.

## 2016-01-14 NOTE — Progress Notes (Signed)
[facility] [place], [place] [address] Phone: [phone] Subjective:     CC: Knee pain Bilateral follow-up  [id number]  Ruben West is a 60 y.o. male coming in with complaint of knee pain. Seems to be bilateral. 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 Patient was given injections for this as well as some chronic meniscal tears. Patient was to do some home exercises and topical anti-inflammatories. Patient was to do once weekly vitamin D. Patient states No significant improvement. Patient has been doing less at work and that seems to be improving until a little bit. Patient states that the injection did not seem to help him.worsening pain that is stopping him from even working on a regular basis. States going up and down stairs or getting on his knees as severe amount of pain.   x-rays were ordered and independently visualized by me. X-rays on October 5 showed the patient did have mild to moderate osteophytic narrowing of the medial joint bilaterally  Past Medical History:  Diagnosis Date  . ALLERGIC RHINITIS 11/23/2006  . Back pain   . 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 FATTY LIVER DISEASE 05/09/2008  . GERD 04/04/2007   no per pt  . Heart murmur   . Hypercholesteremia   . HYPERGLYCEMIA 04/04/2007  . HYPERTENSION 11/23/2006  . Migraines   . NASH (nonalcoholic steatohepatitis)   . 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 PUD (peptic ulcer disease)    Past Surgical History:  Procedure Laterality Date  . COLONOSCOPY    . ELECTROCARDIOGRAM  02/21/2006  . VASECTOMY     Social History   Social History  . Marital status: Married    Spouse name: N/A  . Number of children: N/A  . Years of education: N/A   Occupational History  . Trucker [employer]   Social History Main Topics  . Smoking status: Never Smoker  . Smokeless tobacco: Never Used  . Alcohol use 0.0 oz/week     Comment:  Rarely  . Drug use: No  . Sexual activity: Not Asked   Other Topics Concern  . None   Social History Narrative   Lives with wife in a one story home.  Has 2 grandchildren living with him.     Works as a Administrator.     Education: 10th grade.        No Known Allergies Family History  Problem Relation Age of Onset  . Heart attack Mother     MI, deceased 56  . Cancer Neg Hx     no cancer in immediate family  . Colon cancer Neg Hx   . Esophageal cancer Neg Hx   . Rectal cancer Neg Hx   . Stomach cancer Neg Hx   . Emphysema Father     Deceased, 47  . Healthy Daughter     x2  . Seizures Grandchild   . Seizures Mother     Past medical history, social, surgical and family history all reviewed in electronic medical record.  No pertanent information unless stated regarding to the chief complaint.   Review of Systems: No headache, visual changes, nausea, vomiting, diarrhea, constipation, dizziness, abdominal pain, skin rash, fevers, chills, night sweats, weight loss, swollen lymph nodes, chest pain, shortness of breath, mood changes.   Objective  Blood pressure 110/80, pulse 64, weight 226 lb (102.5 kg), SpO2 95 %.  General: No apparent distress alert and oriented x3  mood and affect normal, dressed appropriately.  HEENT: Pupils equal, extraocular movements intact  Respiratory: Patient's speak in full sentences and does not appear short of breath  Cardiovascular: No lower extremity edema, non tender, no erythema  Skin: Warm dry intact with no signs of infection or rash on extremities or on axial skeleton.  Abdomen: Soft nontender  Neuro: Cranial nerves II through XII are intact, neurovascularly intact in all extremities with 2+ DTRs and 2+ pulses.  Lymph: No lymphadenopathy of posterior or anterior cervical chain or axillae bilaterally.  Gait Mild antalgic gait MSK:  Non tender with full range of motion and good stability and symmetric strength and tone of shoulders, elbows,  wrist, hip, and ankles bilaterally.  Knee: Bilateral Mild valgus deformity of the knees bilaterally Tender to palpation over the medial joint line bilaterally ROM full in flexion and extension and lower leg rotation. Mild instability with valgus force Mild positive Mcmurray's, Apley's, and Thessalonian tests. Non painful patellar compression. Patellar glide mild crepitus. Patellar and quadriceps tendons unremarkable. Hamstring and quadriceps strength is normal.  No change from previous exam  After informed written and verbal consent, patient was seated on exam table. Right knee was prepped with alcohol swab and utilizing anterolateral approach, patient's right knee space was injected with Monovisc 22 mg per 1 mL injection with 21-gauge 2 inch needle Patient tolerated the procedure well without immediate complications.  After informed written and verbal consent, patient was seated on exam table. Left knee was prepped with alcohol swab and utilizing anterolateral approach, patient's left knee space was injected with Monovisc 22 mg per 1 mL injection with 21-gauge 2 inch needle . Patient tolerated the procedure well without immediate complications.   Impression and Recommendations:     This case required medical decision making of moderate complexity.      Note: This dictation was prepared with Dragon dictation along with smaller phrase technology. Any transcriptional errors that result from this process are unintentional.

## 2016-01-15 ENCOUNTER — Ambulatory Visit (INDEPENDENT_AMBULATORY_CARE_PROVIDER_SITE_OTHER): Payer: Managed Care, Other (non HMO) | Admitting: Family Medicine

## 2016-01-15 ENCOUNTER — Encounter: Payer: Self-pay | Admitting: Family Medicine

## 2016-01-15 VITALS — BP 110/80 | HR 64 | Wt 226.0 lb

## 2016-01-15 DIAGNOSIS — M170 Bilateral primary osteoarthritis of knee: Secondary | ICD-10-CM

## 2016-01-15 DIAGNOSIS — M25562 Pain in left knee: Secondary | ICD-10-CM | POA: Diagnosis not present

## 2016-01-15 DIAGNOSIS — G8929 Other chronic pain: Secondary | ICD-10-CM | POA: Diagnosis not present

## 2016-01-15 DIAGNOSIS — M25561 Pain in right knee: Secondary | ICD-10-CM

## 2016-01-15 MED ORDER — PREDNISONE 50 MG PO TABS: 50.0000 mg | ORAL_TABLET | Freq: Every day | ORAL | 0 refills | Status: DC

## 2016-01-15 NOTE — Assessment & Plan Note (Signed)
Worsening symptoms.Bilateral Monovisc injections given today. Sent to formal physical therapy that I think would be beneficial. Given prednisone for 5 days. Warned of potential side effects. Follow-up again in 4 weeks

## 2016-01-15 NOTE — Patient Instructions (Signed)
Good to see you  I am sorry not better  We will get you into PT Gave you monovisc injections and can take up to a month to work  Prednisone daily for 5 days  Get back to the other job See me again in 4 weeks.

## 2016-01-21 ENCOUNTER — Telehealth: Payer: Self-pay | Admitting: Emergency Medicine

## 2016-01-21 NOTE — Telephone Encounter (Signed)
[employer] is going to be faxing you a new form to fill out. The wrong form was sent the first time. Thanks.

## 2016-01-23 NOTE — Telephone Encounter (Signed)
Form completed faxed

## 2016-02-02 ENCOUNTER — Other Ambulatory Visit: Payer: Self-pay | Admitting: Endocrinology

## 2016-02-13 ENCOUNTER — Encounter: Payer: Self-pay | Admitting: Family Medicine

## 2016-02-13 ENCOUNTER — Ambulatory Visit (INDEPENDENT_AMBULATORY_CARE_PROVIDER_SITE_OTHER): Payer: Managed Care, Other (non HMO) | Admitting: Family Medicine

## 2016-02-13 VITALS — BP 140/84 | HR 68 | Temp 98.3°F | Resp 18 | Wt 233.0 lb

## 2016-02-13 DIAGNOSIS — J301 Allergic rhinitis due to pollen: Secondary | ICD-10-CM

## 2016-02-13 DIAGNOSIS — Z23 Encounter for immunization: Secondary | ICD-10-CM

## 2016-02-13 DIAGNOSIS — J0101 Acute recurrent maxillary sinusitis: Secondary | ICD-10-CM

## 2016-02-13 MED ORDER — AMOXICILLIN 500 MG PO TABS: 1000.0000 mg | ORAL_TABLET | Freq: Two times a day (BID) | ORAL | 0 refills | Status: DC

## 2016-02-13 MED ORDER — FLUTICASONE PROPIONATE 50 MCG/ACT NA SUSP: 2.0000 | Freq: Every day | NASAL | 6 refills | Status: DC

## 2016-02-13 NOTE — Patient Instructions (Signed)
Take Nasal saline spray daily to twice daily.      IF you received an x-ray today, you will receive an invoice from [facility]. Please contact [facility] at [phone] with questions or concerns regarding your invoice.   IF you received labwork today, you will receive an invoice from [employer]. Please contact [facility] at [phone] with questions or concerns regarding your invoice.   Our billing staff will not be able to assist you with questions regarding bills from these companies.  You will be contacted with the lab results as soon as they are available. The fastest way to get your results is to activate your My Chart account. Instructions are located on the last page of this paperwork. If you have not heard from [name] regarding the results in 2 weeks, please contact this office.      Sinusitis, Adult Sinusitis is soreness and inflammation of your sinuses. Sinuses are hollow spaces in the bones around your face. Your sinuses are located:  Around your eyes.  In the middle of your forehead.  Behind your nose.  In your cheekbones. Your sinuses and nasal passages are lined with a stringy fluid (mucus). Mucus normally drains out of your sinuses. When your nasal tissues become inflamed or swollen, the mucus can become trapped or blocked so air cannot flow through your sinuses. This allows bacteria, viruses, and funguses to grow, which leads to infection. Sinusitis can develop quickly and last for 7?10 days (acute) or for more than 12 weeks (chronic). Sinusitis often develops after a cold. What are the causes? This condition is caused by anything that creates swelling in the sinuses or stops mucus from draining, including:  Allergies.  Asthma.  Bacterial or viral infection.  Abnormally shaped bones between the nasal passages.  Nasal growths that contain mucus (nasal polyps).  Narrow sinus openings.  Pollutants, such as chemicals  or irritants in the air.  A foreign object stuck in the nose.  A fungal infection. This is rare. What increases the risk? The following factors may make you more likely to develop this condition:  Having allergies or asthma.  Having had a recent cold or respiratory tract infection.  Having structural deformities or blockages in your nose or sinuses.  Having a weak immune system.  Doing a lot of swimming or diving.  Overusing nasal sprays.  Smoking. What are the signs or symptoms? The main symptoms of this condition are pain and a feeling of pressure around the affected sinuses. Other symptoms include:  Upper toothache.  Earache.  Headache.  Bad breath.  Decreased sense of smell and taste.  A cough that may get worse at night.  Fatigue.  Fever.  Thick drainage from your nose. The drainage is often green and it may contain pus (purulent).  Stuffy nose or congestion.  Postnasal drip. This is when extra mucus collects in the throat or back of the nose.  Swelling and warmth over the affected sinuses.  Sore throat.  Sensitivity to light. How is this diagnosed? This condition is diagnosed based on symptoms, a medical history, and a physical exam. To find out if your condition is acute or chronic, your health care provider may:  Look in your nose for signs of nasal polyps.  Tap over the affected sinus to check for signs of infection.  View the inside of your sinuses using an imaging device that has a light attached (endoscope). If your health care provider suspects that you have chronic sinusitis,  you may also:  Be tested for allergies.  Have a sample of mucus taken from your nose (nasal culture) and checked for bacteria.  Have a mucus sample examined to see if your sinusitis is related to an allergy. If your sinusitis does not respond to treatment and it lasts longer than 8 weeks, you may have an MRI or CT scan to check your sinuses. These scans also help to  determine how severe your infection is. In rare cases, a bone biopsy may be done to rule out more serious types of fungal sinus disease. How is this treated? Treatment for sinusitis depends on the cause and whether your condition is chronic or acute. If a virus is causing your sinusitis, your symptoms will go away on their own within 10 days. You may be given medicines to relieve your symptoms, including:  Topical nasal decongestants. They shrink swollen nasal passages and let mucus drain from your sinuses.  Antihistamines. These drugs block inflammation that is triggered by allergies. This can help to ease swelling in your nose and sinuses.  Topical nasal corticosteroids. These are nasal sprays that ease inflammation and swelling in your nose and sinuses.  Nasal saline washes. These rinses can help to get rid of thick mucus in your nose. If your condition is caused by bacteria, you will be given an antibiotic medicine. If your condition is caused by a fungus, you will be given an antifungal medicine. Surgery may be needed to correct underlying conditions, such as narrow nasal passages. Surgery may also be needed to remove polyps. Follow these instructions at home: Medicines  Take, use, or apply over-the-counter and prescription medicines only as told by your health care provider. These may include nasal sprays.  If you were prescribed an antibiotic medicine, take it as told by your health care provider. Do not stop taking the antibiotic even if you start to feel better. Hydrate and Humidify  Drink enough water to keep your urine clear or pale yellow. Staying hydrated will help to thin your mucus.  Use a cool mist humidifier to keep the humidity level in your home above 50%.  Inhale steam for 10-15 minutes, 3-4 times a day or as told by your health care provider. You can do this in the bathroom while a hot shower is running.  Limit your exposure to cool or dry air. Rest  Rest as much  as possible.  Sleep with your head raised (elevated).  Make sure to get enough sleep each night. General instructions  Apply a warm, moist washcloth to your face 3-4 times a day or as told by your health care provider. This will help with discomfort.  Wash your hands often with soap and water to reduce your exposure to viruses and other germs. If soap and water are not available, use hand sanitizer.  Do not smoke. Avoid being around people who are smoking (secondhand smoke).  Keep all follow-up visits as told by your health care provider. This is important. Contact a health care provider if:  You have a fever.  Your symptoms get worse.  Your symptoms do not improve within 10 days. Get help right away if:  You have a severe headache.  You have persistent vomiting.  You have pain or swelling around your face or eyes.  You have vision problems.  You develop confusion.  Your neck is stiff.  You have trouble breathing. This information is not intended to replace advice given to you by your health care provider.  Make sure you discuss any questions you have with your health care provider. Document Released: 03/15/2005 Document Revised: 11/09/2015 Document Reviewed: 01/08/2015 Elsevier Interactive Patient Education  2017 [employer].

## 2016-02-13 NOTE — Progress Notes (Signed)
Subjective:    Patient ID: Ruben West, male    DOB: 1955/08/28, 60 y.o.   MRN: [id number]  02/13/2016  Sinusitis (Onset 1 week) and Cough (Also a week to 10 days)   HPI This 60 y.o. male presents for evaluation of sinusitis and cough.  Onset ten days ago.  No fever; +chills/sweats.  Head congestion; +sinus pressure.  Eyes are draining.  +Headache; +teeth pain.  Chronic issues with sinuses.  R ear pain with blowing nose.  +drainage in R ear; no bloody drainage.  +nasal congestion; green dark bright green.  +coughing with PND.  Small amount of upper chest congestion.  No SOB.  No v/d.  Alkeseltzer Cold Plus.  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   Review of Systems  Constitutional: Negative for activity change, appetite change, chills, diaphoresis, fatigue and fever.  HENT: Positive for congestion, postnasal drip and rhinorrhea. Negative for ear pain, sore throat, trouble swallowing and voice change.   Eyes: Negative for visual disturbance.  Respiratory: Positive for cough. Negative for shortness of breath and wheezing.   Cardiovascular: Negative for chest pain, palpitations and leg swelling.  Endocrine: Negative for cold intolerance, heat intolerance, polydipsia, polyphagia and polyuria.  Neurological: Negative for dizziness, tremors, seizures, syncope, facial asymmetry, speech difficulty, weakness, light-headedness, numbness and headaches.    Past Medical History:  Diagnosis Date  . ALLERGIC RHINITIS 11/23/2006  . Back pain   . 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 FATTY LIVER DISEASE 05/09/2008  . GERD 04/04/2007   no per pt  . Heart murmur   . Hypercholesteremia   . HYPERGLYCEMIA 04/04/2007  . HYPERTENSION 11/23/2006  . Migraines   . NASH (nonalcoholic steatohepatitis)   . 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 PUD (peptic ulcer disease)    Past Surgical History:  Procedure Laterality Date  . COLONOSCOPY    . ELECTROCARDIOGRAM  02/21/2006  . VASECTOMY     No Known  Allergies  Social History   Social History  . Marital status: Married    Spouse name: N/A  . Number of children: N/A  . Years of education: N/A   Occupational History  . [employer]cker [employer]   Social History Main Topics  . Smoking status: Never Smoker  . Smokeless tobacco: Never Used  . Alcohol use 0.0 oz/week     Comment: Rarely  . Drug use: No  . Sexual activity: Not on file   Other Topics Concern  . Not on file   Social History Narrative   Lives with wife in a one story home.  Has 2 grandchildren living with him.     Works as a Administrator.     Education: 10th grade.        Family History  Problem Relation Age of Onset  . Heart attack Mother     MI, deceased 51  . Cancer Neg Hx     no cancer in immediate family  . Colon cancer Neg Hx   . Esophageal cancer Neg Hx   . Rectal cancer Neg Hx   . Stomach cancer Neg Hx   . Emphysema Father     Deceased, 43  . Healthy Daughter     x2  . Seizures Grandchild   . Seizures Mother        Objective:    There were no vitals taken for this visit. Physical Exam  Constitutional: He is oriented to person, place, and time. He appears well-developed and well-nourished. No distress.  HENT:  Head: Normocephalic and atraumatic.  Right Ear: External ear normal.  Left Ear: External ear normal.  Nose: Right sinus exhibits maxillary sinus tenderness. Right sinus exhibits no frontal sinus tenderness. Left sinus exhibits no frontal sinus tenderness.  Mouth/Throat: Oropharynx is clear and moist.  Eyes: Conjunctivae and EOM are normal. Pupils are equal, round, and reactive to light.  Neck: Normal range of motion. Neck supple. Carotid bruit is not present. No thyromegaly present.  Cardiovascular: Normal rate, regular rhythm, normal heart sounds and intact distal pulses.  Exam reveals no gallop and no friction rub.   No murmur heard. Pulmonary/Chest: Effort normal and breath sounds normal. He has no wheezes. He has no rales.   Abdominal: Soft. Bowel sounds are normal. He exhibits no distension and no mass. There is no tenderness. There is no rebound and no guarding.  Lymphadenopathy:    He has no cervical adenopathy.  Neurological: He is alert and oriented to person, place, and time. No cranial nerve deficit.  Skin: Skin is warm and dry. No rash noted. He is not diaphoretic.  Psychiatric: He has a normal mood and affect. His behavior is normal.  Nursing note and vitals reviewed.       Assessment & Plan:   1. Acute recurrent maxillary sinusitis   2. Acute seasonal allergic rhinitis due to pollen   3. Need for prophylactic vaccination and inoculation against influenza    -New/recurrent. -rx for Amoxicillin and Flonase. -add nasal saline spray bid. -stop Afrin.   Orders Placed This Encounter  Procedures  . Flu Vaccine QUAD 36+ mos IM   Meds ordered this encounter  Medications  . amoxicillin (AMOXIL) 500 MG tablet    Sig: Take 2 tablets (1,000 mg total) by mouth 2 (two) times daily.    Dispense:  40 tablet    Refill:  0  . fluticasone (FLONASE) 50 MCG/ACT nasal spray    Sig: Place 2 sprays into both nostrils daily.    Dispense:  16 g    Refill:  6    No Follow-up on file.   Eustacia Urbanek Elayne Guerin, M.D. [facility] [address] [place], [place]  [address] [phone] phone [phone] fax

## 2016-02-27 ENCOUNTER — Ambulatory Visit: Payer: Self-pay | Admitting: Family Medicine

## 2016-03-23 ENCOUNTER — Other Ambulatory Visit: Payer: Self-pay | Admitting: Endocrinology

## 2016-04-27 ENCOUNTER — Other Ambulatory Visit: Payer: Self-pay | Admitting: Endocrinology

## 2016-04-29 ENCOUNTER — Other Ambulatory Visit: Payer: Self-pay | Admitting: Endocrinology

## 2016-04-30 ENCOUNTER — Encounter: Payer: Self-pay | Admitting: Endocrinology

## 2016-04-30 ENCOUNTER — Ambulatory Visit (INDEPENDENT_AMBULATORY_CARE_PROVIDER_SITE_OTHER): Payer: Managed Care, Other (non HMO) | Admitting: Endocrinology

## 2016-04-30 DIAGNOSIS — R42 Dizziness and giddiness: Secondary | ICD-10-CM | POA: Insufficient documentation

## 2016-04-30 MED ORDER — PROMETHAZINE-CODEINE 6.25-10 MG/5ML PO SYRP: 5.0000 mL | ORAL_SOLUTION | ORAL | 0 refills | Status: DC | PRN

## 2016-04-30 MED ORDER — AZITHROMYCIN 500 MG PO TABS: 500.0000 mg | ORAL_TABLET | Freq: Every day | ORAL | 0 refills | Status: DC

## 2016-04-30 NOTE — Progress Notes (Signed)
Subjective:    Patient ID: Ruben West, male    DOB: [birth year], 61 y.o.   MRN: [id number]  HPI pt states 1 week of vertigenous-quality dizziness in the head, and assoc nasal congestion.  The vertigo has been recurrent x 1 year.  He saw UC last week, and was rx'ed amoxil.   Past Medical History:  Diagnosis Date  . ALLERGIC RHINITIS 11/23/2006  . Back pain   . 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 FATTY LIVER DISEASE 05/09/2008  . GERD 04/04/2007   no per pt  . Heart murmur   . Hypercholesteremia   . HYPERGLYCEMIA 04/04/2007  . HYPERTENSION 11/23/2006  . Migraines   . NASH (nonalcoholic steatohepatitis)   . 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 PUD (peptic ulcer disease)     Past Surgical History:  Procedure Laterality Date  . COLONOSCOPY    . ELECTROCARDIOGRAM  02/21/2006  . VASECTOMY      Social History   Social History  . Marital status: Married    Spouse name: N/A  . Number of children: N/A  . Years of education: N/A   Occupational History  . [employer]   Social History Main Topics  . Smoking status: Never Smoker  . Smokeless tobacco: Never Used  . Alcohol use 0.0 oz/week     Comment: Rarely  . Drug use: No  . Sexual activity: Not on file   Other Topics Concern  . Not on file   Social History Narrative   Lives with wife in a one story home.  Has 2 grandchildren living with him.     Works as a Administrator.     Education: 10th grade.         Current Outpatient Prescriptions on File Prior to Visit  Medication Sig Dispense Refill  . aspirin EC 81 MG tablet Take 81 mg by mouth every morning.    . fluticasone (FLONASE) 50 MCG/ACT nasal spray Place 2 sprays into both nostrils daily. 16 g 6  . losartan-hydrochlorothiazide (HYZAAR) 50-12.5 MG tablet TAKE 1 TABLET BY MOUTH DAILY 90 tablet 2  . lovastatin (MEVACOR) 40 MG tablet TAKE 1 TABLET BY MOUTH AT BEDTIME 90 tablet 2  . SUMAtriptan (IMITREX) 100 MG tablet TAKE 1 TABLET BY MOUTH  EVERY 2 HOURS AS NEEDED FOR MIGRAINE *DO NOT EXCEED 2 PER DAY 10 tablet 0   No current facility-administered medications on file prior to visit.     No Known Allergies  Family History  Problem Relation Age of Onset  . Heart attack Mother     MI, deceased 49  . Cancer Neg Hx     no cancer in immediate family  . Colon cancer Neg Hx   . Esophageal cancer Neg Hx   . Rectal cancer Neg Hx   . Stomach cancer Neg Hx   . Emphysema Father     Deceased, 30  . Healthy Daughter     x2  . Seizures Grandchild   . Seizures Mother     BP 128/86   Pulse 76   Temp 97.7 F (36.5 C) (Oral)   Ht 5' 9.5" (1.765 m)   Wt 232 lb (105.2 kg)   SpO2 97%   BMI 33.77 kg/m    Review of Systems He has bilat earache and low-back pain.  He also has a dry cough.  Denies bowel or bladder retention.  No fever.      Objective:   Physical Exam VITAL SIGNS:  See vs page GENERAL: no distress head: no deformity  eyes: no periorbital swelling, no proptosis.   external nose and ears are normal  mouth: no lesion seen Both tm's are very red.   LUNGS:  Clear to auscultation Spine: nontender Gait: normal and steady.       Assessment & Plan:  URI: persistent Vertigo, recurrent Back pain, due to coughing.  Patient is advised the following: Patient Instructions  Please come back for a regular physical appointment (must be after march 14). Please see a neurology specialist.  you will receive a phone call, about a day and time for an appointment. I have sent a prescription to your pharmacy, for a different antibiotic.  Loratadine-d (non-prescription) will help your congestion.   Here is a prescription for cough syrup. This will help you back pain, also.   Saline nose spray helps, also.

## 2016-04-30 NOTE — Patient Instructions (Addendum)
Please come back for a regular physical appointment (must be after march 14). Please see a neurology specialist.  you will receive a phone call, about a day and time for an appointment. I have sent a prescription to your pharmacy, for a different antibiotic.  Loratadine-d (non-prescription) will help your congestion.   Here is a prescription for cough syrup. This will help you back pain, also.   Saline nose spray helps, also.

## 2016-05-18 ENCOUNTER — Ambulatory Visit (INDEPENDENT_AMBULATORY_CARE_PROVIDER_SITE_OTHER): Payer: Managed Care, Other (non HMO) | Admitting: Family Medicine

## 2016-05-18 ENCOUNTER — Ambulatory Visit (HOSPITAL_COMMUNITY)
Admission: RE | Admit: 2016-05-18 | Discharge: 2016-05-18 | Disposition: A | Discharge status: Home / Self Care | Payer: Managed Care, Other (non HMO) | Source: Ambulatory Visit | Attending: Family Medicine | Admitting: Family Medicine

## 2016-05-18 VITALS — BP 110/78 | HR 70 | Temp 98.3°F | Resp 16 | Ht 69.5 in | Wt 232.0 lb

## 2016-05-18 DIAGNOSIS — H532 Diplopia: Secondary | ICD-10-CM

## 2016-05-18 DIAGNOSIS — J309 Allergic rhinitis, unspecified: Secondary | ICD-10-CM

## 2016-05-18 DIAGNOSIS — R51 Headache: Secondary | ICD-10-CM | POA: Insufficient documentation

## 2016-05-18 DIAGNOSIS — J3489 Other specified disorders of nose and nasal sinuses: Secondary | ICD-10-CM | POA: Diagnosis present

## 2016-05-18 DIAGNOSIS — R42 Dizziness and giddiness: Secondary | ICD-10-CM | POA: Insufficient documentation

## 2016-05-18 DIAGNOSIS — R519 Headache, unspecified: Secondary | ICD-10-CM

## 2016-05-18 NOTE — Patient Instructions (Addendum)
Start saline nasal spray 4-5 times per day to help with nasal congestion. Continue Flonase each day, and Claritin-D may also be beneficial for congestion as recommended by your primary care provider. I will check the CT scan today or tomorrow to look further into the congestion and dizziness, but if you do have any return of double vision, would recommend evaluation in ER as other imaging or testing may be needed. If any weakness, slurred speech or facial weakness, call 911.   Take meclizine up to every 8 hours, and do not drive until vertigo has resolved. Follow up with your primary care provider to discuss vertigo and ringing in the ears further as you may need to meet with an ear nose and throat specialist, or other neuroimaging. Call his office tomorrow to advise him of your symptoms and to see if office visit needed.   Return to the clinic or go to the nearest emergency room if any of your symptoms worsen or new symptoms occur.  GO TO [place] [facility] TODAY  [address]    Dizziness Dizziness is a common problem. It is a feeling of unsteadiness or light-headedness. You may feel like you are about to faint. Dizziness can lead to injury if you stumble or fall. Anyone can become dizzy, but dizziness is more common in older adults. This condition can be caused by a number of things, including medicines, dehydration, or illness. Follow these instructions at home: Taking these steps may help with your condition: Eating and drinking  Drink enough fluid to keep your urine clear or pale yellow. This helps to keep you from becoming dehydrated. Try to drink more clear fluids, such as water.  Do not drink alcohol.  Limit your caffeine intake if directed by your health care provider.  Limit your salt intake if directed by your health care provider. Activity  Avoid making quick movements.  Rise slowly from chairs and steady yourself until you feel okay.  In the morning, first sit up on  the side of the bed. When you feel okay, stand slowly while you hold onto something until you know that your balance is fine.  Move your legs often if you need to stand in one place for a long time. Tighten and relax your muscles in your legs while you are standing.  Do not drive or operate heavy machinery if you feel dizzy.  Avoid bending down if you feel dizzy. Place items in your home so that they are easy for you to reach without leaning over. Lifestyle  Do not use any tobacco products, including cigarettes, chewing tobacco, or electronic cigarettes. If you need help quitting, ask your health care provider.  Try to reduce your stress level, such as with yoga or meditation. Talk with your health care provider if you need help. General instructions  Watch your dizziness for any changes.  Take medicines only as directed by your health care provider. Talk with your health care provider if you think that your dizziness is caused by a medicine that you are taking.  Tell a friend or a family member that you are feeling dizzy. If he or she notices any changes in your behavior, have this person call your health care provider.  Keep all follow-up visits as directed by your health care provider. This is important. Contact a health care provider if:  Your dizziness does not go away.  Your dizziness or light-headedness gets worse.  You feel nauseous.  You have reduced hearing.  You have new symptoms.  You are unsteady on your feet or you feel like the room is spinning. Get help right away if:  You vomit or have diarrhea and are unable to eat or drink anything.  You have problems talking, walking, swallowing, or using your arms, hands, or legs.  You feel generally weak.  You are not thinking clearly or you have trouble forming sentences. It may take a friend or family member to notice this.  You have chest pain, abdominal pain, shortness of breath, or sweating.  Your vision  changes.  You notice any bleeding.  You have a headache.  You have neck pain or a stiff neck.  You have a fever. This information is not intended to replace advice given to you by your health care provider. Make sure you discuss any questions you have with your health care provider. Document Released: 09/08/2000 Document Revised: 08/21/2015 Document Reviewed: 03/11/2014 Elsevier Interactive Patient Education  2017 [employer].   IF you received an x-ray today, you will receive an invoice from [facility]. Please contact [facility] at [phone] with questions or concerns regarding your invoice.   IF you received labwork today, you will receive an invoice from [facility]. Please contact [facility] at [phone] with questions or concerns regarding your invoice.   Our billing staff will not be able to assist you with questions regarding bills from these companies.  You will be contacted with the lab results as soon as they are available. The fastest way to get your results is to activate your My Chart account. Instructions are located on the last page of this paperwork. If you have not heard from [name] regarding the results in 2 weeks, please contact this office.

## 2016-05-18 NOTE — Progress Notes (Addendum)
By signing my name below, I, Mesha Guinyard, attest that this documentation has been prepared under the direction and in the presence of Merri Ray, MD.  Electronically Signed: Verlee Monte, Medical Scribe. 05/18/16. 4:50 PM.  Subjective:    Patient ID: Ruben West, male    DOB: 05-29-1955, 61 y.o.   MRN: [id number]  HPI Chief Complaint  Patient presents with  . Dizziness    w/nausea  . Headache  . Sinus Problem    HPI Comments: Ruben West is a 61 y.o. male with a PMHx of vertigo, HTN, allergic rhinitis, migraine, and HLD who presents to the [facility] complaining of worsening dizziness for the past 2 weeks. He was seen 18 days ago by his PCP with sxs of vertigo, associated nasal congestion at that time but vertigo sxs were present for 1 year. He had been treated for sinusitis on Nov 17th with amoxicillin and flonase. He was referred to neurology for the recurrent vertigo, azithromycin was rx for URI, loratadine-D for congestion, phenergan with codeine cough syrup. Appt scheduled April 20th with Dr. Thresa Ross Neurology. Previous notes were reviewed.  Dizziness: Reports worsening persistent dizziness with associated sxs of nausea  for the past 2 weeks. Describes his dizziness similar to the room as spinning and is triggered with raising out the bed, and head movement. He suspects his HAs induce his dizziness when moving his head. Reports associated sxs of tinnitus for "a long time", and denies loss of hearing but reports he has make the TV louder to hear. 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 Pt has had vertigo exercises rx here and never went to vestibular rehab that was referred to him since he believed he didn't need it due to his sxs resolving at that time. Denies chest pain, chest tightness, and palpitations.  Congestion:. Reports congestion (sinus,  head, and nasal), occasional ear drainage, and occipital lobe HA for the past 2 weeks. He wakes up feeling like there is liquid dripping from his ears and/or it feels like something is crawling out of his ears when he wakes up. He also notes his congestion reproduces thick/chunky sputum. Took azithromycin that was rx to him 04/30/16 by [facility], flonase BID (in the morning and at night), and he was taking claritin-D but he ran out yesterday. Pt has had recurrent sinus infection in the past that was told it was from large sinuses when checked out at ENT. Denies fever.  Patient Active Problem List   Diagnosis Date Noted  . Vertigo 04/30/2016  . Degenerative arthritis of knee, bilateral 01/01/2016  . Benign paroxysmal positional vertigo 12/10/2015  . Wellness examination 06/10/2015  . Screening for prostate cancer 06/10/2015  . Allergic rhinitis 06/10/2015  . Skin nodule 06/10/2015  . Screening examination for infectious disease 03/04/2014  . Obesity (BMI 30-39.9) 01/13/2014  . Numbness 01/12/2014  . Migraine with aura 01/12/2014  . Atypical chest pain 01/12/2014  . Hyperlipidemia 01/12/2014  . Migraine, unspecified, without mention of intractable migraine without mention of status migrainosus 06/30/2012  . Depressive disorder, not elsewhere classified 05/22/2012  . Chronic meniscal tear of knee 12/22/2011  . FATTY LIVER DISEASE 05/09/2008  . OTHER TESTICULAR HYPOFUNCTION 05/19/2007  . GERD 04/04/2007  . 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 HYPERGLYCEMIA 04/04/2007  . Essential hypertension 11/23/2006  . ALLERGIC RHINITIS 11/23/2006   Past  Medical History:  Diagnosis Date  . ALLERGIC RHINITIS 11/23/2006  . Back pain   . 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 FATTY LIVER DISEASE 05/09/2008  . GERD 04/04/2007   no per pt  . Heart murmur   . Hypercholesteremia   . HYPERGLYCEMIA 04/04/2007  . HYPERTENSION 11/23/2006  . Migraines   . NASH (nonalcoholic steatohepatitis)   .  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 PUD (peptic ulcer disease)    Past Surgical History:  Procedure Laterality Date  . COLONOSCOPY    . ELECTROCARDIOGRAM  02/21/2006  . VASECTOMY     No Known Allergies Prior to Admission medications   Medication Sig Start Date End Date Taking? Authorizing Provider  aspirin EC 81 MG tablet Take 81 mg by mouth every morning.   Yes Historical Provider, MD  fluticasone (FLONASE) 50 MCG/ACT nasal spray Place 2 sprays into both nostrils daily. 02/13/16  Yes Wardell Honour, MD  losartan-hydrochlorothiazide (HYZAAR) 50-12.5 MG tablet TAKE 1 TABLET BY MOUTH DAILY 04/27/16  Yes Renato Shin, MD  lovastatin (MEVACOR) 40 MG tablet TAKE 1 TABLET BY MOUTH AT BEDTIME 05/19/15  Yes Renato Shin, MD  SUMAtriptan (IMITREX) 100 MG tablet TAKE 1 TABLET BY MOUTH EVERY 2 HOURS AS NEEDED FOR MIGRAINE *DO NOT EXCEED 2 PER DAY 04/29/16  Yes Renato Shin, MD  promethazine-codeine (PHENERGAN WITH CODEINE) 6.25-10 MG/5ML syrup Take 5 mLs by mouth every 4 (four) hours as needed. Patient not taking: Reported on 05/18/2016 04/30/16   Renato Shin, MD   Social History   Social History  . Marital status: Married    Spouse name: N/A  . Number of children: N/A  . Years of education: N/A   Occupational History  . Trucker [employer]   Social History Main Topics  . Smoking status: Never Smoker  . Smokeless tobacco: Never Used  . Alcohol use 0.0 oz/week     Comment: Rarely  . Drug use: No  . Sexual activity: Not on file   Other Topics Concern  . Not on file   Social History Narrative   Lives with wife in a one story home.  Has 2 grandchildren living with him.     Works as a Administrator.     Education: 10th grade.        Review of Systems  Constitutional: Negative for fever.  HENT: Positive for congestion, ear discharge, sinus pain and tinnitus. Negative for hearing loss.   Respiratory: Negative for chest tightness.   Cardiovascular: Negative for chest pain and  palpitations.  Neurological: Positive for dizziness and headaches.   Objective:  Physical Exam  Constitutional: He is oriented to person, place, and time. He appears well-developed and well-nourished. No distress.  HENT:  Head: Normocephalic and atraumatic.  Right Ear: Tympanic membrane, external ear and ear canal normal.  Left Ear: Tympanic membrane, external ear and ear canal normal.  Nose: No rhinorrhea. Right sinus exhibits frontal sinus tenderness (minimal). Right sinus exhibits no maxillary sinus tenderness. Left sinus exhibits frontal sinus tenderness (minimal). Left sinus exhibits no maxillary sinus tenderness.  Mouth/Throat: Oropharynx is clear and moist and mucous membranes are normal. No oropharyngeal exudate or posterior oropharyngeal erythema.  No fluid in the ears Canal were patent  No erythema in canal No edema in canal  Eyes: Conjunctivae are normal. Pupils are equal, round, and reactive to light. Right eye exhibits nystagmus. Left eye exhibits nystagmus.  2 - 3 beats of horizontal nystagmus  Neck: Neck supple.  Cardiovascular: Normal  rate, regular rhythm, normal heart sounds and intact distal pulses.   No murmur heard. Pulmonary/Chest: Effort normal and breath sounds normal. He has no wheezes. He has no rhonchi. He has no rales.  Abdominal: Soft. There is no tenderness.  Lymphadenopathy:    He has no cervical adenopathy.  Neurological: He is alert and oriented to person, place, and time. He displays a negative Romberg sign.  No pronator drift Initially he thought he saw 2 fingers with leftward gaze but it reverted to 1 finger Reproducible sxs with laying head back and standing up a long with horizontal nystagmus Did have some double vision with looking left again when he was supine Unable to complete heel to toe test  Skin: Skin is warm and dry. No rash noted.  Psychiatric: He has a normal mood and affect. His behavior is normal.  Nursing note and vitals reviewed.     Vitals:   05/18/16 1617  BP: 110/78  Pulse: 70  Resp: 16  Temp: 98.3 F (36.8 C)  TempSrc: Oral  SpO2: 96%  Weight: 232 lb (105.2 kg)  Height: 5' 9.5" (1.765 m)  Body mass index is 33.77 kg/m. Assessment & Plan:  Over 40 minutes face to face care and coordination of care.   Ruben West is a 62 y.o. male Nonintractable episodic headache, unspecified headache type - Plan: CT Head Wo Contrast Vertigo - Plan: CT Head Wo Contrast Sinus pressure - Plan: CT Head Wo Contrast Diplopia - Plan: CT Head Wo Contrast Acute allergic rhinitis, unspecified seasonality, unspecified trigger - Plan: CT Head Wo Contrast  - Possible multiple causes of above symptoms including recurrent benign positional vertigo, and sinus congestion/allergic rhinitis with potential chronic sinusitis. Nonfocal neurologic exam, able to reproduce symptoms with head movement indicating likely peripheral vertigo. Reportedly had episode of diplopia with looking one direction, but that was not consistent with repeat testing.  -CT head was obtained without acute findings.  -Continue decongestant, antihistamine for allergy symptoms, and meclizine for vertigo.   -follow up with primary care provider to discuss vertigo and tinnitus as ENT eval may be needed or further imaging.   -if any recurrence of diplopia, recommended immediate eval as may need MRI to look at posterior circulation, but also to keep follow up appt with neuro.   No orders of the defined types were placed in this encounter.  Patient Instructions   Start saline nasal spray 4-5 times per day to help with nasal congestion. Continue Flonase each day, and Claritin-D may also be beneficial for congestion as recommended by your primary care provider. I will check the CT scan today or tomorrow to look further into the congestion and dizziness, but if you do have any return of double vision, would recommend evaluation in ER as other imaging or testing may be needed. If  any weakness, slurred speech or facial weakness, call 911.   Take meclizine up to every 8 hours, and do not drive until vertigo has resolved. Follow up with your primary care provider to discuss vertigo and ringing in the ears further as you may need to meet with an ear nose and throat specialist, or other neuroimaging. Call his office tomorrow to advise him of your symptoms and to see if office visit needed.   Return to the clinic or go to the nearest emergency room if any of your symptoms worsen or new symptoms occur.  GO TO [place] [address] TODAY  [address]    Dizziness Dizziness  is a common problem. It is a feeling of unsteadiness or light-headedness. You may feel like you are about to faint. Dizziness can lead to injury if you stumble or fall. Anyone can become dizzy, but dizziness is more common in older adults. This condition can be caused by a number of things, including medicines, dehydration, or illness. Follow these instructions at home: Taking these steps may help with your condition: Eating and drinking  Drink enough fluid to keep your urine clear or pale yellow. This helps to keep you from becoming dehydrated. Try to drink more clear fluids, such as water.  Do not drink alcohol.  Limit your caffeine intake if directed by your health care provider.  Limit your salt intake if directed by your health care provider. Activity  Avoid making quick movements.  Rise slowly from chairs and steady yourself until you feel okay.  In the morning, first sit up on the side of the bed. When you feel okay, stand slowly while you hold onto something until you know that your balance is fine.  Move your legs often if you need to stand in one place for a long time. Tighten and relax your muscles in your legs while you are standing.  Do not drive or operate heavy machinery if you feel dizzy.  Avoid bending down if you feel dizzy. Place items in your home so that they are easy for  you to reach without leaning over. Lifestyle  Do not use any tobacco products, including cigarettes, chewing tobacco, or electronic cigarettes. If you need help quitting, ask your health care provider.  Try to reduce your stress level, such as with yoga or meditation. Talk with your health care provider if you need help. General instructions  Watch your dizziness for any changes.  Take medicines only as directed by your health care provider. Talk with your health care provider if you think that your dizziness is caused by a medicine that you are taking.  Tell a friend or a family member that you are feeling dizzy. If he or she notices any changes in your behavior, have this person call your health care provider.  Keep all follow-up visits as directed by your health care provider. This is important. Contact a health care provider if:  Your dizziness does not go away.  Your dizziness or light-headedness gets worse.  You feel nauseous.  You have reduced hearing.  You have new symptoms.  You are unsteady on your feet or you feel like the room is spinning. Get help right away if:  You vomit or have diarrhea and are unable to eat or drink anything.  You have problems talking, walking, swallowing, or using your arms, hands, or legs.  You feel generally weak.  You are not thinking clearly or you have trouble forming sentences. It may take a friend or family member to notice this.  You have chest pain, abdominal pain, shortness of breath, or sweating.  Your vision changes.  You notice any bleeding.  You have a headache.  You have neck pain or a stiff neck.  You have a fever. This information is not intended to replace advice given to you by your health care provider. Make sure you discuss any questions you have with your health care provider. Document Released: 09/08/2000 Document Revised: 08/21/2015 Document Reviewed: 03/11/2014 Elsevier Interactive Patient Education  2017  [employer].   IF you received an x-ray today, you will receive an invoice from [facility]. Please contact [facility] at [phone] with questions or concerns regarding your invoice.   IF you received labwork today, you will receive an invoice from [facility]. Please contact [facility] at [phone] with questions or concerns regarding your invoice.   Our billing staff will not be able to assist you with questions regarding bills from these companies.  You will be contacted with the lab results as soon as they are available. The fastest way to get your results is to activate your My Chart account. Instructions are located on the last page of this paperwork. If you have not heard from [name] regarding the results in 2 weeks, please contact this office.      I personally performed the services described in this documentation, which was scribed in my presence. The recorded information has been reviewed and considered for accuracy and completeness, addended by me as needed, and agree with information above.  Signed,   Merri Ray, MD [facility].  05/21/16 11:54 AM

## 2016-05-20 ENCOUNTER — Other Ambulatory Visit: Payer: Self-pay | Admitting: Endocrinology

## 2016-06-14 ENCOUNTER — Ambulatory Visit (INDEPENDENT_AMBULATORY_CARE_PROVIDER_SITE_OTHER): Payer: Managed Care, Other (non HMO) | Admitting: Endocrinology

## 2016-06-14 ENCOUNTER — Encounter: Payer: Self-pay | Admitting: Endocrinology

## 2016-06-14 VITALS — BP 132/84 | HR 76 | Ht 69.5 in | Wt 236.0 lb

## 2016-06-14 DIAGNOSIS — Z0000 Encounter for general adult medical examination without abnormal findings: Secondary | ICD-10-CM

## 2016-06-14 DIAGNOSIS — Z125 Encounter for screening for malignant neoplasm of prostate: Secondary | ICD-10-CM | POA: Diagnosis not present

## 2016-06-14 DIAGNOSIS — R7309 Other abnormal glucose: Secondary | ICD-10-CM

## 2016-06-14 LAB — URINALYSIS, ROUTINE W REFLEX MICROSCOPIC
BILIRUBIN URINE: NEGATIVE
HGB URINE DIPSTICK: NEGATIVE
KETONES UR: NEGATIVE
LEUKOCYTES UA: NEGATIVE
Nitrite: NEGATIVE
RBC / HPF: NONE SEEN (ref 0–?)
Specific Gravity, Urine: 1.01 (ref 1.000–1.030)
TOTAL PROTEIN, URINE-UPE24: NEGATIVE
URINE GLUCOSE: NEGATIVE
UROBILINOGEN UA: 0.2 (ref 0.0–1.0)
WBC UA: NONE SEEN (ref 0–?)
pH: 5.5 (ref 5.0–8.0)

## 2016-06-14 LAB — LIPID PANEL
CHOLESTEROL: 133 mg/dL (ref 0–200)
HDL: 44.6 mg/dL (ref >39.00)
LDL Cholesterol: 61 mg/dL (ref 0–99)
NonHDL: 88.61
Total CHOL/HDL Ratio: 3
Triglycerides: 139 mg/dL (ref 0.0–149.0)
VLDL: 27.8 mg/dL (ref 0.0–40.0)

## 2016-06-14 LAB — TSH: TSH: 3.4 u[IU]/mL (ref 0.35–4.50)

## 2016-06-14 LAB — CBC WITH DIFFERENTIAL/PLATELET
BASOS ABS: 0.1 10*3/uL (ref 0.0–0.1)
BASOS PCT: 1.1 % (ref 0.0–3.0)
Eosinophils Absolute: 0.1 10*3/uL (ref 0.0–0.7)
Eosinophils Relative: 1.9 % (ref 0.0–5.0)
HEMATOCRIT: 46.8 % (ref 39.0–52.0)
HEMOGLOBIN: 16.1 g/dL (ref 13.0–17.0)
LYMPHS PCT: 24.1 % (ref 12.0–46.0)
Lymphs Abs: 1.8 10*3/uL (ref 0.7–4.0)
MCHC: 34.5 g/dL (ref 30.0–36.0)
MCV: 87.8 fl (ref 78.0–100.0)
Monocytes Absolute: 0.9 10*3/uL (ref 0.1–1.0)
Monocytes Relative: 11.9 % (ref 3.0–12.0)
NEUTROS ABS: 4.5 10*3/uL (ref 1.4–7.7)
Neutrophils Relative %: 61 % (ref 43.0–77.0)
PLATELETS: 190 10*3/uL (ref 150.0–400.0)
RBC: 5.33 Mil/uL (ref 4.22–5.81)
RDW: 12.8 % (ref 11.5–15.5)
WBC: 7.4 10*3/uL (ref 4.0–10.5)

## 2016-06-14 LAB — HEMOGLOBIN A1C: HEMOGLOBIN A1C: 5.1 % (ref 4.6–6.5)

## 2016-06-14 LAB — BASIC METABOLIC PANEL
BUN: 15 mg/dL (ref 6–23)
CHLORIDE: 103 meq/L (ref 96–112)
CO2: 30 meq/L (ref 19–32)
Calcium: 9.4 mg/dL (ref 8.4–10.5)
Creatinine, Ser: 0.99 mg/dL (ref 0.40–1.50)
GFR: 81.74 mL/min (ref >60.00)
Glucose, Bld: 106 mg/dL — ABNORMAL HIGH (ref 70–99)
POTASSIUM: 4 meq/L (ref 3.5–5.1)
Sodium: 142 mEq/L (ref 135–145)

## 2016-06-14 LAB — HEPATIC FUNCTION PANEL
ALT: 36 U/L (ref 0–53)
AST: 23 U/L (ref 0–37)
Albumin: 4.1 g/dL (ref 3.5–5.2)
Alkaline Phosphatase: 73 U/L (ref 39–117)
BILIRUBIN DIRECT: 0.1 mg/dL (ref 0.0–0.3)
TOTAL PROTEIN: 6.4 g/dL (ref 6.0–8.3)
Total Bilirubin: 0.7 mg/dL (ref 0.2–1.2)

## 2016-06-14 LAB — PSA: PSA: 0.39 ng/mL (ref 0.10–4.00)

## 2016-06-14 MED ORDER — FLUCONAZOLE 150 MG PO TABS: 150.0000 mg | ORAL_TABLET | Freq: Every day | ORAL | 3 refills | Status: DC

## 2016-06-14 MED ORDER — CLOTRIMAZOLE-BETAMETHASONE 1-0.05 % EX CREA: 1.0000 "application " | TOPICAL_CREAM | Freq: Three times a day (TID) | CUTANEOUS | 2 refills | Status: DC | PRN

## 2016-06-14 NOTE — Patient Instructions (Addendum)
Please consider these measures for your health:  minimize alcohol.  Do not use tobacco products.  Have a colonoscopy at least every 10 years from age 61.  Keep firearms safely stored.  Always use seat belts.  have working smoke alarms in your home.  See an eye doctor and dentist regularly.  Never drive under the influence of alcohol or drugs (including prescription drugs).  Those with fair skin should take precautions against the sun, and should carefully examine their skin once per month, for any new or changed moles. I have sent prescriptions to your pharmacy, for a skin cream and a pill for the rash.  Due to an interaction, skip the lovastatin when you take the pill.   blood and urine tests are requested for you today.  We'll let you know about the results. Please return in 1 year.

## 2016-06-14 NOTE — Progress Notes (Signed)
Subjective:    Patient ID: Ruben West, male    DOB: 10-Apr-1955, 61 y.o.   MRN: [id number]  HPI Pt is here for regular wellness examination, and is feeling pretty well in general, and says chronic med probs are stable, except as noted below. Past Medical History:  Diagnosis Date  . ALLERGIC RHINITIS 11/23/2006  . Back pain   . 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 FATTY LIVER DISEASE 05/09/2008  . GERD 04/04/2007   no per pt  . Heart murmur   . Hypercholesteremia   . HYPERGLYCEMIA 04/04/2007  . HYPERTENSION 11/23/2006  . Migraines   . NASH (nonalcoholic steatohepatitis)   . 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 PUD (peptic ulcer disease)     Past Surgical History:  Procedure Laterality Date  . COLONOSCOPY    . ELECTROCARDIOGRAM  02/21/2006  . VASECTOMY      Social History   Social History  . Marital status: Married    Spouse name: N/A  . Number of children: N/A  . Years of education: N/A   Occupational History  . Trucker [employer]   Social History Main Topics  . Smoking status: Never Smoker  . Smokeless tobacco: Never Used  . Alcohol use 0.0 oz/week     Comment: Rarely  . Drug use: No  . Sexual activity: Not on file   Other Topics Concern  . Not on file   Social History Narrative   Lives with wife in a one story home.  Has 2 grandchildren living with him.     Works as a Administrator.     Education: 10th grade.         Current Outpatient Prescriptions on File Prior to Visit  Medication Sig Dispense Refill  . aspirin EC 81 MG tablet Take 81 mg by mouth every morning.    . fluticasone (FLONASE) 50 MCG/ACT nasal spray Place 2 sprays into both nostrils daily. 16 g 6  . losartan-hydrochlorothiazide (HYZAAR) 50-12.5 MG tablet TAKE 1 TABLET BY MOUTH DAILY 90 tablet 2  . lovastatin (MEVACOR) 40 MG tablet TAKE 1 TABLET BY MOUTH AT BEDTIME 90 tablet 2  . SUMAtriptan (IMITREX) 100 MG tablet TAKE 1 TABLET BY MOUTH EVERY 2 HOURS AS NEEDED FOR MIGRAINE  *DO NOT EXCEED 2 PER DAY 10 tablet 0   No current facility-administered medications on file prior to visit.     No Known Allergies  Family History  Problem Relation Age of Onset  . Heart attack Mother     MI, deceased 53  . Cancer Neg Hx     no cancer in immediate family  . Colon cancer Neg Hx   . Esophageal cancer Neg Hx   . Rectal cancer Neg Hx   . Stomach cancer Neg Hx   . Emphysema Father     Deceased, 60  . Healthy Daughter     x2  . Seizures Grandchild   . Seizures Mother     BP 132/84   Pulse 76   Ht 5' 9.5" (1.765 m)   Wt 236 lb (107 kg)   SpO2 98%   BMI 34.35 kg/m   Review of Systems Constitutional:  Negative for weight change.  HENT: Negative for hearing loss.   Eyes: Negative for visual disturbance.  Respiratory: Negative for shortness of breath.   Cardiovascular: Negative for chest pain.  Endocrine: Negative for cold intolerance.  Genitourinary: Negative for hematuria.  Musculoskeletal: Negative for back pain.  Skin: Negative for  wound.  Allergic/Immunologic: Positive for environmental allergies.  Neurological: Negative for syncope and numbness.  Hematological: Does not bruise/bleed easily.  Psychiatric/Behavioral: Negative for dysphoric mood.     Objective:   Physical Exam VS: see vs page GEN: no distress HEAD: head: no deformity eyes: no periorbital swelling, no proptosis external nose and ears are normal mouth: no lesion seen NECK: supple, thyroid is not enlarged CHEST WALL: no deformity LUNGS: clear to auscultation BREASTS:  No gynecomastia CV: reg rate and rhythm, no murmur ABD: abdomen is soft, nontender.  no hepatosplenomegaly.  not distended.  no hernia  RECTAL/PROSTATE: declined MUSCULOSKELETAL: muscle bulk and strength are grossly normal.  no obvious joint swelling.  gait is normal and steady EXTEMITIES: no deformity.  no ulcer on the feet.  feet are of normal color and temp.  no edema PULSES: dorsalis pedis intact bilat.  no  carotid bruit NEURO:  cn 2-12 grossly intact.   readily moves all 4's.  sensation is intact to touch on the feet SKIN:  Normal texture and temperature.  No rash is visible.  There is a few mm nodule at the right temporal area   NODES:  None palpable at the neck PSYCH: alert, well-oriented.  Does not appear anxious nor depressed.  I personally reviewed electrocardiogram tracing (today): Indication: wellness Impression: NSR.  QS complexes in and ant lat leads  No hypertrophy. Compared to 2017: no change     Assessment & Plan:  Wellness visit today, with problems stable, except as noted.   SEPARATE EVALUATION FOLLOWS--EACH PROBLEM HERE IS NEW, NOT RESPONDING TO TREATMENT, OR POSES SIGNIFICANT RISK TO THE PATIENT'S HEALTH: HISTORY OF THE PRESENT ILLNESS: Pt states few mos of moderate rash of the buttocks, and assoc itching.  PAST MEDICAL HISTORY Past Medical History:  Diagnosis Date  . ALLERGIC RHINITIS 11/23/2006  . Back pain   . 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 FATTY LIVER DISEASE 05/09/2008  . GERD 04/04/2007   no per pt  . Heart murmur   . Hypercholesteremia   . HYPERGLYCEMIA 04/04/2007  . HYPERTENSION 11/23/2006  . Migraines   . NASH (nonalcoholic steatohepatitis)   . 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 PUD (peptic ulcer disease)     Past Surgical History:  Procedure Laterality Date  . COLONOSCOPY    . ELECTROCARDIOGRAM  02/21/2006  . VASECTOMY      Social History   Social History  . Marital status: Married    Spouse name: N/A  . Number of children: N/A  . Years of education: N/A   Occupational History  . [employer]   Social History Main Topics  . Smoking status: Never Smoker  . Smokeless tobacco: Never Used  . Alcohol use 0.0 oz/week     Comment: Rarely  . Drug use: No  . Sexual activity: Not on file   Other Topics Concern  . Not on file   Social History Narrative   Lives with wife in a one story home.  Has 2 grandchildren  living with him.     Works as a Administrator.     Education: 10th grade.         Current Outpatient Prescriptions on File Prior to Visit  Medication Sig Dispense Refill  . aspirin EC 81 MG tablet Take 81 mg by mouth every morning.    . fluticasone (FLONASE) 50 MCG/ACT nasal spray Place 2 sprays into both nostrils daily. 16 g 6  . losartan-hydrochlorothiazide (HYZAAR) 50-12.5 MG tablet TAKE  1 TABLET BY MOUTH DAILY 90 tablet 2  . lovastatin (MEVACOR) 40 MG tablet TAKE 1 TABLET BY MOUTH AT BEDTIME 90 tablet 2  . SUMAtriptan (IMITREX) 100 MG tablet TAKE 1 TABLET BY MOUTH EVERY 2 HOURS AS NEEDED FOR MIGRAINE *DO NOT EXCEED 2 PER DAY 10 tablet 0   No current facility-administered medications on file prior to visit.     No Known Allergies  Family History  Problem Relation Age of Onset  . Heart attack Mother     MI, deceased 1  . Cancer Neg Hx     no cancer in immediate family  . Colon cancer Neg Hx   . Esophageal cancer Neg Hx   . Rectal cancer Neg Hx   . Stomach cancer Neg Hx   . Emphysema Father     Deceased, 67  . Healthy Daughter     x2  . Seizures Grandchild   . Seizures Mother     BP 132/84   Pulse 76   Ht 5' 9.5" (1.765 m)   Wt 236 lb (107 kg)   SpO2 98%   BMI 34.35 kg/m   REVIEW OF SYSTEMS: Denies fever and BRBPR PHYSICAL EXAMINATION: VITAL SIGNS:  See vs page GENERAL: no distress At the interbuttock skin fold, there is moderate erythema LAB/XRAY RESULTS: Lab Results  Component Value Date   WBC 7.4 06/14/2016   HGB 16.1 06/14/2016   HCT 46.8 06/14/2016   MCV 87.8 06/14/2016   PLT 190.0 06/14/2016   IMPRESSION: Cutaneous candiasis, new Dyslipidemia: there is a drug interaction between diflucan and mevacor PLAN:  I have sent prescriptions to your pharmacy, for a skin cream and a pill for the rash.  Due to an interaction, skip the lovastatin when you take the pill

## 2016-07-16 ENCOUNTER — Ambulatory Visit: Payer: Self-pay | Admitting: Neurology

## 2016-07-19 ENCOUNTER — Ambulatory Visit: Payer: Self-pay | Admitting: Neurology

## 2016-07-24 ENCOUNTER — Ambulatory Visit: Payer: Managed Care, Other (non HMO)

## 2016-07-26 ENCOUNTER — Ambulatory Visit: Payer: Managed Care, Other (non HMO) | Admitting: Family Medicine

## 2016-07-28 ENCOUNTER — Telehealth: Payer: Self-pay | Admitting: *Deleted

## 2016-07-28 NOTE — Telephone Encounter (Signed)
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 Patient also would like a new refill he states the pharmacy called and told him. Please advise  [phone]

## 2016-07-28 NOTE — Telephone Encounter (Signed)
Ok to miss for a few days.

## 2016-08-02 ENCOUNTER — Other Ambulatory Visit: Payer: Self-pay

## 2016-08-02 MED ORDER — LOVASTATIN 40 MG PO TABS: 40.0000 mg | ORAL_TABLET | Freq: Every day | ORAL | 1 refills | Status: DC

## 2016-08-02 NOTE — Telephone Encounter (Signed)
Patient is aware 

## 2016-08-10 ENCOUNTER — Other Ambulatory Visit: Payer: Self-pay

## 2016-08-10 ENCOUNTER — Telehealth: Payer: Self-pay | Admitting: Endocrinology

## 2016-08-10 MED ORDER — LOSARTAN POTASSIUM-HCTZ 50-12.5 MG PO TABS: 1.0000 | ORAL_TABLET | Freq: Every day | ORAL | 2 refills | Status: DC

## 2016-08-10 NOTE — Telephone Encounter (Signed)
please call patient: Which ED medication does he mean?  Cialis?

## 2016-08-10 NOTE — Telephone Encounter (Signed)
Pt needs his Blood Pressure Medication and his ED Medication refilled and sent to the [facility] on [address].

## 2016-08-11 MED ORDER — SILDENAFIL CITRATE 20 MG PO TABS: ORAL_TABLET | ORAL | 10 refills | Status: DC

## 2016-08-11 NOTE — Telephone Encounter (Signed)
Patient stated he is on sildenafil

## 2016-08-11 NOTE — Telephone Encounter (Signed)
Ok, I sent rx

## 2016-10-04 ENCOUNTER — Other Ambulatory Visit: Payer: Self-pay | Admitting: Endocrinology

## 2016-10-06 ENCOUNTER — Telehealth: Payer: Self-pay | Admitting: Family Medicine

## 2016-10-06 NOTE — Telephone Encounter (Signed)
Pt had CT done on 05/19/16 and [employer] faxed over an authorization number on 10/05/16. The authorization number is [id number] and is valid from 05/19/16-08/17/16. I plugged this authorization into the referral as well.

## 2016-10-06 NOTE — Telephone Encounter (Signed)
FYI

## 2016-10-25 ENCOUNTER — Telehealth: Payer: Self-pay | Admitting: Neurology

## 2016-10-25 NOTE — Telephone Encounter (Signed)
Patient last seen in the office in January 2016 for facial numbness.  He needs a f/u visit to discuss his new complaints.  Earliest availability is 8/2 Thursday 8am; if he needs to be evaluated sooner, recommend he go to urgent care.  Donika K. Posey Pronto, DO

## 2016-10-25 NOTE — Telephone Encounter (Signed)
Please advise 

## 2016-10-25 NOTE — Telephone Encounter (Signed)
Called Mrs. Docken back and left message instructing her to take patient to UC if he needs to be seen immediately.  Otherwise she can call back to schedule a follow up appointment.  Informed her that we have 10-28-2016 at 8 am open.

## 2016-10-25 NOTE — Telephone Encounter (Signed)
Patient is having migraines and throwing up and vertigo really bad we had to cancel his appt in April due to patel being out of office. 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

## 2016-11-03 ENCOUNTER — Other Ambulatory Visit: Payer: Self-pay | Admitting: Endocrinology

## 2017-01-21 ENCOUNTER — Ambulatory Visit: Payer: Self-pay | Admitting: Neurology

## 2017-02-05 ENCOUNTER — Other Ambulatory Visit: Payer: Self-pay | Admitting: Endocrinology

## 2017-02-06 NOTE — Telephone Encounter (Signed)
Forwarded

## 2017-03-15 ENCOUNTER — Other Ambulatory Visit: Payer: Self-pay | Admitting: Endocrinology

## 2017-04-11 ENCOUNTER — Other Ambulatory Visit: Payer: Self-pay | Admitting: Endocrinology

## 2017-04-14 ENCOUNTER — Other Ambulatory Visit: Payer: Self-pay | Admitting: Gerontology

## 2017-04-14 ENCOUNTER — Ambulatory Visit (INDEPENDENT_AMBULATORY_CARE_PROVIDER_SITE_OTHER): Payer: Worker's Compensation

## 2017-04-14 DIAGNOSIS — M4602 Spinal enthesopathy, cervical region: Secondary | ICD-10-CM | POA: Diagnosis not present

## 2017-04-14 DIAGNOSIS — M549 Dorsalgia, unspecified: Secondary | ICD-10-CM

## 2017-04-14 DIAGNOSIS — R52 Pain, unspecified: Secondary | ICD-10-CM

## 2017-05-09 ENCOUNTER — Encounter: Payer: Self-pay | Admitting: Neurology

## 2017-05-09 ENCOUNTER — Ambulatory Visit (INDEPENDENT_AMBULATORY_CARE_PROVIDER_SITE_OTHER): Payer: BLUE CROSS/BLUE SHIELD | Admitting: Neurology

## 2017-05-09 VITALS — BP 120/80 | Resp 16 | Ht 69.5 in | Wt 249.1 lb

## 2017-05-09 DIAGNOSIS — G43109 Migraine with aura, not intractable, without status migrainosus: Secondary | ICD-10-CM

## 2017-05-09 DIAGNOSIS — H8110 Benign paroxysmal vertigo, unspecified ear: Secondary | ICD-10-CM | POA: Diagnosis not present

## 2017-05-09 MED ORDER — ELETRIPTAN HYDROBROMIDE 40 MG PO TABS: 40.0000 mg | ORAL_TABLET | ORAL | 5 refills | Status: DC | PRN

## 2017-05-09 MED ORDER — ONDANSETRON HCL 4 MG PO TABS: 4.0000 mg | ORAL_TABLET | Freq: Three times a day (TID) | ORAL | 1 refills | Status: DC | PRN

## 2017-05-09 NOTE — Progress Notes (Signed)
[facility] Clinic Note - Initial Visit   Date: 05/09/17  Ruben West MRN: [id number] DOB: [birth year]   Dear Dr. Loanne Drilling:  Thank you for your kind referral of Ruben West for consultation of migraine. Although his history is well known to you, please allow [name] to reiterate it for the purpose of our medical record. The patient was accompanied to the clinic by self.    History of Present Illness: Ruben West is a 62 y.o. right-handed Caucasian male with hyperlipidemia, hypertension, and nonalcoholic steatohepatitis presenting for evaluation of migraines and vertigo.    He has long history of migraines since 1983.  He was having migraines 1-2 times per year and now gets them every 2 months, lasting 24-48 hours. His headaches are bifrontal and bitemporal, with associated nasuea/vomting, photophobia, phonophobia.  He used to have has tingling over the side of the face, but he does not have this anymore.  He has tried imitrex 100mg  which does not provide significant relief, as it used to.   Triggers include:  Stress, sinusitis, caffienne.    He is works as a truckers.    He also complains of spells of vertigo, described as spinning sensation.  He has done his own vestibular exercises, which quickly improves his symptoms. These spells occurs about 2-3 months and lasts a few days.  It is triggered by sinus congestion.  He takes meclizine which helps.    Out-side paper records, electronic medical record, and images have been reviewed where available and summarized as:  MRI brain wo contrast 01/13/2013:  Normal MRA head 01/13/2013:  Normal  US carotids 01/13/2014: 1-39% stenosis bilaterally   Past Medical History:  Diagnosis Date  . ALLERGIC RHINITIS 11/23/2006  . Back pain   . 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 FATTY LIVER DISEASE 05/09/2008  . GERD 04/04/2007   no per pt  . Heart murmur   . Hypercholesteremia   . HYPERGLYCEMIA 04/04/2007  .  HYPERTENSION 11/23/2006  . Migraines   . NASH (nonalcoholic steatohepatitis)   . 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 PUD (peptic ulcer disease)     Past Surgical History:  Procedure Laterality Date  . COLONOSCOPY    . ELECTROCARDIOGRAM  02/21/2006  . VASECTOMY       Medications:  Outpatient Encounter Medications as of 05/09/2017  Medication Sig  . aspirin EC 81 MG tablet Take 81 mg by mouth every morning.  . fluticasone (FLONASE) 50 MCG/ACT nasal spray Place 2 sprays into both nostrils daily.  Marland Kitchen losartan-hydrochlorothiazide (HYZAAR) 50-12.5 MG tablet TAKE 1 TABLET BY MOUTH EVERY DAY  . lovastatin (MEVACOR) 40 MG tablet TAKE 1 TABLET BY MOUTH EVERYDAY AT BEDTIME  . sildenafil (REVATIO) 20 MG tablet 3-5 pills as needed for ED symptoms  . [DISCONTINUED] SUMAtriptan (IMITREX) 100 MG tablet TAKE 1 TABLET BY MOUTH EVERY 2 HOURS AS NEEDED FOR MIGRAINE *DO NOT EXCEED 2 PER DAY  . eletriptan (RELPAX) 40 MG tablet Take 1 tablet (40 mg total) by mouth as needed for migraine or headache. May repeat in 2 hours if headache persists or recurs.  . ondansetron (ZOFRAN) 4 MG tablet Take 1 tablet (4 mg total) by mouth every 8 (eight) hours as needed for nausea or vomiting.  . [DISCONTINUED] clotrimazole-betamethasone (LOTRISONE) cream Apply 1 application topically 3 (three) times daily as needed. For rash  . [DISCONTINUED] fluconazole (DIFLUCAN) 150 MG tablet Take 1 tablet (150 mg total) by mouth daily.   No facility-administered encounter medications  on file as of 05/09/2017.      Allergies: No Known Allergies  Family History: Family History  Problem Relation Age of Onset  . Heart attack Mother        MI, deceased 99  . Seizures Mother   . Emphysema Father        Deceased, 40  . Healthy Daughter        x2  . Seizures Grandchild   . Cancer Neg Hx        no cancer in immediate family  . Colon cancer Neg Hx   . Esophageal cancer Neg Hx   . Rectal cancer Neg Hx   . Stomach cancer Neg  Hx     Social History: Social History   Tobacco Use  . Smoking status: Never Smoker  . Smokeless tobacco: Never Used  Substance Use Topics  . Alcohol use: Yes    Alcohol/week: 0.0 oz    Comment: Rarely  . Drug use: No   Social History   Social History Narrative   Lives with wife in a one story home.  Has 2 grandchildren living with him.     Works as a Administrator.     Education: 10th grade.      Review of Systems:  CONSTITUTIONAL: No fevers, chills, night sweats, or weight loss.   EYES: No visual changes or eye pain ENT: No hearing changes.  No history of nose bleeds.   RESPIRATORY: No cough, wheezing and shortness of breath.   CARDIOVASCULAR: Negative for chest pain, and palpitations.   GI: Negative for abdominal discomfort, blood in stools or black stools.  No recent change in bowel habits.   GU:  No history of incontinence.   MUSCLOSKELETAL: No history of joint pain or swelling.  No myalgias.   SKIN: Negative for lesions, rash, and itching.   HEMATOLOGY/ONCOLOGY: Negative for prolonged bleeding, bruising easily, and swollen nodes.  No history of cancer.   ENDOCRINE: Negative for cold or heat intolerance, polydipsia or goiter.   PSYCH:  No depression or anxiety symptoms.   NEURO: As Above.   Vital Signs:  BP 120/80   Resp 16   Ht 5' 9.5" (1.765 m)   Wt 249 lb 2 oz (113 kg)   BMI 36.26 kg/m     General Medical Exam:   General:  Well appearing, comfortable.   Eyes/ENT: see cranial nerve examination.   Neck: No masses appreciated.  Full range of motion without tenderness.  No carotid bruits. Respiratory:  Clear to auscultation, good air entry bilaterally.   Cardiac:  Regular rate and rhythm, no murmur.   Extremities:  No deformities, edema, or skin discoloration.  Skin:  No rashes or lesions.  Neurological Exam: MENTAL STATUS including orientation to time, place, person, recent and remote memory, attention span and concentration, language, and fund of  knowledge is normal.  Speech is not dysarthric.  CRANIAL NERVES: II:  No visual field defects.  Unremarkable fundi.   III-IV-VI: Pupils equal round and reactive to light.  Normal conjugate, extra-ocular eye movements in all directions of gaze.  No nystagmus.  No ptosis.   V:  Normal facial sensation.    VII:  Normal facial symmetry and movements.   VIII:  Normal hearing and vestibular function.   IX-X:  Normal palatal movement.   XI:  Normal shoulder shrug and head rotation.   XII:  Normal tongue strength and range of motion, no deviation or fasciculation.  MOTOR: Motor strength is  5/5 throughout.  No atrophy, fasciculations or abnormal movements.  No pronator drift.  Tone is normal.    MSRs:  Right                                                                 Left brachioradialis 2+  brachioradialis 2+  biceps 2+  biceps 2+  triceps 2+  triceps 2+  patellar 2+  patellar 2+  ankle jerk 2+  ankle jerk 2+  Hoffman no  Hoffman no  plantar response down  plantar response down   SENSORY:  Normal and symmetric vibration throughout.   COORDINATION/GAIT: Normal finger-to- nose-finger.  Gait narrow based and stable.     IMPRESSION: 1.  Episodic migraine without aura  - Previously tried imitrex which has become less effective  - Start relpax 40mg  at headache onset, repeat in 2 hours if needed  - For nausea, take zofran 4mg    - Sample for Zomig 5mg  nasal spray also provided  - Keep headache journal  - Lifestyle modification to minimize headache triggers discussed  2.  BPPV  - Continue meclizine and Epley's maneuver as needed  Return to clinic in 6 months.  Thank you for allowing me to participate in patient's care.  If I can answer any additional questions, I would be pleased to do so.    Sincerely,    Donika K. Posey Pronto, DO

## 2017-05-09 NOTE — Patient Instructions (Addendum)
1.  Start Relpax 40mg  at onset of severe migraine 2.  Samples of Zomig nasal spray given 3.  You can try benadryl 25mg  and magnesium oxide 400-600mg  in combination for severe headache too  Return to clinic in 6 months

## 2017-06-07 DIAGNOSIS — M542 Cervicalgia: Secondary | ICD-10-CM | POA: Insufficient documentation

## 2017-06-08 DIAGNOSIS — M5412 Radiculopathy, cervical region: Secondary | ICD-10-CM | POA: Insufficient documentation

## 2017-06-13 ENCOUNTER — Encounter: Payer: Self-pay | Admitting: Endocrinology

## 2017-06-13 ENCOUNTER — Ambulatory Visit: Payer: BLUE CROSS/BLUE SHIELD | Admitting: Endocrinology

## 2017-06-13 DIAGNOSIS — E041 Nontoxic single thyroid nodule: Secondary | ICD-10-CM | POA: Diagnosis not present

## 2017-06-13 NOTE — Patient Instructions (Signed)
Let's check the ultrasound.  you will receive a phone call, about a day and time for an appointment.   I'll see you soon.

## 2017-06-13 NOTE — Progress Notes (Signed)
Subjective:    Patient ID: Ruben West, male    DOB: [birth year], 62 y.o.   MRN: [id number]  HPI Pt is referred by for nodular thyroid.  Pt was incidentally noted to have a nodule at the thyroid in 2019.  He is unaware of ever having had thyroid problems in the past.  He has no h/o XRT or surgery to the neck.   Past Medical History:  Diagnosis Date  . ALLERGIC RHINITIS 11/23/2006  . Back pain   . 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 FATTY LIVER DISEASE 05/09/2008  . GERD 04/04/2007   no per pt  . Heart murmur   . Hypercholesteremia   . HYPERGLYCEMIA 04/04/2007  . HYPERTENSION 11/23/2006  . Migraines   . NASH (nonalcoholic steatohepatitis)   . 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 PUD (peptic ulcer disease)     Past Surgical History:  Procedure Laterality Date  . COLONOSCOPY    . ELECTROCARDIOGRAM  02/21/2006  . VASECTOMY      Social History   Socioeconomic History  . Marital status: Married    Spouse name: Not on file  . Number of children: Not on file  . Years of education: Not on file  . Highest education level: Not on file  Social Needs  . Financial resource strain: Not on file  . Food insecurity - worry: Not on file  . Food insecurity - inability: Not on file  . Transportation needs - medical: Not on file  . Transportation needs - non-medical: Not on file  Occupational History  . Occupation: Therapist, sports: [employer]  Tobacco Use  . Smoking status: Never Smoker  . Smokeless tobacco: Never Used  Substance and Sexual Activity  . Alcohol use: Yes    Alcohol/week: 0.0 oz    Comment: Rarely  . Drug use: No  . Sexual activity: Not on file  Other Topics Concern  . Not on file  Social History Narrative   Lives with wife in a one story home.  Has 2 grandchildren living with him.     Works as a Administrator.     Education: 10th grade.      Current Outpatient Medications on File Prior to Visit  Medication Sig Dispense Refill  . aspirin EC  81 MG tablet Take 81 mg by mouth every morning.    . eletriptan (RELPAX) 40 MG tablet Take 1 tablet (40 mg total) by mouth as needed for migraine or headache. May repeat in 2 hours if headache persists or recurs. 10 tablet 5  . fluticasone (FLONASE) 50 MCG/ACT nasal spray Place 2 sprays into both nostrils daily. 16 g 6  . losartan-hydrochlorothiazide (HYZAAR) 50-12.5 MG tablet TAKE 1 TABLET BY MOUTH EVERY DAY 90 tablet 1  . lovastatin (MEVACOR) 40 MG tablet TAKE 1 TABLET BY MOUTH EVERYDAY AT BEDTIME 90 tablet 1  . ondansetron (ZOFRAN) 4 MG tablet Take 1 tablet (4 mg total) by mouth every 8 (eight) hours as needed for nausea or vomiting. 20 tablet 1  . sildenafil (REVATIO) 20 MG tablet 3-5 pills as needed for ED symptoms 30 tablet 10   No current facility-administered medications on file prior to visit.     No Known Allergies  Family History  Problem Relation Age of Onset  . Heart attack Mother        MI, deceased 83  . Seizures Mother   . Emphysema Father        Deceased, 6  .  Healthy Daughter        x2  . Seizures Grandchild   . Cancer Neg Hx        no cancer in immediate family  . Colon cancer Neg Hx   . Esophageal cancer Neg Hx   . Rectal cancer Neg Hx   . Stomach cancer Neg Hx     BP 138/86   Pulse 75   Ht 5' 9.5" (1.765 m)   Wt 251 lb (113.9 kg)   SpO2 94%   BMI 36.53 kg/m     Review of Systems Denies neck pain.    Objective:   Physical Exam VITAL SIGNS:  See vs page.  GENERAL: no distress.  NECK: 2 cm right thyroid nodule. Skin: not diaphoretic Neuro: no tremor      Assessment & Plan:  Thyroid nodule, new.  Check [name]

## 2017-06-22 ENCOUNTER — Ambulatory Visit
Admission: RE | Admit: 2017-06-22 | Discharge: 2017-06-22 | Disposition: A | Discharge status: Home / Self Care | Payer: BLUE CROSS/BLUE SHIELD | Source: Ambulatory Visit | Attending: Endocrinology | Admitting: Endocrinology

## 2017-06-22 DIAGNOSIS — E041 Nontoxic single thyroid nodule: Secondary | ICD-10-CM

## 2017-06-29 ENCOUNTER — Encounter: Payer: Self-pay | Admitting: Endocrinology

## 2017-06-29 ENCOUNTER — Ambulatory Visit (INDEPENDENT_AMBULATORY_CARE_PROVIDER_SITE_OTHER): Payer: BLUE CROSS/BLUE SHIELD | Admitting: Endocrinology

## 2017-06-29 ENCOUNTER — Telehealth: Payer: Self-pay | Admitting: Endocrinology

## 2017-06-29 VITALS — BP 118/82 | HR 84 | Temp 98.8°F | Ht 70.0 in | Wt 249.6 lb

## 2017-06-29 DIAGNOSIS — M791 Myalgia, unspecified site: Secondary | ICD-10-CM | POA: Insufficient documentation

## 2017-06-29 DIAGNOSIS — Z0000 Encounter for general adult medical examination without abnormal findings: Secondary | ICD-10-CM

## 2017-06-29 DIAGNOSIS — R200 Anesthesia of skin: Secondary | ICD-10-CM

## 2017-06-29 DIAGNOSIS — E041 Nontoxic single thyroid nodule: Secondary | ICD-10-CM

## 2017-06-29 DIAGNOSIS — Z125 Encounter for screening for malignant neoplasm of prostate: Secondary | ICD-10-CM | POA: Diagnosis not present

## 2017-06-29 DIAGNOSIS — R7309 Other abnormal glucose: Secondary | ICD-10-CM | POA: Diagnosis not present

## 2017-06-29 LAB — CBC WITH DIFFERENTIAL/PLATELET
Basophils Absolute: 0.1 10*3/uL (ref 0.0–0.1)
Basophils Relative: 0.9 % (ref 0.0–3.0)
EOS PCT: 1.8 % (ref 0.0–5.0)
Eosinophils Absolute: 0.1 10*3/uL (ref 0.0–0.7)
HCT: 46.6 % (ref 39.0–52.0)
Hemoglobin: 16.4 g/dL (ref 13.0–17.0)
LYMPHS ABS: 2.7 10*3/uL (ref 0.7–4.0)
Lymphocytes Relative: 33.6 % (ref 12.0–46.0)
MCHC: 35.1 g/dL (ref 30.0–36.0)
MCV: 87.1 fl (ref 78.0–100.0)
MONOS PCT: 9.3 % (ref 3.0–12.0)
Monocytes Absolute: 0.7 10*3/uL (ref 0.1–1.0)
NEUTROS ABS: 4.4 10*3/uL (ref 1.4–7.7)
NEUTROS PCT: 54.4 % (ref 43.0–77.0)
PLATELETS: 231 10*3/uL (ref 150.0–400.0)
RBC: 5.35 Mil/uL (ref 4.22–5.81)
RDW: 12.6 % (ref 11.5–15.5)
WBC: 8 10*3/uL (ref 4.0–10.5)

## 2017-06-29 LAB — URINALYSIS, ROUTINE W REFLEX MICROSCOPIC
Bilirubin Urine: NEGATIVE
Hgb urine dipstick: NEGATIVE
Ketones, ur: NEGATIVE
Leukocytes, UA: NEGATIVE
Nitrite: NEGATIVE
PH: 6 (ref 5.0–8.0)
RBC / HPF: NONE SEEN (ref 0–?)
Specific Gravity, Urine: 1.025 (ref 1.000–1.030)
TOTAL PROTEIN, URINE-UPE24: NEGATIVE
Urine Glucose: NEGATIVE
Urobilinogen, UA: 0.2 (ref 0.0–1.0)

## 2017-06-29 LAB — SEDIMENTATION RATE: Sed Rate: 7 mm/hr (ref 0–20)

## 2017-06-29 LAB — BASIC METABOLIC PANEL
BUN: 12 mg/dL (ref 6–23)
CHLORIDE: 103 meq/L (ref 96–112)
CO2: 31 mEq/L (ref 19–32)
Calcium: 9.4 mg/dL (ref 8.4–10.5)
Creatinine, Ser: 1.02 mg/dL (ref 0.40–1.50)
GFR: 78.7 mL/min (ref >60.00)
Glucose, Bld: 93 mg/dL (ref 70–99)
POTASSIUM: 4.1 meq/L (ref 3.5–5.1)
Sodium: 141 mEq/L (ref 135–145)

## 2017-06-29 LAB — HEPATIC FUNCTION PANEL
ALBUMIN: 3.8 g/dL (ref 3.5–5.2)
ALT: 60 U/L — ABNORMAL HIGH (ref 0–53)
AST: 40 U/L — AB (ref 0–37)
Alkaline Phosphatase: 73 U/L (ref 39–117)
Bilirubin, Direct: 0.2 mg/dL (ref 0.0–0.3)
Total Bilirubin: 0.9 mg/dL (ref 0.2–1.2)
Total Protein: 6.7 g/dL (ref 6.0–8.3)

## 2017-06-29 LAB — LDL CHOLESTEROL, DIRECT: Direct LDL: 69 mg/dL

## 2017-06-29 LAB — LIPID PANEL
CHOL/HDL RATIO: 4
CHOLESTEROL: 128 mg/dL (ref 0–200)
HDL: 34.8 mg/dL — AB (ref >39.00)
NonHDL: 92.99
Triglycerides: 239 mg/dL — ABNORMAL HIGH (ref 0.0–149.0)
VLDL: 47.8 mg/dL — AB (ref 0.0–40.0)

## 2017-06-29 LAB — HEMOGLOBIN A1C: Hgb A1c MFr Bld: 5.2 % (ref 4.6–6.5)

## 2017-06-29 LAB — TSH: TSH: 3.41 u[IU]/mL (ref 0.35–4.50)

## 2017-06-29 LAB — CK: Total CK: 163 U/L (ref 7–232)

## 2017-06-29 LAB — PSA: PSA: 0.76 ng/mL (ref 0.10–4.00)

## 2017-06-29 NOTE — Telephone Encounter (Signed)
please call patient to make appt for biopsy when we get more topical anesthetic spray in, thanks.

## 2017-06-29 NOTE — Progress Notes (Signed)
Subjective:    Patient ID: Ruben West, male    DOB: 1955-11-18, 62 y.o.   MRN: [id number]  HPI Pt is here for regular wellness examination, and is feeling pretty well in general, and says chronic med probs are stable, except as noted below Past Medical History:  Diagnosis Date  . ALLERGIC RHINITIS 11/23/2006  . Back pain   . 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 FATTY LIVER DISEASE 05/09/2008  . GERD 04/04/2007   no per pt  . Heart murmur   . Hypercholesteremia   . HYPERGLYCEMIA 04/04/2007  . HYPERTENSION 11/23/2006  . Migraines   . NASH (nonalcoholic steatohepatitis)   . 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 PUD (peptic ulcer disease)     Past Surgical History:  Procedure Laterality Date  . COLONOSCOPY    . ELECTROCARDIOGRAM  02/21/2006  . VASECTOMY      Social History   Socioeconomic History  . Marital status: Married    Spouse name: Not on file  . Number of children: Not on file  . Years of education: Not on file  . Highest education level: Not on file  Occupational History  . Occupation: Therapist, sports: [employer]  Social Needs  . Financial resource strain: Not on file  . Food insecurity:    Worry: Not on file    Inability: Not on file  . Transportation needs:    Medical: Not on file    Non-medical: Not on file  Tobacco Use  . Smoking status: Never Smoker  . Smokeless tobacco: Never Used  Substance and Sexual Activity  . Alcohol use: Yes    Alcohol/week: 0.0 oz    Comment: Rarely  . Drug use: No  . Sexual activity: Not on file  Lifestyle  . Physical activity:    Days per week: Not on file    Minutes per session: Not on file  . Stress: Not on file  Relationships  . Social connections:    Talks on phone: Not on file    Gets together: Not on file    Attends religious service: Not on file    Active member of club or organization: Not on file    Attends meetings of clubs or organizations: Not on file    Relationship status: Not  on file  . Intimate partner violence:    Fear of current or ex partner: Not on file    Emotionally abused: Not on file    Physically abused: Not on file    Forced sexual activity: Not on file  Other Topics Concern  . Not on file  Social History Narrative   Lives with wife in a one story home.  Has 2 grandchildren living with him.     Works as a Administrator.     Education: 10th grade.      Current Outpatient Medications on File Prior to Visit  Medication Sig Dispense Refill  . aspirin EC 81 MG tablet Take 81 mg by mouth every morning.    . eletriptan (RELPAX) 40 MG tablet Take 1 tablet (40 mg total) by mouth as needed for migraine or headache. May repeat in 2 hours if headache persists or recurs. 10 tablet 5  . fluticasone (FLONASE) 50 MCG/ACT nasal spray Place 2 sprays into both nostrils daily. 16 g 6  . losartan-hydrochlorothiazide (HYZAAR) 50-12.5 MG tablet TAKE 1 TABLET BY MOUTH EVERY DAY 90 tablet 1  . lovastatin (MEVACOR) 40 MG tablet TAKE 1  TABLET BY MOUTH EVERYDAY AT BEDTIME 90 tablet 1  . ondansetron (ZOFRAN) 4 MG tablet Take 1 tablet (4 mg total) by mouth every 8 (eight) hours as needed for nausea or vomiting. 20 tablet 1  . sildenafil (REVATIO) 20 MG tablet 3-5 pills as needed for ED symptoms 30 tablet 10   No current facility-administered medications on file prior to visit.     No Known Allergies  Family History  Problem Relation Age of Onset  . Heart attack Mother        MI, deceased 14  . Seizures Mother   . Emphysema Father        Deceased, 39  . Healthy Daughter        x2  . Seizures Grandchild   . Cancer Neg Hx        no cancer in immediate family  . Colon cancer Neg Hx   . Esophageal cancer Neg Hx   . Rectal cancer Neg Hx   . Stomach cancer Neg Hx     BP 118/82 (BP Location: Left Arm, Patient Position: Sitting, Cuff Size: Normal)   Pulse 84   Temp 98.8 F (37.1 C) (Oral)   Ht 5\' 10"  (1.778 m)   Wt 249 lb 9.6 oz (113.2 kg)   SpO2 94%   BMI 35.81  kg/m    Review of Systems .Denies fever, fatigue, visual loss, hearing loss, chest pain, sob, depression, cold intolerance, BRBPR, hematuria, syncope, easy bruising, and rash.  No change in low-back pain.  He has rhinorrhea.  He has myalgias at the upper arms    Objective:   Physical Exam VS: see vs page GEN: no distress HEAD: head: no deformity eyes: no periorbital swelling, no proptosis external nose and ears are normal mouth: no lesion seen NECK: supple, thyroid is not enlarged CHEST WALL: no deformity LUNGS: clear to auscultation CV: reg rate and rhythm, no murmur ABD: abdomen is soft, nontender.  no hepatosplenomegaly.  not distended.  no hernia.   MUSCULOSKELETAL: muscle bulk and strength are grossly normal.  no obvious joint swelling.  gait is normal and steady EXTEMITIES: no deformity.  no ulcer on the feet.  feet are of normal color and temp.  no edema.   PULSES: dorsalis pedis intact bilat.  no carotid bruit NEURO:  cn 2-12 grossly intact.   readily moves all 4's.  sensation is intact to touch on the feet SKIN:  Normal texture and temperature.  No rash or suspicious lesion is visible.   NODES:  None palpable at the neck PSYCH: alert, well-oriented.  Does not appear anxious nor depressed.  I personally reviewed electrocardiogram tracing (today):  Indication: HTN Impression: NSR.  No MI.  No hypertrophy.  Arm lead reversal.   Compared to 2018: Arm lead reversal is new.        Assessment & Plan:  Wellness visit today, with problems stable, except as noted.   Patient Instructions  Please consider these measures for your health:  minimize alcohol.  Do not use tobacco products.  Have a colonoscopy at least every 10 years from age 65.  Keep firearms safely stored.  Always use seat belts.  have working smoke alarms in your home.  See an eye doctor and dentist regularly.  Never drive under the influence of alcohol or drugs (including prescription drugs).  Those with fair skin  should take precautions against the sun, and should carefully examine their skin once per month, for any new or changed moles.  blood tests are requested for you today.  We'll let you know about the results. Please come back for a follow-up appointment in 1 year.

## 2017-06-29 NOTE — Patient Instructions (Signed)
Please consider these measures for your health:  minimize alcohol.  Do not use tobacco products.  Have a colonoscopy at least every 10 years from age 62.  Keep firearms safely stored.  Always use seat belts.  have working smoke alarms in your home.  See an eye doctor and dentist regularly.  Never drive under the influence of alcohol or drugs (including prescription drugs).  Those with fair skin should take precautions against the sun, and should carefully examine their skin once per month, for any new or changed moles. blood tests are requested for you today.  We'll let you know about the results. Please come back for a follow-up appointment in 1 year.

## 2017-07-01 ENCOUNTER — Telehealth: Payer: Self-pay | Admitting: Endocrinology

## 2017-07-01 MED ORDER — PIOGLITAZONE HCL 15 MG PO TABS: 15.0000 mg | ORAL_TABLET | Freq: Every day | ORAL | 3 refills | Status: DC

## 2017-07-01 NOTE — Telephone Encounter (Signed)
Ok, I have sent a prescription to your pharmacy 

## 2017-07-01 NOTE — Telephone Encounter (Signed)
Patient called about getting his biopsy rescheduled from yesterday.  He also has some questions about the lab results he received from his physical.    Please advise\

## 2017-07-01 NOTE — Telephone Encounter (Signed)
Patient notified rx has been sent. He voiced understanding and had no further questions at this time.

## 2017-07-01 NOTE — Telephone Encounter (Signed)
I LVM for patient to call back to make appointment. We are getting spray ordered today so I was going to make appt for around a month out.

## 2017-07-01 NOTE — Telephone Encounter (Signed)
Patient stated that he was interested in taking pioglitazone for his liver.

## 2017-07-26 ENCOUNTER — Ambulatory Visit: Payer: BLUE CROSS/BLUE SHIELD | Admitting: Endocrinology

## 2017-07-26 ENCOUNTER — Other Ambulatory Visit (HOSPITAL_COMMUNITY)
Admission: RE | Admit: 2017-07-26 | Discharge: 2017-07-26 | Disposition: A | Discharge status: Home / Self Care | Payer: BLUE CROSS/BLUE SHIELD | Source: Ambulatory Visit | Attending: Endocrinology | Admitting: Endocrinology

## 2017-07-26 ENCOUNTER — Encounter: Payer: Self-pay | Admitting: Endocrinology

## 2017-07-26 VITALS — BP 128/80 | HR 86 | Wt 250.6 lb

## 2017-07-26 DIAGNOSIS — E041 Nontoxic single thyroid nodule: Secondary | ICD-10-CM

## 2017-07-26 NOTE — Patient Instructions (Signed)
We'll let you know about the biopsy results. I'll see you next time.

## 2017-07-26 NOTE — Progress Notes (Signed)
   Subjective:    Patient ID: Ruben West, male    DOB: 06-02-1955, 62 y.o.   MRN: [id number]  HPI thyroid needle bx: consent obtained, signed form on chart The area is first sprayed with cooling agent local: xylocaine 2%, with epinephrine prep: alcohol pad 4 bxs are done with 25 and 06C needles no complications    Review of Systems     Objective:   Physical Exam VITAL SIGNS:  See vs page GENERAL: no distress Neck: the right thyroid nodule is palpated       Assessment & Plan:   Patient Instructions  We'll let you know about the biopsy results. I'll see you next time.

## 2017-08-05 ENCOUNTER — Other Ambulatory Visit: Payer: Self-pay | Admitting: Endocrinology

## 2017-08-11 ENCOUNTER — Encounter: Payer: Self-pay | Admitting: Gastroenterology

## 2017-08-16 DIAGNOSIS — M5416 Radiculopathy, lumbar region: Secondary | ICD-10-CM | POA: Insufficient documentation

## 2017-08-16 DIAGNOSIS — M5030 Other cervical disc degeneration, unspecified cervical region: Secondary | ICD-10-CM | POA: Insufficient documentation

## 2017-08-17 ENCOUNTER — Encounter: Payer: Self-pay | Admitting: Gastroenterology

## 2017-08-22 ENCOUNTER — Encounter: Payer: Self-pay | Admitting: Family Medicine

## 2017-08-24 ENCOUNTER — Other Ambulatory Visit: Payer: Self-pay

## 2017-08-24 ENCOUNTER — Ambulatory Visit (AMBULATORY_SURGERY_CENTER): Payer: Self-pay

## 2017-08-24 VITALS — Ht 69.0 in | Wt 255.8 lb

## 2017-08-24 DIAGNOSIS — Z86010 Personal history of colonic polyps: Secondary | ICD-10-CM

## 2017-08-24 MED ORDER — NA SULFATE-K SULFATE-MG SULF 17.5-3.13-1.6 GM/177ML PO SOLN: 1.0000 | Freq: Once | ORAL | 0 refills | Status: AC

## 2017-08-24 NOTE — Progress Notes (Signed)
No egg or soy allergy known to patient  No issues with past sedation with any surgeries  or procedures, no intubation problems  No diet pills per patient No home 02 use per patient  No blood thinners per patient  Pt denies issues with constipation  No A fib or A flutter  EMMI video sent to pt's e mail , pt declined    

## 2017-08-30 ENCOUNTER — Encounter: Payer: Self-pay | Admitting: Gastroenterology

## 2017-08-30 ENCOUNTER — Other Ambulatory Visit: Payer: Self-pay

## 2017-08-30 ENCOUNTER — Telehealth: Payer: Self-pay | Admitting: Gastroenterology

## 2017-08-30 ENCOUNTER — Ambulatory Visit (AMBULATORY_SURGERY_CENTER): Payer: BLUE CROSS/BLUE SHIELD | Admitting: Gastroenterology

## 2017-08-30 VITALS — BP 117/79 | HR 65 | Temp 98.4°F | Resp 14 | Ht 69.0 in | Wt 255.0 lb

## 2017-08-30 DIAGNOSIS — D123 Benign neoplasm of transverse colon: Secondary | ICD-10-CM | POA: Diagnosis not present

## 2017-08-30 DIAGNOSIS — D125 Benign neoplasm of sigmoid colon: Secondary | ICD-10-CM

## 2017-08-30 DIAGNOSIS — D124 Benign neoplasm of descending colon: Secondary | ICD-10-CM

## 2017-08-30 DIAGNOSIS — Z86010 Personal history of colonic polyps: Secondary | ICD-10-CM | POA: Diagnosis not present

## 2017-08-30 DIAGNOSIS — K639 Disease of intestine, unspecified: Secondary | ICD-10-CM | POA: Diagnosis not present

## 2017-08-30 DIAGNOSIS — K529 Noninfective gastroenteritis and colitis, unspecified: Secondary | ICD-10-CM | POA: Diagnosis not present

## 2017-08-30 MED ORDER — SODIUM CHLORIDE 0.9 % IV SOLN: 500.0000 mL | Freq: Once | INTRAVENOUS | Status: DC

## 2017-08-30 NOTE — Progress Notes (Signed)
Pt. Reports no change in his medical or surgical history since his pre-visit 08/24/2017.  Pt. Does have a headache, he does get migraine headaches a few times a year.

## 2017-08-30 NOTE — Telephone Encounter (Signed)
Colonoscopy today, went smoothly.  No abd pain, ate dinner without issues.  IN evening started having shaking chills.  No tenderness.  No nasuea, vomiting or abd pains at all. No thermometer at home but wife doesn't think he's warm.    Doubt perforation.  I have seen similar situation with propofol in past.  Usually transient. I advised tylenol and rest. He knows to call back if he worsens and specifically if any abd pains.  Ardine Bjork

## 2017-08-30 NOTE — Op Note (Signed)
[facility] Patient Name: Ruben West Procedure Date: 08/30/2017 2:04 PM MRN: [id number] Endoscopist: Remo Lipps P. Havery Moros , MD Age: 62 Referring MD:  Date of Birth: 1956-03-10 Gender: Male Account #: [id number] Procedure:                Colonoscopy Indications:              High risk colon cancer surveillance: Personal                            history of colonic polyps Medicines:                Monitored Anesthesia Care Procedure:                Pre-Anesthesia Assessment:                           - Prior to the procedure, a History and Physical                            was performed, and patient medications and                            allergies were reviewed. The patient's tolerance of                            previous anesthesia was also reviewed. The risks                            and benefits of the procedure and the sedation                            options and risks were discussed with the patient.                            All questions were answered, and informed consent                            was obtained. Prior Anticoagulants: The patient has                            taken no previous anticoagulant or antiplatelet                            agents. ASA Grade Assessment: II - A patient with                            mild systemic disease. After reviewing the risks                            and benefits, the patient was deemed in                            satisfactory condition to undergo the procedure.  After obtaining informed consent, the colonoscope                            was passed under direct vision. Throughout the                            procedure, the patient's blood pressure, pulse, and                            oxygen saturations were monitored continuously. The                            Colonoscope was introduced through the anus and                            advanced to the the cecum, identified  by                            appendiceal orifice and ileocecal valve. The                            colonoscopy was performed without difficulty. The                            patient tolerated the procedure well. The quality                            of the bowel preparation was adequate. The                            ileocecal valve, appendiceal orifice, and rectum                            were photographed. Scope In: 2:11:39 PM Scope Out: 2:35:45 PM Scope Withdrawal Time: 0 hours 22 minutes 26 seconds  Total Procedure Duration: 0 hours 24 minutes 6 seconds  Findings:                 The perianal and digital rectal examinations were                            normal.                           A localized area of superficial erythematous mucosa                            was found at the ileocecal valve, I suspect prep                            artifact . Biopsies were taken with a cold forceps                            for histology.  Two sessile polyps were found in the hepatic                            flexure. The polyps were 4 mm in size. These polyps                            were removed with a cold snare. Resection and                            retrieval were complete.                           A 4 mm polyp was found in the descending colon. The                            polyp was sessile. The polyp was removed with a                            cold snare. Resection and retrieval were complete.                           A 4 mm polyp was found in the sigmoid colon. The                            polyp was sessile. The polyp was removed with a                            cold snare. Resection and retrieval were complete.                           The exam was otherwise without abnormality on                            direct and retroflexion views. The ileum was                            difficult to intubate - limited views appeared                             normal Complications:            No immediate complications. Estimated blood loss:                            Minimal. Estimated Blood Loss:     Estimated blood loss was minimal. Impression:               - Superficial erythematous mucosa at the ileocecal                            valve. Biopsied.                           - Two 4 mm polyps at the hepatic flexure, removed  with a cold snare. Resected and retrieved.                           - One 4 mm polyp in the descending colon, removed                            with a cold snare. Resected and retrieved.                           - One 4 mm polyp in the sigmoid colon, removed with                            a cold snare. Resected and retrieved.                           - The examination was otherwise normal on direct                            and retroflexion views. Recommendation:           - Patient has a contact number available for                            emergencies. The signs and symptoms of potential                            delayed complications were discussed with the                            patient. Return to normal activities tomorrow.                            Written discharge instructions were provided to the                            patient.                           - Resume previous diet.                           - Continue present medications.                           - Await pathology results.                           - Repeat colonoscopy for surveillance based on                            pathology results. Remo Lipps P. Jazilyn Siegenthaler, MD 08/30/2017 2:40:43 PM This report has been signed electronically.

## 2017-08-30 NOTE — Telephone Encounter (Signed)
Returned pts phone call.  He states that he started throwing up and has developed a migraine this morning.  He finished both halves of the prep last night and this morning but started vomiting about an hour ago.  He has medication at home for migraines and has taken Zomig and Zofran for the nausea.   He has had 2 BM's today that were both "yellow liquid" with no solid stool.  He feels that he can have procedure but wanted to make [name] aware.  Please advise.

## 2017-08-30 NOTE — Patient Instructions (Signed)
YOU HAD AN ENDOSCOPIC PROCEDURE TODAY AT THE [facility]:   Refer to the procedure report that was given to you for any specific questions about what was found during the examination.  If the procedure report does not answer your questions, please call your gastroenterologist to clarify.  If you requested that your care partner not be given the details of your procedure findings, then the procedure report has been included in a sealed envelope for you to review at your convenience later.  YOU SHOULD EXPECT: Some feelings of bloating in the abdomen. Passage of more gas than usual.  Walking can help get rid of the air that was put into your GI tract during the procedure and reduce the bloating. If you had a lower endoscopy (such as a colonoscopy or flexible sigmoidoscopy) you may notice spotting of blood in your stool or on the toilet paper. If you underwent a bowel prep for your procedure, you may not have a normal bowel movement for a few days.  Please Note:  You might notice some irritation and congestion in your nose or some drainage.  This is from the oxygen used during your procedure.  There is no need for concern and it should clear up in a day or so.  SYMPTOMS TO REPORT IMMEDIATELY:   Following lower endoscopy (colonoscopy or flexible sigmoidoscopy):  Excessive amounts of blood in the stool  Significant tenderness or worsening of abdominal pains  Swelling of the abdomen that is new, acute  Fever of 100F or higher  Please see handouts given to you on Polyps.  For urgent or emergent issues, a gastroenterologist can be reached at any hour by calling [phone].   DIET:  We do recommend a small meal at first, but then you may proceed to your regular diet.  Drink plenty of fluids but you should avoid alcoholic beverages for 24 hours.  ACTIVITY:  You should plan to take it easy for the rest of today and you should NOT DRIVE or use heavy machinery until tomorrow (because of the  sedation medicines used during the test).    FOLLOW UP: Our staff will call the number listed on your records the next business day following your procedure to check on you and address any questions or concerns that you may have regarding the information given to you following your procedure. If we do not reach you, we will leave a message.  However, if you are feeling well and you are not experiencing any problems, there is no need to return our call.  We will assume that you have returned to your regular daily activities without incident.  If any biopsies were taken you will be contacted by phone or by letter within the next 1-3 weeks.  Please call us at [phone] if you have not heard about the biopsies in 3 weeks.    SIGNATURES/CONFIDENTIALITY: You and/or your care partner have signed paperwork which will be entered into your electronic medical record.  These signatures attest to the fact that that the information above on your After Visit Summary has been reviewed and is understood.  Full responsibility of the confidentiality of this discharge information lies with you and/or your care-partner.  Thank you for letting us take care of your healthcare needs today. 

## 2017-08-30 NOTE — Progress Notes (Signed)
Spontaneous respirations throughout. VSS. Resting comfortably. To PACU on room air. Report to  RN. 

## 2017-08-30 NOTE — Telephone Encounter (Signed)
Spoke with Dr. Havery Moros.  He states that he is ok to proceed with procedure at scheduled time.

## 2017-08-30 NOTE — Progress Notes (Signed)
Called to room to assist during endoscopic procedure.  Patient ID and intended procedure confirmed with present staff. Received instructions for my participation in the procedure from the performing physician.  

## 2017-08-31 ENCOUNTER — Telehealth: Payer: Self-pay | Admitting: *Deleted

## 2017-08-31 ENCOUNTER — Other Ambulatory Visit: Payer: Self-pay | Admitting: Endocrinology

## 2017-08-31 NOTE — Telephone Encounter (Signed)
Thanks Linna Hoff, I appreciate your message.  Almyra Free can you touch base with this patient later today if Endo has not gotten ahold of him to ensure he is feeling okay? He did not have any high risk interventions during his colonoscopy yesterday. Thanks

## 2017-08-31 NOTE — Telephone Encounter (Signed)
Ruben West in Endo contacted patient, see phone note.

## 2017-08-31 NOTE — Telephone Encounter (Signed)
Sounds like he has improved, thanks for the follow up

## 2017-08-31 NOTE — Telephone Encounter (Signed)
  Follow up Call-  Call back number 08/30/2017  Post procedure Call Back phone  # [phone]  Permission to leave phone message Yes  Some recent data might be hidden     Patient questions:  Do you have a fever, pain , or abdominal swelling? No. Pain Score  0 *  Have you tolerated food without any problems? Yes.    Have you been able to return to your normal activities? Yes.    Do you have any questions about your discharge instructions: Diet   No. Medications  No. Follow up visit  No.  Do you have questions or concerns about your Care? No.  Actions: * If pain score is 4 or above: No action needed, pain <4.  Pt states that he is doing much better today.  He has been able to eat this morning and complains of no nausea or headache.  Instructed to call if he has any questions or concerns.

## 2017-09-09 HISTORY — PX: COLONOSCOPY: SHX174

## 2017-09-12 ENCOUNTER — Encounter: Payer: Self-pay | Admitting: Gastroenterology

## 2017-09-18 ENCOUNTER — Other Ambulatory Visit: Payer: Self-pay | Admitting: Endocrinology

## 2017-09-19 ENCOUNTER — Other Ambulatory Visit: Payer: Self-pay

## 2017-09-19 NOTE — Telephone Encounter (Signed)
Should patient be on this medication? I see where another prescriber d/c it?

## 2017-11-07 ENCOUNTER — Ambulatory Visit: Payer: Self-pay | Admitting: Neurology

## 2017-12-03 ENCOUNTER — Emergency Department (INDEPENDENT_AMBULATORY_CARE_PROVIDER_SITE_OTHER)
Admission: EM | Admit: 2017-12-03 | Discharge: 2017-12-03 | Disposition: A | Discharge status: Home / Self Care | Payer: Worker's Compensation | Source: Home / Self Care | Attending: Family Medicine | Admitting: Family Medicine

## 2017-12-03 ENCOUNTER — Encounter: Payer: Self-pay | Admitting: Emergency Medicine

## 2017-12-03 ENCOUNTER — Emergency Department (INDEPENDENT_AMBULATORY_CARE_PROVIDER_SITE_OTHER): Payer: Worker's Compensation

## 2017-12-03 ENCOUNTER — Other Ambulatory Visit: Payer: Self-pay

## 2017-12-03 DIAGNOSIS — M533 Sacrococcygeal disorders, not elsewhere classified: Secondary | ICD-10-CM | POA: Diagnosis not present

## 2017-12-03 DIAGNOSIS — M50321 Other cervical disc degeneration at C4-C5 level: Secondary | ICD-10-CM

## 2017-12-03 DIAGNOSIS — M545 Low back pain, unspecified: Secondary | ICD-10-CM

## 2017-12-03 DIAGNOSIS — M549 Dorsalgia, unspecified: Secondary | ICD-10-CM

## 2017-12-03 DIAGNOSIS — M5134 Other intervertebral disc degeneration, thoracic region: Secondary | ICD-10-CM

## 2017-12-03 DIAGNOSIS — M47897 Other spondylosis, lumbosacral region: Secondary | ICD-10-CM | POA: Diagnosis not present

## 2017-12-03 DIAGNOSIS — S3992XA Unspecified injury of lower back, initial encounter: Secondary | ICD-10-CM

## 2017-12-03 DIAGNOSIS — W010XXA Fall on same level from slipping, tripping and stumbling without subsequent striking against object, initial encounter: Secondary | ICD-10-CM

## 2017-12-03 DIAGNOSIS — S29012A Strain of muscle and tendon of back wall of thorax, initial encounter: Secondary | ICD-10-CM | POA: Diagnosis not present

## 2017-12-03 MED ORDER — CYCLOBENZAPRINE HCL 5 MG PO TABS: 5.0000 mg | ORAL_TABLET | Freq: Two times a day (BID) | ORAL | 0 refills | Status: DC | PRN

## 2017-12-03 NOTE — ED Triage Notes (Signed)
Pt presents today for a workman's comp injury that happened on Friday 12/02/2017. 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 He states the tile shower was very slippery and as he was getting out he stepped into a puddle of water and twisted his body to grab a nearby chair to prevent falling. States he did catch himself but his back twisted and immediately he began having lower back pain, Right knee pain, a headache and neck pain. States he did put ice on his lower back with 100% relief and was able to drive home. States as the day went on, and this morning the pain has worsened and his headache/ neck pain is a 10/10 and the lower back pain is a 4/10. Along with the ice, he's taken goody powder with minimal relief. He states he does have chronic back issues and just returned to work in June from an upper back injury but states it has been resolved.

## 2017-12-03 NOTE — ED Provider Notes (Signed)
[facility]    CSN: [id number] Arrival date & time: 12/03/17  9485     History   Chief Complaint Chief Complaint  Patient presents with  . Back Pain    Workman's Comp Injury- Injury on 12/02/17 at approm 10:30-10:45 in [place], [place]    HPI Ruben West is a 62 y.o. male.   HPI Ruben West is a 62 y.o. 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  He initially injured his upper back a few months ago and was released back to work in June. He is concerned he re-injured his back. He has been applying ice and taking Goody powder with minimal relief. Pain is 4/10, but was 10/10 earlier this morning along with muscle spasms. Denies pain or numbness in arms or legs.    Past Medical History:  Diagnosis Date  . ALLERGIC RHINITIS 11/23/2006  . Back pain   . 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 FATTY LIVER DISEASE 05/09/2008  . GERD 04/04/2007   no per pt  . Headache    Migraines a few times a year 1993  . Heart murmur   . Hypercholesteremia   . HYPERGLYCEMIA 04/04/2007  . HYPERTENSION 11/23/2006  . Migraines   . NASH (nonalcoholic steatohepatitis)   . 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 PUD (peptic ulcer disease)     Patient Active Problem List   Diagnosis Date Noted  . Myalgia 06/29/2017  . Right thyroid nodule 06/13/2017  . Vertigo 04/30/2016  . Degenerative arthritis of knee, bilateral 01/01/2016  . Benign paroxysmal positional vertigo 12/10/2015  . Wellness examination 06/10/2015  . Screening for prostate cancer 06/10/2015  . Allergic rhinitis 06/10/2015  . Skin nodule 06/10/2015  . Screening examination for infectious disease 03/04/2014  . Obesity (BMI 30-39.9) 01/13/2014  . Numbness 01/12/2014  . Migraine with aura 01/12/2014  . Atypical chest pain 01/12/2014  . Hyperlipidemia 01/12/2014  . Migraine, unspecified,  without mention of intractable migraine without mention of status migrainosus 06/30/2012  . Depressive disorder, not elsewhere classified 05/22/2012  . Chronic meniscal tear of knee 12/22/2011  . FATTY LIVER DISEASE 05/09/2008  . OTHER TESTICULAR HYPOFUNCTION 05/19/2007  . GERD 04/04/2007  . 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 HYPERGLYCEMIA 04/04/2007  . Essential hypertension 11/23/2006  . ALLERGIC RHINITIS 11/23/2006    Past Surgical History:  Procedure Laterality Date  . COLONOSCOPY    . ELECTROCARDIOGRAM  02/21/2006  . KNEE SURGERY Left    2015  . VASECTOMY         Home Medications    Prior to Admission medications   Medication Sig Start Date End Date Taking? Authorizing Provider  aspirin EC 81 MG tablet Take 81 mg by mouth every morning.    [provider]  cyclobenzaprine (FLEXERIL) 5 MG tablet Take 1-2 tablets (5-10 mg total) by mouth 2 (two) times daily as needed for muscle spasms. 12/03/17   Noe Gens, PA-C  eletriptan (RELPAX) 40 MG tablet Take 1 tablet (40 mg total) by mouth as needed for migraine or headache. May repeat in 2 hours if headache persists or recurs. Patient not taking: Reported on 08/24/2017 05/09/17   Narda Amber K, DO  fluticasone (FLONASE) 50 MCG/ACT nasal spray Place 2 sprays into both nostrils daily. Patient not taking: Reported on 08/24/2017 02/13/16   Wardell Honour,  MD  losartan-hydrochlorothiazide (HYZAAR) 50-12.5 MG tablet TAKE 1 TABLET BY MOUTH EVERY DAY 09/01/17   Renato Shin, MD  lovastatin (MEVACOR) 40 MG tablet TAKE 1 TABLET BY MOUTH EVERYDAY AT BEDTIME 08/05/17   Renato Shin, MD  ondansetron (ZOFRAN) 4 MG tablet Take 1 tablet (4 mg total) by mouth every 8 (eight) hours as needed for nausea or vomiting. 05/09/17   Narda Amber K, DO  pioglitazone (ACTOS) 15 MG tablet Take 1 tablet (15 mg total) by mouth daily. 07/01/17   Renato Shin, MD  pregabalin (LYRICA) 50 MG capsule Lyrica 50 mg capsule  Take 1 capsule 3 times a  day by oral route as needed.    [provider]  sildenafil (REVATIO) 20 MG tablet 3-5 pills as needed for ED symptoms 08/11/16   Renato Shin, MD    Family History Family History  Problem Relation Age of Onset  . Heart attack Mother        MI, deceased 82  . Seizures Mother   . Emphysema Father        Deceased, 48  . Healthy Daughter        x2  . Seizures Grandchild   . Cancer Neg Hx        no cancer in immediate family  . Colon cancer Neg Hx   . Esophageal cancer Neg Hx   . Rectal cancer Neg Hx   . Stomach cancer Neg Hx   . Colon polyps Neg Hx     Social History Social History   Tobacco Use  . Smoking status: Light Tobacco Smoker    Types: Cigars  . Smokeless tobacco: Never Used  . Tobacco comment: smokes maybe 5 cigars a year  Substance Use Topics  . Alcohol use: Yes    Alcohol/week: 0.0 standard drinks    Comment: Rarely  . Drug use: No     Allergies   Patient has no known allergies.   Review of Systems Review of Systems  Musculoskeletal: Positive for myalgias. Negative for arthralgias and gait problem.  Skin: Negative for wound.  Neurological: Negative for weakness and numbness.     Physical Exam Triage Vital Signs ED Triage Vitals  Enc Vitals Group     BP      Pulse      Resp      Temp      Temp src      SpO2      Weight      Height      Head Circumference      Peak Flow      Pain Score      Pain Loc      Pain Edu?      Excl. in [place]?    No data found.  Updated Vital Signs BP 133/87 (BP Location: Right Arm)   Pulse 88   Temp 98.4 F (36.9 C) (Oral)   Ht 5\' 8"  (1.727 m)   Wt 239 lb (108.4 kg)   SpO2 99%   BMI 36.34 kg/m   Visual Acuity Right Eye Distance:   Left Eye Distance:   Bilateral Distance:    Right Eye Near:   Left Eye Near:    Bilateral Near:     Physical Exam  Constitutional: He is oriented to person, place, and time. He appears well-developed and well-nourished.  HENT:  Head: Normocephalic and  atraumatic.  Eyes: EOM are normal.  Neck: Normal range of motion. Neck supple.  No midline bone  tenderness, no crepitus or step-offs.   Cardiovascular: Normal rate.  Pulmonary/Chest: Effort normal.  Musculoskeletal: Normal range of motion. He exhibits tenderness.  Tenderness to lower thoracic spine and upper lumbar spine, tenderness over sacrum. And surrounding lumbar muscles. Negative straight leg raise. Normal gait.   Neurological: He is alert and oriented to person, place, and time.  Skin: Skin is warm and dry. No erythema.  Psychiatric: He has a normal mood and affect. His behavior is normal.  Nursing note and vitals reviewed.    UC Treatments / Results  Labs (all labs ordered are listed, but only abnormal results are displayed) Labs Reviewed - No data to display  EKG None  Radiology Dg Thoracic Spine W/swimmers  Result Date: 12/03/2017 CLINICAL DATA:  [place] while getting out of shower yesterday. Left-sided back pain. Initial encounter. EXAM: THORACIC SPINE - 3 VIEWS COMPARISON:  04/14/2017 FINDINGS: There is no evidence of thoracic spine fracture. Alignment is normal. Mild vertebral osteophyte formation is seen in the mid and lower thoracic spine. Thoracic intervertebral disc spaces are maintained. Mild-to-moderate degenerative disc disease seen in the lower cervical spine from levels of C4-C7. IMPRESSION: No acute findings. Lower cervical and thoracic spine degenerative disc disease. Electronically Signed   By: Earle Gell M.D.   On: 12/03/2017 11:33   Dg Lumbar Spine Complete  Result Date: 12/03/2017 CLINICAL DATA:  Fall while getting out of shower yesterday. Low back pain. Initial encounter. EXAM: LUMBAR SPINE - COMPLETE 4+ VIEW COMPARISON:  None. FINDINGS: There is no evidence of lumbar spine fracture. Alignment is normal. Mild degenerative vertebral osteophyte formation noted at most lumbar levels. Intervertebral disc spaces are maintained. Moderate facet DJD seen bilaterally  at L5-S1. Aortic atherosclerosis. IMPRESSION: No acute findings. Degenerative spondylosis, as described above. Electronically Signed   By: Earle Gell M.D.   On: 12/03/2017 11:35   Dg Sacrum/coccyx  Result Date: 12/03/2017 CLINICAL DATA:  Fall while getting out of shower yesterday. Sacrococcygeal pain. Initial encounter. EXAM: SACRUM AND COCCYX - 2+ VIEW COMPARISON:  None. FINDINGS: There is no evidence of fracture or other focal bone lesions. IMPRESSION: Negative. Electronically Signed   By: Earle Gell M.D.   On: 12/03/2017 11:36    Procedures Procedures (including critical care time)  Medications Ordered in UC Medications - No data to display  Initial Impression / Assessment and Plan / UC Course  I have reviewed the triage vital signs and the nursing notes.  Pertinent labs & imaging results that were available during my care of the patient were reviewed by me and considered in my medical decision making (see chart for details).    Discussed imaging with pt. No acute findings. There is some degenerative changes.  Hx and exam c/w muscle strain. Encouraged continued cool and warm compresses and NSAIDs Will have pt take Flexeril as needed. Pt to return to work Monday 12/05/17. If not improving, encouraged to f/u with Employee/Occupational Health at [facility].   Final Clinical Impressions(s) / UC Diagnoses   Final diagnoses:  Mid back pain  Strain of mid-back, initial encounter  Acute bilateral low back pain without sciatica  Tailbone injury, initial encounter  Fall from slip, trip, or stumble, initial encounter     Discharge Instructions      Flexeril (cyclobenzaprine) is a muscle relaxer and may cause drowsiness. Do not drink alcohol, drive, or operate heavy machinery while taking.  You may take 500mg  acetaminophen every 4-6 hours or in combination with ibuprofen 400-600mg  every 6-8 hours as  needed for pain and inflammation.  Please follow up next week with  Occupational Health at [facility] next week if not improving.       ED Prescriptions    Medication Sig Dispense Auth. Provider   cyclobenzaprine (FLEXERIL) 5 MG tablet Take 1-2 tablets (5-10 mg total) by mouth 2 (two) times daily as needed for muscle spasms. 30 tablet Noe Gens, PA-C     Controlled Substance Prescriptions [place] Controlled Substance Registry consulted? Not Applicable   Tyrell Antonio 12/03/17 1212

## 2017-12-03 NOTE — Discharge Instructions (Signed)
°  Flexeril (cyclobenzaprine) is a muscle relaxer and may cause drowsiness. Do not drink alcohol, drive, or operate heavy machinery while taking.  You may take 500mg  acetaminophen every 4-6 hours or in combination with ibuprofen 400-600mg  every 6-8 hours as needed for pain and inflammation.  Please follow up next week with Occupational Health at [facility] next week if not improving.

## 2018-01-24 ENCOUNTER — Other Ambulatory Visit: Payer: Self-pay | Admitting: Endocrinology

## 2018-02-21 ENCOUNTER — Emergency Department (INDEPENDENT_AMBULATORY_CARE_PROVIDER_SITE_OTHER)
Admission: EM | Admit: 2018-02-21 | Discharge: 2018-02-21 | Disposition: A | Discharge status: Home / Self Care | Payer: BLUE CROSS/BLUE SHIELD | Source: Home / Self Care

## 2018-02-21 ENCOUNTER — Other Ambulatory Visit: Payer: Self-pay

## 2018-02-21 ENCOUNTER — Encounter: Payer: Self-pay | Admitting: Family Medicine

## 2018-02-21 DIAGNOSIS — J40 Bronchitis, not specified as acute or chronic: Secondary | ICD-10-CM | POA: Diagnosis not present

## 2018-02-21 MED ORDER — HYDROCOD POLST-CPM POLST ER 10-8 MG/5ML PO SUER: 5.0000 mL | Freq: Two times a day (BID) | ORAL | 0 refills | Status: DC | PRN

## 2018-02-21 MED ORDER — AZITHROMYCIN 250 MG PO TABS: ORAL_TABLET | ORAL | 0 refills | Status: DC

## 2018-02-21 MED ORDER — PREDNISONE 20 MG PO TABS: ORAL_TABLET | ORAL | 1 refills | Status: DC

## 2018-02-21 NOTE — Discharge Instructions (Addendum)
Expect significant improvement in the next 48 hours.

## 2018-02-21 NOTE — ED Provider Notes (Signed)
[name]    CSN: [id number] Arrival date & time: 02/21/18  1219     History   Chief Complaint Chief Complaint  Patient presents with  . Cough    HPI Ruben West is a 62 y.o. male.   This is 62 year old man who is an established patient at [facility] urgent care complaining about an upper respiratory infection.  He has a history of hypertension, diabetes, headache disorder, Karlene Lineman, hyperlipidemia.  (Multiple redundancies and irrelevant details on his past medical history form and problem list)  Patient is a driver for [employer].  He does not smoke.  He has no history of asthma.  In the remote past, he did have an inhaler.  Patient's symptoms began yesterday with sore throat, congestion, headache, itchy ears, and cough to the point of having some blood-tinged sputum this morning.  He has no shortness of breath or chest pain.  He does not have a fever.     Past Medical History:  Diagnosis Date  . ALLERGIC RHINITIS 11/23/2006  . Back pain   . 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 FATTY LIVER DISEASE 05/09/2008  . GERD 04/04/2007   no per pt  . Headache    Migraines a few times a year 1993  . Heart murmur   . Hypercholesteremia   . HYPERGLYCEMIA 04/04/2007  . HYPERTENSION 11/23/2006  . Migraines   . NASH (nonalcoholic steatohepatitis)   . 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 PUD (peptic ulcer disease)     Patient Active Problem List   Diagnosis Date Noted  . Myalgia 06/29/2017  . Right thyroid nodule 06/13/2017  . Vertigo 04/30/2016  . Degenerative arthritis of knee, bilateral 01/01/2016  . Benign paroxysmal positional vertigo 12/10/2015  . Wellness examination 06/10/2015  . Screening for prostate cancer 06/10/2015  . Allergic rhinitis 06/10/2015  . Skin nodule 06/10/2015  . Screening examination for infectious disease 03/04/2014  . Obesity (BMI 30-39.9) 01/13/2014  . Numbness 01/12/2014  . Migraine with aura 01/12/2014    . Atypical chest pain 01/12/2014  . Hyperlipidemia 01/12/2014  . Migraine, unspecified, without mention of intractable migraine without mention of status migrainosus 06/30/2012  . Depressive disorder, not elsewhere classified 05/22/2012  . Chronic meniscal tear of knee 12/22/2011  . FATTY LIVER DISEASE 05/09/2008  . OTHER TESTICULAR HYPOFUNCTION 05/19/2007  . GERD 04/04/2007  . 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 HYPERGLYCEMIA 04/04/2007  . Essential hypertension 11/23/2006  . ALLERGIC RHINITIS 11/23/2006    Past Surgical History:  Procedure Laterality Date  . COLONOSCOPY    . ELECTROCARDIOGRAM  02/21/2006  . KNEE SURGERY Left    2015  . VASECTOMY         Home Medications    Prior to Admission medications   Medication Sig Start Date End Date Taking? Authorizing Provider  aspirin EC 81 MG tablet Take 81 mg by mouth every morning.    [provider]  azithromycin (ZITHROMAX) 250 MG tablet Take 2 tabs PO x 1 dose, then 1 tab PO QD x 4 days 02/21/18   Robyn Haber, MD  chlorpheniramine-HYDROcodone [facility] PENNKINETIC ER) 10-8 MG/5ML SUER Take 5 mLs by mouth every 12 (twelve) hours as needed for cough. 02/21/18   Robyn Haber, MD  cyclobenzaprine (FLEXERIL) 5 MG tablet Take 1-2 tablets (5-10 mg total) by mouth 2 (two) times daily as needed for muscle spasms. 12/03/17   Noe Gens, PA-C  losartan-hydrochlorothiazide (HYZAAR) 50-12.5 MG tablet TAKE 1 TABLET BY MOUTH EVERY  DAY 09/01/17   Renato Shin, MD  lovastatin (MEVACOR) 40 MG tablet TAKE 1 TABLET BY MOUTH EVERYDAY AT BEDTIME 01/24/18   Renato Shin, MD  pioglitazone (ACTOS) 15 MG tablet Take 1 tablet (15 mg total) by mouth daily. 07/01/17   Renato Shin, MD  predniSONE (DELTASONE) 20 MG tablet 2 daily with food 02/21/18   Robyn Haber, MD  pregabalin (LYRICA) 50 MG capsule Lyrica 50 mg capsule  Take 1 capsule 3 times a day by oral route as needed.    [provider]  sildenafil  (REVATIO) 20 MG tablet 3-5 pills as needed for ED symptoms 08/11/16   Renato Shin, MD    Family History Family History  Problem Relation Age of Onset  . Heart attack Mother        MI, deceased 60  . Seizures Mother   . Emphysema Father        Deceased, 37  . Healthy Daughter        x2  . Seizures Grandchild   . Cancer Neg Hx        no cancer in immediate family  . Colon cancer Neg Hx   . Esophageal cancer Neg Hx   . Rectal cancer Neg Hx   . Stomach cancer Neg Hx   . Colon polyps Neg Hx     Social History Social History   Tobacco Use  . Smoking status: Light Tobacco Smoker    Types: Cigars  . Smokeless tobacco: Never Used  . Tobacco comment: smokes maybe 5 cigars a year  Substance Use Topics  . Alcohol use: Yes    Alcohol/week: 0.0 standard drinks    Comment: Rarely  . Drug use: No     Allergies   Gabapentin   Review of Systems Review of Systems  Constitutional: Negative.  Negative for fever.  HENT: Positive for congestion, sneezing and sore throat.   Respiratory: Positive for cough and wheezing.   Gastrointestinal: Negative.   Neurological: Positive for headaches.  All other systems reviewed and are negative.    Physical Exam Triage Vital Signs ED Triage Vitals  Enc Vitals Group     BP      Pulse      Resp      Temp      Temp src      SpO2      Weight      Height      Head Circumference      Peak Flow      Pain Score      Pain Loc      Pain Edu?      Excl. in [place]?    No data found.  Updated Vital Signs BP 133/85 (BP Location: Right Arm)   Pulse 76   Temp 97.6 F (36.4 C) (Oral)   Resp 20   Ht 5\' 8"  (1.727 m)   Wt 111.6 kg   SpO2 96%   BMI 37.40 kg/m    Physical Exam  Constitutional: He is oriented to person, place, and time. He appears well-developed and well-nourished.  HENT:  Right Ear: External ear normal.  Left Ear: External ear normal.  Mouth/Throat: Oropharynx is clear and moist.  TMs show serous fluid meniscus    Eyes: Pupils are equal, round, and reactive to light. Conjunctivae are normal.  Neck: Normal range of motion. Neck supple.  Cardiovascular: Normal rate, regular rhythm and normal heart sounds.  Pulmonary/Chest: Effort normal. He has wheezes.  Musculoskeletal: Normal  range of motion.  Neurological: He is alert and oriented to person, place, and time.  Skin: Skin is warm and dry.  Nursing note and vitals reviewed.    UC Treatments / Results  Labs (all labs ordered are listed, but only abnormal results are displayed) Labs Reviewed - No data to display  EKG None  Radiology No results found.  Procedures Procedures (including critical care time)  Medications Ordered in UC Medications - No data to display  Initial Impression / Assessment and Plan / UC Course  I have reviewed the triage vital signs and the nursing notes.  Pertinent labs & imaging results that were available during my care of the patient were reviewed by me and considered in my medical decision making (see chart for details).    Final Clinical Impressions(s) / UC Diagnoses   Final diagnoses:  Bronchitis     Discharge Instructions     Expect significant improvement in the next 48 hours.    ED Prescriptions    Medication Sig Dispense Auth. Provider   chlorpheniramine-HYDROcodone (TUSSIONEX PENNKINETIC ER) 10-8 MG/5ML SUER Take 5 mLs by mouth every 12 (twelve) hours as needed for cough. 100 mL Robyn Haber, MD   azithromycin (ZITHROMAX) 250 MG tablet Take 2 tabs PO x 1 dose, then 1 tab PO QD x 4 days 6 tablet Robyn Haber, MD   predniSONE (DELTASONE) 20 MG tablet 2 daily with food 10 tablet Robyn Haber, MD     Controlled Substance Prescriptions [place] Controlled Substance Registry consulted? Not Applicable   Robyn Haber, MD 02/21/18 1247

## 2018-02-21 NOTE — ED Triage Notes (Signed)
Started yesterday with cough, sore throat, coughing up phlegm yesterday.  Last night, coughing, ear fullness, chills

## 2018-03-10 ENCOUNTER — Other Ambulatory Visit: Payer: Self-pay | Admitting: Endocrinology

## 2018-03-14 ENCOUNTER — Other Ambulatory Visit: Payer: Self-pay | Admitting: Endocrinology

## 2018-05-18 ENCOUNTER — Telehealth: Payer: Self-pay | Admitting: Internal Medicine

## 2018-05-18 NOTE — Telephone Encounter (Signed)
Please advise 

## 2018-05-18 NOTE — Telephone Encounter (Signed)
Ok with me 

## 2018-05-18 NOTE — Telephone Encounter (Signed)
Copied from [place] [id number]. Topic: General - Inquiry >> May 18, 2018 12:06 PM Jeri Cos wrote:  Reason for CRM: Pt's spouse is wanting husband to establish care with Dr. Jenny Reichmann as a new pt. Pt stated that she had already spoken with Dr. Jenny Reichmann and he was willing to accept her husband as a new pt.

## 2018-05-23 NOTE — Telephone Encounter (Signed)
Patient scheduled.

## 2018-05-24 ENCOUNTER — Encounter: Payer: Self-pay | Admitting: Emergency Medicine

## 2018-05-24 ENCOUNTER — Other Ambulatory Visit: Payer: Self-pay

## 2018-05-24 ENCOUNTER — Emergency Department (INDEPENDENT_AMBULATORY_CARE_PROVIDER_SITE_OTHER)
Admission: EM | Admit: 2018-05-24 | Discharge: 2018-05-24 | Disposition: A | Discharge status: Home / Self Care | Payer: BLUE CROSS/BLUE SHIELD | Source: Home / Self Care | Attending: Emergency Medicine | Admitting: Emergency Medicine

## 2018-05-24 DIAGNOSIS — J209 Acute bronchitis, unspecified: Secondary | ICD-10-CM | POA: Diagnosis not present

## 2018-05-24 MED ORDER — AZITHROMYCIN 250 MG PO TABS: ORAL_TABLET | ORAL | 0 refills | Status: DC

## 2018-05-24 MED ORDER — HYDROCOD POLST-CPM POLST ER 10-8 MG/5ML PO SUER: 5.0000 mL | Freq: Every evening | ORAL | 0 refills | Status: DC | PRN

## 2018-05-24 NOTE — ED Triage Notes (Signed)
Cough, chills, sinus headache x 1 week

## 2018-05-24 NOTE — ED Provider Notes (Signed)
[name]    CSN: [id number] Arrival date & time: 05/24/18  0904     History   Chief Complaint Chief Complaint  Patient presents with  . Cough    HPI Ruben West is a 63 y.o. male.   HPI  URI HISTORY  Ruben West is a 63 y.o. male who complains of onset of URI prodrome, progressing to chest congestion and sinus congestion for 8 days.  [facility] was prescribed prednisone, that did not seem to help significantly and made him more anxious so he has now finished the prednisone course.    Was treated for bronchitis in November with antibiotic and strong cough med and he request those today.  No chills/sweats + Low-grade fever  +  Nasal congestion No discolored Post-nasal drainage No sinus pain/pressure No sore throat  +  cough No wheezing Positive, chest congestion No hemoptysis No shortness of breath No pleuritic pain  No itchy/red eyes No earache  No nausea No vomiting No abdominal pain No diarrhea  No skin rashes +  Fatigue No myalgias or arthralgias. No headache   Past Medical History:  Diagnosis Date  . ALLERGIC RHINITIS 11/23/2006  . Back pain   . 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 FATTY LIVER DISEASE 05/09/2008  . GERD 04/04/2007   no per pt  . Headache    Migraines a few times a year 1993  . Heart murmur   . Hypercholesteremia   . HYPERGLYCEMIA 04/04/2007  . HYPERTENSION 11/23/2006  . Migraines   . NASH (nonalcoholic steatohepatitis)   . 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 PUD (peptic ulcer disease)     Patient Active Problem List   Diagnosis Date Noted  . Myalgia 06/29/2017  . Right thyroid nodule 06/13/2017  . Vertigo 04/30/2016  . Degenerative arthritis of knee, bilateral 01/01/2016  . Benign paroxysmal positional vertigo 12/10/2015  . Wellness examination 06/10/2015  . Screening for prostate cancer 06/10/2015  . Allergic rhinitis 06/10/2015  . Skin nodule 06/10/2015  .  Screening examination for infectious disease 03/04/2014  . Obesity (BMI 30-39.9) 01/13/2014  . Numbness 01/12/2014  . Migraine with aura 01/12/2014  . Atypical chest pain 01/12/2014  . Hyperlipidemia 01/12/2014  . Migraine, unspecified, without mention of intractable migraine without mention of status migrainosus 06/30/2012  . Depressive disorder, not elsewhere classified 05/22/2012  . Chronic meniscal tear of knee 12/22/2011  . FATTY LIVER DISEASE 05/09/2008  . OTHER TESTICULAR HYPOFUNCTION 05/19/2007  . GERD 04/04/2007  . 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 HYPERGLYCEMIA 04/04/2007  . Essential hypertension 11/23/2006  . ALLERGIC RHINITIS 11/23/2006    Past Surgical History:  Procedure Laterality Date  . COLONOSCOPY    . ELECTROCARDIOGRAM  02/21/2006  . KNEE SURGERY Left    2015  . VASECTOMY         Home Medications    Prior to Admission medications   Medication Sig Start Date End Date Taking? Authorizing Provider  aspirin EC 81 MG tablet Take 81 mg by mouth every morning.    [provider]  azithromycin (ZITHROMAX Z-PAK) 250 MG tablet Take 2 tablets on day one, then 1 tablet daily on days 2 through 5 05/24/18   Jacqulyn Cane, MD  chlorpheniramine-HYDROcodone [facility]) 10-8 MG/5ML SUER Take 5 mLs by mouth at bedtime as needed. For severe cough.  Caution: May cause drowsiness 05/24/18   Jacqulyn Cane, MD  cyclobenzaprine (FLEXERIL) 5 MG tablet Take 1-2  tablets (5-10 mg total) by mouth 2 (two) times daily as needed for muscle spasms. 12/03/17   Noe Gens, PA-C  losartan-hydrochlorothiazide (HYZAAR) 50-12.5 MG tablet TAKE 1 TABLET BY MOUTH EVERY DAY 03/10/18   Renato Shin, MD  lovastatin (MEVACOR) 40 MG tablet TAKE 1 TABLET BY MOUTH EVERYDAY AT BEDTIME 03/14/18   Renato Shin, MD  pioglitazone (ACTOS) 15 MG tablet Take 1 tablet (15 mg total) by mouth daily. 07/01/17   Renato Shin, MD  predniSONE (DELTASONE) 20 MG tablet 2 daily with food  02/21/18   Robyn Haber, MD    Family History Family History  Problem Relation Age of Onset  . Heart attack Mother        MI, deceased 98  . Seizures Mother   . Emphysema Father        Deceased, 32  . Healthy Daughter        x2  . Seizures Grandchild   . Cancer Neg Hx        no cancer in immediate family  . Colon cancer Neg Hx   . Esophageal cancer Neg Hx   . Rectal cancer Neg Hx   . Stomach cancer Neg Hx   . Colon polyps Neg Hx     Social History Social History   Tobacco Use  . Smoking status: Light Tobacco Smoker    Types: Cigars  . Smokeless tobacco: Never Used  . Tobacco comment: smokes maybe 5 cigars a year  Substance Use Topics  . Alcohol use: Yes    Alcohol/week: 0.0 standard drinks    Comment: Rarely  . Drug use: No     Allergies   Gabapentin   Review of Systems Review of Systems  All other systems reviewed and are negative.  Pertinent items noted in HPI and remainder of comprehensive ROS otherwise negative.   Physical Exam Triage Vital Signs ED Triage Vitals  Enc Vitals Group     BP 05/24/18 0929 (!) 156/89     Pulse Rate 05/24/18 0929 80     Resp --      Temp 05/24/18 0929 98 F (36.7 C)     Temp Source 05/24/18 0929 Oral     SpO2 05/24/18 0929 96 %     Weight 05/24/18 0930 246 lb (111.6 kg)     Height 05/24/18 0930 5\' 8"  (1.727 m)     Head Circumference --      Peak Flow --      Pain Score 05/24/18 0929 3     Pain Loc --      Pain Edu? --      Excl. in [place]? --    No data found.  Updated Vital Signs BP (!) 156/89 (BP Location: Right Arm)   Pulse 80   Temp 98 F (36.7 C) (Oral)   Ht 5\' 8"  (1.727 m)   Wt 111.6 kg   SpO2 96%   BMI 37.40 kg/m   BP rechecked 142/87   Physical Exam Vitals signs and nursing note reviewed.  Constitutional:      General: He is not in acute distress.    Appearance: He is well-developed.  HENT:     Head: Normocephalic and atraumatic.     Right Ear: Tympanic membrane normal.     Left Ear:  Tympanic membrane normal.     Nose: Nose normal.     Mouth/Throat:     Pharynx: No oropharyngeal exudate.  Eyes:     General: No scleral icterus.  Right eye: No discharge.        Left eye: No discharge.  Neck:     Musculoskeletal: Neck supple.  Cardiovascular:     Rate and Rhythm: Normal rate and regular rhythm.     Heart sounds: Normal heart sounds.  Pulmonary:     Effort: No respiratory distress.     Breath sounds: Rhonchi present. No wheezing or rales.  Lymphadenopathy:     Cervical: No cervical adenopathy.  Skin:    General: Skin is warm and dry.  Neurological:     Mental Status: He is alert and oriented to person, place, and time.      UC Treatments / Results  Labs (all labs ordered are listed, but only abnormal results are displayed) Labs Reviewed - No data to display  EKG None  Radiology No results found.  Procedures Procedures (including critical care time)  Medications Ordered in UC Medications - No data to display  Initial Impression / Assessment and Plan / UC Course  I have reviewed the triage vital signs and the nursing notes.  Pertinent labs & imaging results that were available during my care of the patient were reviewed by me and considered in my medical decision making (see chart for details).      Final Clinical Impressions(s) / UC Diagnoses   Final diagnoses:  Acute bronchitis, unspecified organism  Treatment options discussed, as well as risks, benefits, alternatives. Patient voiced understanding and agreement with the following plans:    Discharge Instructions     You have bronchitis.  Please read attached instruction sheet on bronchitis. I have sent 2 prescriptions to your pharmacy, one for antibiotic called Zithromax Z-Pak, and the other 4 strong cough medicine, 5 mL's by mouth at bedtime for severe cough. Do not drive while taking cough medicine. Note written to excuse from work, may return to regular work 05/27/2018. Be  sure to follow-up with your physician if no better in 1 week, sooner if worse or new symptoms.   ED Prescriptions    Medication Sig Dispense Auth. Provider   chlorpheniramine-HYDROcodone (TUSSIONEX PENNKINETIC ER) 10-8 MG/5ML SUER Take 5 mLs by mouth at bedtime as needed. For severe cough.  Caution: May cause drowsiness 25 mL Jacqulyn Cane, MD   azithromycin (ZITHROMAX Z-PAK) 250 MG tablet Take 2 tablets on day one, then 1 tablet daily on days 2 through 5 1 each Jacqulyn Cane, MD     Controlled Substance Prescriptions [place] Controlled Substance Registry consulted? Yes, I have consulted the [place] Controlled Substances Registry for this patient, and feel the risk/benefit ratio today is favorable for proceeding with this prescription for a controlled substance.   Jacqulyn Cane, MD 05/24/18 [phone]

## 2018-05-24 NOTE — Discharge Instructions (Addendum)
You have bronchitis.  Please read attached instruction sheet on bronchitis. I have sent 2 prescriptions to your pharmacy, one for antibiotic called Zithromax Z-Pak, and the other 4 strong cough medicine, 5 mL's by mouth at bedtime for severe cough. Do not drive while taking cough medicine. Note written to excuse from work, may return to regular work 05/24/2018. Be sure to follow-up with your physician if no better in 1 week, sooner if worse or new symptoms.

## 2018-05-25 ENCOUNTER — Other Ambulatory Visit: Payer: Self-pay

## 2018-05-25 ENCOUNTER — Telehealth: Payer: Self-pay | Admitting: Endocrinology

## 2018-05-25 DIAGNOSIS — R7309 Other abnormal glucose: Secondary | ICD-10-CM

## 2018-05-25 MED ORDER — LOSARTAN POTASSIUM-HCTZ 50-12.5 MG PO TABS: 1.0000 | ORAL_TABLET | Freq: Every day | ORAL | 0 refills | Status: DC

## 2018-05-25 MED ORDER — PIOGLITAZONE HCL 15 MG PO TABS: 15.0000 mg | ORAL_TABLET | Freq: Every day | ORAL | 3 refills | Status: DC

## 2018-05-25 MED ORDER — LOVASTATIN 40 MG PO TABS: ORAL_TABLET | ORAL | 0 refills | Status: DC

## 2018-05-25 NOTE — Telephone Encounter (Signed)
MEDICATION: Lovastatin, Losartan-HCTZ, Pioglitazone  PHARMACY:  [facility] on [address]  IS THIS A 90 DAY SUPPLY : no  IS PATIENT OUT OF MEDICATION: almost  IF NOT; HOW MUCH IS LEFT: 1-2 days  LAST APPOINTMENT DATE: @12 /17/2019  NEXT APPOINTMENT DATE:@Visit  date not found  DO WE HAVE YOUR PERMISSION TO LEAVE A DETAILED MESSAGE:  OTHER COMMENTS: Patient has an appointment with new primary care Dr Ruben West on 06/12/2018, but needs the above 3 med's refilled until he can get in to his new PCP appointment and establish care and refills  Please call to advise refill status at [phone]   **Let patient know to contact pharmacy at the end of the day to make sure medication is ready. **  ** Please notify patient to allow 48-72 hours to process**  **Encourage patient to contact the pharmacy for refills or they can request refills through [facility]**

## 2018-05-25 NOTE — Telephone Encounter (Signed)
pioglitazone (ACTOS) 15 MG tablet 90 tablet 3 05/25/2018    Sig - Route: Take 1 tablet (15 mg total) by mouth daily. - Oral   Sent to pharmacy as: pioglitazone (ACTOS) 15 MG tablet   E-Prescribing Status: Receipt confirmed by pharmacy (05/25/2018 1:46 PM EST)    Unable to refill Lovastatin and Losartan. Pt will need to request refills from PCP.

## 2018-06-12 ENCOUNTER — Encounter: Payer: Self-pay | Admitting: Internal Medicine

## 2018-06-12 ENCOUNTER — Other Ambulatory Visit: Payer: Self-pay

## 2018-06-12 ENCOUNTER — Ambulatory Visit (INDEPENDENT_AMBULATORY_CARE_PROVIDER_SITE_OTHER): Payer: BLUE CROSS/BLUE SHIELD | Admitting: Internal Medicine

## 2018-06-12 VITALS — BP 122/74 | HR 83 | Temp 97.9°F | Ht 68.0 in | Wt 246.0 lb

## 2018-06-12 DIAGNOSIS — Z0000 Encounter for general adult medical examination without abnormal findings: Secondary | ICD-10-CM

## 2018-06-12 DIAGNOSIS — F329 Major depressive disorder, single episode, unspecified: Secondary | ICD-10-CM | POA: Diagnosis not present

## 2018-06-12 DIAGNOSIS — I10 Essential (primary) hypertension: Secondary | ICD-10-CM | POA: Diagnosis not present

## 2018-06-12 DIAGNOSIS — F32A Depression, unspecified: Secondary | ICD-10-CM

## 2018-06-12 DIAGNOSIS — N529 Male erectile dysfunction, unspecified: Secondary | ICD-10-CM

## 2018-06-12 DIAGNOSIS — R7309 Other abnormal glucose: Secondary | ICD-10-CM | POA: Diagnosis not present

## 2018-06-12 MED ORDER — TADALAFIL 20 MG PO TABS: 20.0000 mg | ORAL_TABLET | Freq: Every day | ORAL | 11 refills | Status: DC | PRN

## 2018-06-12 MED ORDER — LOVASTATIN 40 MG PO TABS: ORAL_TABLET | ORAL | 3 refills | Status: DC

## 2018-06-12 NOTE — Patient Instructions (Signed)
Please take all new medication as prescribed - the cialis as needed  Please continue all other medications as before, and refills have been done if requested.  Please have the pharmacy call with any other refills you may need.  Please continue your efforts at being more active, low cholesterol diet, and weight control.  Please keep your appointments with your specialists as you may have planned  Please return in 6 months, or sooner if needed, with Lab testing done 3-5 days before
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
## 2018-06-12 NOTE — Assessment & Plan Note (Signed)
stable overall by history and exam, recent data reviewed with pt, and pt to continue medical treatment as before,  to f/u any worsening symptoms or concerns  

## 2018-06-12 NOTE — Assessment & Plan Note (Signed)
Ok for change viagra to cialis prn,  to f/u any worsening symptoms or concerns 

## 2018-06-14 ENCOUNTER — Telehealth: Payer: Self-pay

## 2018-06-14 NOTE — Telephone Encounter (Signed)
Key: [id number]

## 2018-07-16 ENCOUNTER — Other Ambulatory Visit: Payer: Self-pay | Admitting: Neurology

## 2018-07-19 ENCOUNTER — Telehealth: Payer: Self-pay | Admitting: Neurology

## 2018-07-19 NOTE — Telephone Encounter (Signed)
Patient calling needing to get a refill on his Migraine medication. He lmom and did not specify the pharmacy or the supply amount. Thanks

## 2018-07-19 NOTE — Telephone Encounter (Signed)
He needs an appointment

## 2018-07-19 NOTE — Telephone Encounter (Signed)
Evisit

## 2018-07-19 NOTE — Telephone Encounter (Signed)
Ok e visit or in office?

## 2018-07-25 ENCOUNTER — Encounter: Payer: Self-pay | Admitting: Neurology

## 2018-07-25 ENCOUNTER — Telehealth (INDEPENDENT_AMBULATORY_CARE_PROVIDER_SITE_OTHER): Payer: BLUE CROSS/BLUE SHIELD | Admitting: Neurology

## 2018-07-25 ENCOUNTER — Encounter: Payer: Self-pay | Admitting: *Deleted

## 2018-07-25 ENCOUNTER — Other Ambulatory Visit: Payer: Self-pay

## 2018-07-25 VITALS — Ht 68.5 in | Wt 242.0 lb

## 2018-07-25 DIAGNOSIS — G43109 Migraine with aura, not intractable, without status migrainosus: Secondary | ICD-10-CM

## 2018-07-25 MED ORDER — ELETRIPTAN HYDROBROMIDE 40 MG PO TABS: 40.0000 mg | ORAL_TABLET | ORAL | 11 refills | Status: DC | PRN

## 2018-07-25 NOTE — Progress Notes (Signed)
   Virtual Visit via Video Note The purpose of this virtual visit is to provide medical care while limiting exposure to the novel coronavirus.    Consent was obtained for video visit:  Yes.   Answered questions that patient had about telehealth interaction:  Yes.   I discussed the limitations, risks, security and privacy concerns of performing an evaluation and management service by telemedicine. I also discussed with the patient that there may be a patient responsible charge related to this service. The patient expressed understanding and agreed to proceed.  Pt location: Work Physician Location: office Name of referring provider:  Biagio Borg, MD I connected with Ruben West at patients initiation/request on 07/25/2018 at  9:00 AM EDT by video enabled telemedicine application and verified that I am speaking with the correct person using two identifiers. Pt MRN:  [id number] Pt DOB:  [birth year] Video Participants:  Ruben West   History of Present Illness: This is a 64 y.o. male returning for follow-up of migraine and vertigo.  He was getting migraines once every 2-3 months in late 2019, however starting 2020, migraines frequency has increased to 1-2 times per month, which he attributes to life stressors.   It is very responsive to Relpax within 15-30 minutes.  He does not have any side effects to the medication and is requesting refill.    Vertigo is doing much better and he has not had any further spells since 2019.     Observations/Objective:   Patient is awake, alert, and appears comfortable.  Oriented x 4.   Extraocular muscles are intact. No ptosis.  Face is symmetric.  Speech is not dysarthric. Tongue is midline. Antigravity in all extremities.  No pronator drift. Gait appears normal.   Assessment and Plan:  1. Episodic migraine without aura, increased frequency of migraines however not transitioning into chronic migraine, so will continue to manage with abortive therapy.  - Continue Relpax 40mg  for severe migraine. Limit to twice per week.  Refills provided for 1 year.   2.  BPPV, resolved. OK to take meclizine as needed.    Follow Up Instructions:   I discussed the assessment and treatment plan with the patient. The patient was provided an opportunity to ask questions and all were answered. The patient agreed with the plan and demonstrated an understanding of the instructions.   The patient was advised to call back or seek an in-person evaluation if the symptoms worsen or if the condition fails to improve as anticipated.  Follow-up in 1 year   Alda Berthold, DO

## 2018-07-31 ENCOUNTER — Encounter: Payer: Self-pay | Admitting: Endocrinology

## 2018-08-25 ENCOUNTER — Other Ambulatory Visit: Payer: Self-pay | Admitting: Endocrinology

## 2018-08-25 NOTE — Telephone Encounter (Signed)
Please refill if appropriate

## 2018-08-25 NOTE — Telephone Encounter (Signed)
Done erx 

## 2018-08-25 NOTE — Telephone Encounter (Signed)
Please forward refill request to pt's new primary care provider.  

## 2018-09-05 ENCOUNTER — Telehealth: Payer: Self-pay | Admitting: Neurology

## 2018-09-05 MED ORDER — PROCHLORPERAZINE MALEATE 5 MG PO TABS: 5.0000 mg | ORAL_TABLET | Freq: Four times a day (QID) | ORAL | 1 refills | Status: DC | PRN

## 2018-09-05 NOTE — Telephone Encounter (Signed)
Informed patient compazine was sent to pharmacy.  Patient is still not feeling well.  He is going to try to rest then call tomorrow if not better.

## 2018-09-05 NOTE — Telephone Encounter (Signed)
Please inform pt that prescription for compazine for nausea has been sent.  OK to take relpax as directed.  If headaches get worse, please let us know. Thanks.

## 2018-09-05 NOTE — Telephone Encounter (Signed)
Patient called complaining of nausea, vertigo and headache, he was not sure if he could take relpax.  Advised him to go ahead and take relpax.  He was also asking if he could have a prescription for nausea medicine?  Please advise.

## 2018-09-05 NOTE — Telephone Encounter (Signed)
Pt states that he has been having vertigo with headache, he wants to know if he could be seen today or tomorrow. Pls call him.

## 2018-09-13 ENCOUNTER — Encounter: Payer: Self-pay | Admitting: Internal Medicine

## 2018-09-13 ENCOUNTER — Ambulatory Visit (INDEPENDENT_AMBULATORY_CARE_PROVIDER_SITE_OTHER): Payer: BC Managed Care – PPO | Admitting: Internal Medicine

## 2018-09-13 DIAGNOSIS — J309 Allergic rhinitis, unspecified: Secondary | ICD-10-CM

## 2018-09-13 DIAGNOSIS — J019 Acute sinusitis, unspecified: Secondary | ICD-10-CM | POA: Diagnosis not present

## 2018-09-13 DIAGNOSIS — R7309 Other abnormal glucose: Secondary | ICD-10-CM

## 2018-09-13 MED ORDER — HYDROCODONE-HOMATROPINE 5-1.5 MG/5ML PO SYRP: 5.0000 mL | ORAL_SOLUTION | Freq: Four times a day (QID) | ORAL | 0 refills | Status: AC | PRN

## 2018-09-13 MED ORDER — PREDNISONE 10 MG PO TABS: ORAL_TABLET | ORAL | 0 refills | Status: DC

## 2018-09-13 MED ORDER — AZITHROMYCIN 250 MG PO TABS: ORAL_TABLET | ORAL | 1 refills | Status: DC

## 2018-09-13 NOTE — Patient Instructions (Signed)
Please take all new medication as prescribed - the antibiotic, cough medicine, and prednisone  You can also take Mucinex (or it's generic off brand) for congestion, and tylenol as needed for pain.  Please continue all other medications as before, and refills have been done if requested.  Please have the pharmacy call with any other refills you may need.  Please continue your efforts at being more active, low cholesterol diet, and weight control.  Please keep your appointments with your specialists as you may have planned

## 2018-09-13 NOTE — Assessment & Plan Note (Signed)
/  Mild to mod, for predpac asd,  to f/u any worsening symptoms or concerns 

## 2018-09-13 NOTE — Assessment & Plan Note (Signed)
stable overall by history and exam, recent data reviewed with pt, and pt to continue medical treatment as before,  to f/u any worsening symptoms or concerns  

## 2018-09-13 NOTE — Progress Notes (Signed)
Patient ID: ELMAN DETTMAN, male   DOB: 02-10-1956, 63 y.o.   MRN: [id number]  Virtual Visit via Video Note  I connected with Darletta Moll on 09/13/18 at 11:00 AM EDT by a video enabled telemedicine application and verified that I am speaking with the correct person using two identifiers.  Location: Patient: at home Provider: at office   I discussed the limitations of evaluation and management by telemedicine and the availability of in person appointments. The patient expressed understanding and agreed to proceed.  History of Present Illness:  Here with 2-3 days acute onset fever, facial pain, pressure, headache, general weakness and malaise, and greenish d/c, with mild ST and cough, but pt denies chest pain, wheezing, increased sob or doe, orthopnea, PND, increased LE swelling, palpitations, dizziness or syncope.  Does have several wks ongoing nasal allergy symptoms with clearish congestion, itch and sneezing, without fever, pain, ST, cough, swelling or wheezing.   Pt denies polydipsia, polyuria Past Medical History:  Diagnosis Date  . ALLERGIC RHINITIS 11/23/2006  . Back pain   . 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 FATTY LIVER DISEASE 05/09/2008  . GERD 04/04/2007   no per pt  . Headache    Migraines a few times a year 1993  . Heart murmur   . Hypercholesteremia   . HYPERGLYCEMIA 04/04/2007  . HYPERTENSION 11/23/2006  . Migraines   . NASH (nonalcoholic steatohepatitis)   . 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 PUD (peptic ulcer disease)    Past Surgical History:  Procedure Laterality Date  . COLONOSCOPY    . ELECTROCARDIOGRAM  02/21/2006  . KNEE SURGERY Left    2015  . VASECTOMY      reports that he has been smoking cigars. He has never used smokeless tobacco. He reports current alcohol use. He reports that he does not use drugs. family history includes Emphysema in his father; Healthy in his daughter; Heart attack in his mother; Seizures in his grandchild and  mother. Allergies  Allergen Reactions  . Gabapentin Nausea And Vomiting   Current Outpatient Medications on File Prior to Visit  Medication Sig Dispense Refill  . aspirin EC 81 MG tablet Take 81 mg by mouth every morning.    . eletriptan (RELPAX) 40 MG tablet Take 1 tablet (40 mg total) by mouth as needed for migraine or headache. May repeat in 2 hours if headache persists or recurs. 10 tablet 11  . losartan-hydrochlorothiazide (HYZAAR) 50-12.5 MG tablet TAKE 1 TABLET BY MOUTH EVERY DAY 90 tablet 3  . lovastatin (MEVACOR) 40 MG tablet TAKE 1 TABLET BY MOUTH EVERYDAY AT BEDTIME 90 tablet 3  . pioglitazone (ACTOS) 15 MG tablet Take 1 tablet (15 mg total) by mouth daily. 90 tablet 3  . prochlorperazine (COMPAZINE) 5 MG tablet Take 1 tablet (5 mg total) by mouth every 6 (six) hours as needed for nausea or vomiting. 30 tablet 1  . tadalafil (CIALIS) 20 MG tablet Take 1 tablet (20 mg total) by mouth daily as needed for up to 30 days for erectile dysfunction. 10 tablet 11   No current facility-administered medications on file prior to visit.     Observations/Objective: Alert, NAD, mild ill, appropriate mood and affect, resps normal, cn 2-12 intact, moves all 4s, no visible rash or swelling Lab Results  Component Value Date   WBC 8.0 06/29/2017   HGB 16.4 06/29/2017   HCT 46.6 06/29/2017   PLT 231.0 06/29/2017   GLUCOSE 93 06/29/2017   CHOL 128 06/29/2017  TRIG 239.0 (H) 06/29/2017   HDL 34.80 (L) 06/29/2017   LDLDIRECT 69.0 06/29/2017   LDLCALC 61 06/14/2016   ALT 60 (H) 06/29/2017   AST 40 (H) 06/29/2017   NA 141 06/29/2017   K 4.1 06/29/2017   CL 103 06/29/2017   CREATININE 1.02 06/29/2017   BUN 12 06/29/2017   CO2 31 06/29/2017   TSH 3.41 06/29/2017   PSA 0.76 06/29/2017   HGBA1C 5.2 06/29/2017   Assessment and Plan: See notes  Follow Up Instructions: See notes   I discussed the assessment and treatment plan with the patient. The patient was provided an opportunity to  ask questions and all were answered. The patient agreed with the plan and demonstrated an understanding of the instructions.   The patient was advised to call back or seek an in-person evaluation if the symptoms worsen or if the condition fails to improve as anticipated.  Cathlean Cower, MD

## 2018-09-13 NOTE — Assessment & Plan Note (Signed)
Mild to mod, for antibx course,  to f/u any worsening symptoms or concerns 

## 2018-10-30 ENCOUNTER — Other Ambulatory Visit (INDEPENDENT_AMBULATORY_CARE_PROVIDER_SITE_OTHER): Payer: BC Managed Care – PPO

## 2018-10-30 ENCOUNTER — Encounter: Payer: Self-pay | Admitting: Internal Medicine

## 2018-10-30 ENCOUNTER — Other Ambulatory Visit: Payer: Self-pay

## 2018-10-30 ENCOUNTER — Ambulatory Visit (INDEPENDENT_AMBULATORY_CARE_PROVIDER_SITE_OTHER): Payer: BC Managed Care – PPO | Admitting: Internal Medicine

## 2018-10-30 VITALS — BP 124/86 | HR 64 | Temp 97.7°F | Ht 68.5 in | Wt 246.0 lb

## 2018-10-30 DIAGNOSIS — E538 Deficiency of other specified B group vitamins: Secondary | ICD-10-CM

## 2018-10-30 DIAGNOSIS — Z0000 Encounter for general adult medical examination without abnormal findings: Secondary | ICD-10-CM

## 2018-10-30 DIAGNOSIS — R7309 Other abnormal glucose: Secondary | ICD-10-CM

## 2018-10-30 DIAGNOSIS — E611 Iron deficiency: Secondary | ICD-10-CM | POA: Diagnosis not present

## 2018-10-30 DIAGNOSIS — E559 Vitamin D deficiency, unspecified: Secondary | ICD-10-CM | POA: Diagnosis not present

## 2018-10-30 LAB — URINALYSIS, ROUTINE W REFLEX MICROSCOPIC
Bilirubin Urine: NEGATIVE
Hgb urine dipstick: NEGATIVE
Ketones, ur: NEGATIVE
Leukocytes,Ua: NEGATIVE
Nitrite: NEGATIVE
RBC / HPF: NONE SEEN (ref 0–?)
Specific Gravity, Urine: >=1.03 — AB (ref 1.000–1.030)
Total Protein, Urine: NEGATIVE
Urine Glucose: NEGATIVE
Urobilinogen, UA: 0.2 (ref 0.0–1.0)
pH: 5.5 (ref 5.0–8.0)

## 2018-10-30 LAB — CBC WITH DIFFERENTIAL/PLATELET
Basophils Absolute: 0.1 10*3/uL (ref 0.0–0.1)
Basophils Relative: 1.2 % (ref 0.0–3.0)
Eosinophils Absolute: 0.1 10*3/uL (ref 0.0–0.7)
Eosinophils Relative: 1.4 % (ref 0.0–5.0)
HCT: 44.9 % (ref 39.0–52.0)
Hemoglobin: 15.6 g/dL (ref 13.0–17.0)
Lymphocytes Relative: 36 % (ref 12.0–46.0)
Lymphs Abs: 3 10*3/uL (ref 0.7–4.0)
MCHC: 34.8 g/dL (ref 30.0–36.0)
MCV: 88.4 fl (ref 78.0–100.0)
Monocytes Absolute: 0.8 10*3/uL (ref 0.1–1.0)
Monocytes Relative: 9.4 % (ref 3.0–12.0)
Neutro Abs: 4.3 10*3/uL (ref 1.4–7.7)
Neutrophils Relative %: 52 % (ref 43.0–77.0)
Platelets: 202 10*3/uL (ref 150.0–400.0)
RBC: 5.08 Mil/uL (ref 4.22–5.81)
RDW: 13.5 % (ref 11.5–15.5)
WBC: 8.2 10*3/uL (ref 4.0–10.5)

## 2018-10-30 LAB — IBC PANEL
Iron: 110 ug/dL (ref 42–165)
Saturation Ratios: 30.5 % (ref 20.0–50.0)
Transferrin: 258 mg/dL (ref 212.0–360.0)

## 2018-10-30 LAB — BASIC METABOLIC PANEL
BUN: 16 mg/dL (ref 6–23)
CO2: 28 mEq/L (ref 19–32)
Calcium: 9.7 mg/dL (ref 8.4–10.5)
Chloride: 104 mEq/L (ref 96–112)
Creatinine, Ser: 1.01 mg/dL (ref 0.40–1.50)
GFR: 74.57 mL/min (ref >60.00)
Glucose, Bld: 94 mg/dL (ref 70–99)
Potassium: 4.2 mEq/L (ref 3.5–5.1)
Sodium: 139 mEq/L (ref 135–145)

## 2018-10-30 LAB — HEPATIC FUNCTION PANEL
ALT: 27 U/L (ref 0–53)
AST: 21 U/L (ref 0–37)
Albumin: 4.4 g/dL (ref 3.5–5.2)
Alkaline Phosphatase: 68 U/L (ref 39–117)
Bilirubin, Direct: 0.2 mg/dL (ref 0.0–0.3)
Total Bilirubin: 0.8 mg/dL (ref 0.2–1.2)
Total Protein: 6.7 g/dL (ref 6.0–8.3)

## 2018-10-30 LAB — LIPID PANEL
Cholesterol: 121 mg/dL (ref 0–200)
HDL: 43.4 mg/dL (ref >39.00)
NonHDL: 77.55
Total CHOL/HDL Ratio: 3
Triglycerides: 217 mg/dL — ABNORMAL HIGH (ref 0.0–149.0)
VLDL: 43.4 mg/dL — ABNORMAL HIGH (ref 0.0–40.0)

## 2018-10-30 LAB — VITAMIN B12: Vitamin B-12: 302 pg/mL (ref 211–911)

## 2018-10-30 LAB — HEMOGLOBIN A1C: Hgb A1c MFr Bld: 5.2 % (ref 4.6–6.5)

## 2018-10-30 LAB — LDL CHOLESTEROL, DIRECT: Direct LDL: 59 mg/dL

## 2018-10-30 LAB — VITAMIN D 25 HYDROXY (VIT D DEFICIENCY, FRACTURES): VITD: 23.25 ng/mL — ABNORMAL LOW (ref 30.00–100.00)

## 2018-10-30 LAB — TSH: TSH: 2.93 u[IU]/mL (ref 0.35–4.50)

## 2018-10-30 LAB — PSA: PSA: 0.34 ng/mL (ref 0.10–4.00)

## 2018-10-30 MED ORDER — LOVASTATIN 40 MG PO TABS: ORAL_TABLET | ORAL | 3 refills | Status: DC

## 2018-10-30 MED ORDER — LOSARTAN POTASSIUM-HCTZ 50-12.5 MG PO TABS: 1.0000 | ORAL_TABLET | Freq: Every day | ORAL | 3 refills | Status: DC

## 2018-10-30 NOTE — Progress Notes (Signed)
Subjective:    Patient ID: Ruben West, male    DOB: 1955-12-21, 63 y.o.   MRN: [id number]  HPI  Here for wellness and f/u;  Overall doing ok;  Pt denies Chest pain, worsening SOB, DOE, wheezing, orthopnea, PND, worsening LE edema, palpitations, dizziness or syncope.  Pt denies neurological change such as new headache, facial or extremity weakness.  Pt denies polydipsia, polyuria, or low sugar symptoms. Pt states overall good compliance with treatment and medications, good tolerability, and has been trying to follow appropriate diet.  Pt denies worsening depressive symptoms, suicidal ideation or panic. No fever, night sweats, wt loss, loss of appetite, or other constitutional symptoms.  Pt states good ability with ADL's, has low fall risk, home safety reviewed and adequate, no other significant changes in hearing or vision, and only occasionally active with exercise. Passed recent DOT physical.   Grand daughter with aspoirate pna, siezure, coma last night.  Now on Actos 15 mg per endo for fatty liver.  No new complaints Past Medical History:  Diagnosis Date  . ALLERGIC RHINITIS 11/23/2006  . Back pain   . 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 FATTY LIVER DISEASE 05/09/2008  . GERD 04/04/2007   no per pt  . Headache    Migraines a few times a year 1993  . Heart murmur   . Hypercholesteremia   . HYPERGLYCEMIA 04/04/2007  . HYPERTENSION 11/23/2006  . Migraines   . NASH (nonalcoholic steatohepatitis)   . 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 PUD (peptic ulcer disease)    Past Surgical History:  Procedure Laterality Date  . COLONOSCOPY    . ELECTROCARDIOGRAM  02/21/2006  . KNEE SURGERY Left    2015  . VASECTOMY      reports that he has been smoking cigars. He has never used smokeless tobacco. He reports current alcohol use. He reports that he does not use drugs. family history includes Emphysema in his father; Healthy in his daughter; Heart attack in his mother; Seizures in his  grandchild and mother. Allergies  Allergen Reactions  . Gabapentin Nausea And Vomiting   Current Outpatient Medications on File Prior to Visit  Medication Sig Dispense Refill  . aspirin EC 81 MG tablet Take 81 mg by mouth every morning.    . eletriptan (RELPAX) 40 MG tablet Take 1 tablet (40 mg total) by mouth as needed for migraine or headache. May repeat in 2 hours if headache persists or recurs. 10 tablet 11  . pioglitazone (ACTOS) 15 MG tablet Take 1 tablet (15 mg total) by mouth daily. 90 tablet 3  . prochlorperazine (COMPAZINE) 5 MG tablet Take 1 tablet (5 mg total) by mouth every 6 (six) hours as needed for nausea or vomiting. 30 tablet 1  . tadalafil (CIALIS) 20 MG tablet Take 1 tablet (20 mg total) by mouth daily as needed for up to 30 days for erectile dysfunction. 10 tablet 11   No current facility-administered medications on file prior to visit.    Review of Systems  Constitutional: Negative for other unusual diaphoresis or sweats HENT: Negative for ear discharge or swelling Eyes: Negative for other worsening visual disturbances Respiratory: Negative for stridor or other swelling  Gastrointestinal: Negative for worsening distension or other blood Genitourinary: Negative for retention or other urinary change Musculoskeletal: Negative for other MSK pain or swelling Skin: Negative for color change or other new lesions Neurological: Negative for worsening tremors and other numbness  Psychiatric/Behavioral: Negative for worsening agitation or other fatigue  All other system neg per pt    Objective:   Physical Exam BP 124/86   Pulse 64   Temp 97.7 F (36.5 C) (Oral)   Ht 5' 8.5" (1.74 m)   Wt 246 lb (111.6 kg)   SpO2 96%   BMI 36.86 kg/m  VS noted,  Constitutional: Pt appears in NAD HENT: Head: NCAT.  Right Ear: External ear normal.  Left Ear: External ear normal.  Eyes: . Pupils are equal, round, and reactive to light. Conjunctivae and EOM are normal Nose: without  d/c or deformity Neck: Neck supple. Gross normal ROM Cardiovascular: Normal rate and regular rhythm.   Pulmonary/Chest: Effort normal and breath sounds without rales or wheezing.  Abd:  Soft, NT, ND, + BS, no organomegaly Neurological: Pt is alert. At baseline orientation, motor grossly intact Skin: Skin is warm. No rashes, other new lesions, no LE edema Psychiatric: Pt behavior is normal without agitation  No other exam findings Lab Results  Component Value Date   WBC 8.2 10/30/2018   HGB 15.6 10/30/2018   HCT 44.9 10/30/2018   PLT 202.0 10/30/2018   GLUCOSE 94 10/30/2018   CHOL 121 10/30/2018   TRIG 217.0 (H) 10/30/2018   HDL 43.40 10/30/2018   LDLDIRECT 59.0 10/30/2018   LDLCALC 61 06/14/2016   ALT 27 10/30/2018   AST 21 10/30/2018   NA 139 10/30/2018   K 4.2 10/30/2018   CL 104 10/30/2018   CREATININE 1.01 10/30/2018   BUN 16 10/30/2018   CO2 28 10/30/2018   TSH 2.93 10/30/2018   PSA 0.34 10/30/2018   HGBA1C 5.2 10/30/2018        Assessment & Plan:

## 2018-10-30 NOTE — Assessment & Plan Note (Signed)

## 2018-10-30 NOTE — Patient Instructions (Signed)

## 2018-10-30 NOTE — Assessment & Plan Note (Signed)
stable overall by history and exam, recent data reviewed with pt, and pt to continue medical treatment as before,  to f/u any worsening symptoms or concerns  

## 2018-10-31 ENCOUNTER — Encounter: Payer: Self-pay | Admitting: Internal Medicine

## 2018-10-31 ENCOUNTER — Other Ambulatory Visit: Payer: Self-pay | Admitting: Internal Medicine

## 2018-10-31 MED ORDER — VITAMIN D (ERGOCALCIFEROL) 1.25 MG (50000 UNIT) PO CAPS: 50000.0000 [IU] | ORAL_CAPSULE | ORAL | 0 refills | Status: DC

## 2019-01-23 ENCOUNTER — Other Ambulatory Visit: Payer: Self-pay | Admitting: Internal Medicine

## 2019-01-31 DIAGNOSIS — M545 Low back pain, unspecified: Secondary | ICD-10-CM | POA: Insufficient documentation

## 2019-02-06 DIAGNOSIS — Y990 Civilian activity done for income or pay: Secondary | ICD-10-CM | POA: Insufficient documentation

## 2019-02-06 DIAGNOSIS — Z579 Occupational exposure to unspecified risk factor: Secondary | ICD-10-CM | POA: Insufficient documentation

## 2019-02-06 DIAGNOSIS — M5126 Other intervertebral disc displacement, lumbar region: Secondary | ICD-10-CM | POA: Insufficient documentation

## 2019-02-14 ENCOUNTER — Other Ambulatory Visit: Payer: Self-pay

## 2019-02-14 ENCOUNTER — Emergency Department (HOSPITAL_COMMUNITY)
Admission: EM | Admit: 2019-02-14 | Discharge: 2019-02-14 | Disposition: A | Discharge status: Home / Self Care | Payer: BC Managed Care – PPO | Attending: Emergency Medicine | Admitting: Emergency Medicine

## 2019-02-14 ENCOUNTER — Encounter (HOSPITAL_COMMUNITY): Payer: Self-pay

## 2019-02-14 ENCOUNTER — Emergency Department (HOSPITAL_COMMUNITY): Payer: BC Managed Care – PPO

## 2019-02-14 DIAGNOSIS — I10 Essential (primary) hypertension: Secondary | ICD-10-CM | POA: Diagnosis not present

## 2019-02-14 DIAGNOSIS — G43109 Migraine with aura, not intractable, without status migrainosus: Secondary | ICD-10-CM | POA: Insufficient documentation

## 2019-02-14 DIAGNOSIS — F17290 Nicotine dependence, other tobacco product, uncomplicated: Secondary | ICD-10-CM | POA: Insufficient documentation

## 2019-02-14 DIAGNOSIS — R112 Nausea with vomiting, unspecified: Secondary | ICD-10-CM | POA: Insufficient documentation

## 2019-02-14 DIAGNOSIS — Z7982 Long term (current) use of aspirin: Secondary | ICD-10-CM | POA: Insufficient documentation

## 2019-02-14 DIAGNOSIS — R519 Headache, unspecified: Secondary | ICD-10-CM | POA: Diagnosis present

## 2019-02-14 DIAGNOSIS — Z79899 Other long term (current) drug therapy: Secondary | ICD-10-CM | POA: Diagnosis not present

## 2019-02-14 LAB — COMPREHENSIVE METABOLIC PANEL
ALT: 43 U/L (ref 0–44)
AST: 29 U/L (ref 15–41)
Albumin: 4.4 g/dL (ref 3.5–5.0)
Alkaline Phosphatase: 60 U/L (ref 38–126)
Anion gap: 10 (ref 5–15)
BUN: 13 mg/dL (ref 8–23)
CO2: 26 mmol/L (ref 22–32)
Calcium: 9.5 mg/dL (ref 8.9–10.3)
Chloride: 104 mmol/L (ref 98–111)
Creatinine, Ser: 1.06 mg/dL (ref 0.61–1.24)
GFR calc Af Amer: >60 mL/min (ref >60)
GFR calc non Af Amer: >60 mL/min (ref >60)
Glucose, Bld: 114 mg/dL — ABNORMAL HIGH (ref 70–99)
Potassium: 3.4 mmol/L — ABNORMAL LOW (ref 3.5–5.1)
Sodium: 140 mmol/L (ref 135–145)
Total Bilirubin: 0.9 mg/dL (ref 0.3–1.2)
Total Protein: 7.2 g/dL (ref 6.5–8.1)

## 2019-02-14 LAB — CBC
HCT: 46.5 % (ref 39.0–52.0)
Hemoglobin: 16.1 g/dL (ref 13.0–17.0)
MCH: 30.6 pg (ref 26.0–34.0)
MCHC: 34.6 g/dL (ref 30.0–36.0)
MCV: 88.2 fL (ref 80.0–100.0)
Platelets: 205 10*3/uL (ref 150–400)
RBC: 5.27 MIL/uL (ref 4.22–5.81)
RDW: 12.9 % (ref 11.5–15.5)
WBC: 8.8 10*3/uL (ref 4.0–10.5)
nRBC: 0 % (ref 0.0–0.2)

## 2019-02-14 LAB — URINALYSIS, ROUTINE W REFLEX MICROSCOPIC
Bilirubin Urine: NEGATIVE
Glucose, UA: NEGATIVE mg/dL
Hgb urine dipstick: NEGATIVE
Ketones, ur: NEGATIVE mg/dL
Leukocytes,Ua: NEGATIVE
Nitrite: NEGATIVE
Protein, ur: NEGATIVE mg/dL
Specific Gravity, Urine: 1.042 — ABNORMAL HIGH (ref 1.005–1.030)
pH: 8 (ref 5.0–8.0)

## 2019-02-14 LAB — I-STAT CHEM 8, ED
BUN: 11 mg/dL (ref 8–23)
Calcium, Ion: 1.24 mmol/L (ref 1.15–1.40)
Chloride: 103 mmol/L (ref 98–111)
Creatinine, Ser: 1 mg/dL (ref 0.61–1.24)
Glucose, Bld: 118 mg/dL — ABNORMAL HIGH (ref 70–99)
HCT: 43 % (ref 39.0–52.0)
Hemoglobin: 14.6 g/dL (ref 13.0–17.0)
Potassium: 3.7 mmol/L (ref 3.5–5.1)
Sodium: 143 mmol/L (ref 135–145)
TCO2: 26 mmol/L (ref 22–32)

## 2019-02-14 LAB — LIPASE, BLOOD: Lipase: 41 U/L (ref 11–51)

## 2019-02-14 MED ORDER — IOHEXOL 350 MG/ML SOLN
75.0000 mL | Freq: Once | INTRAVENOUS | Status: AC | PRN
  Administered 2019-02-14: 75 mL via INTRAVENOUS

## 2019-02-14 MED ORDER — KETOROLAC TROMETHAMINE 30 MG/ML IJ SOLN
30.0000 mg | Freq: Once | INTRAMUSCULAR | Status: AC
  Administered 2019-02-14: 30 mg via INTRAVENOUS
  Filled 2019-02-14: qty 1

## 2019-02-14 MED ORDER — SODIUM CHLORIDE 0.9 % IV BOLUS
1000.0000 mL | Freq: Once | INTRAVENOUS | Status: AC
  Administered 2019-02-14: 1000 mL via INTRAVENOUS

## 2019-02-14 MED ORDER — SODIUM CHLORIDE (PF) 0.9 % IJ SOLN
INTRAMUSCULAR | Status: AC
  Administered 2019-02-14: 18:00:00
  Filled 2019-02-14: qty 50

## 2019-02-14 MED ORDER — DIPHENHYDRAMINE HCL 50 MG/ML IJ SOLN
12.5000 mg | Freq: Once | INTRAMUSCULAR | Status: AC
  Administered 2019-02-14: 12.5 mg via INTRAVENOUS
  Filled 2019-02-14: qty 1

## 2019-02-14 MED ORDER — ONDANSETRON 4 MG PO TBDP: 4.0000 mg | ORAL_TABLET | Freq: Three times a day (TID) | ORAL | 0 refills | Status: DC | PRN

## 2019-02-14 MED ORDER — PROCHLORPERAZINE EDISYLATE 10 MG/2ML IJ SOLN
10.0000 mg | Freq: Once | INTRAMUSCULAR | Status: AC
  Administered 2019-02-14: 10 mg via INTRAVENOUS
  Filled 2019-02-14: qty 2

## 2019-02-14 NOTE — ED Provider Notes (Signed)
[facility] DEPT Provider Note   CSN: [id number] Arrival date & time: 02/14/19  1552     History   Chief Complaint Chief Complaint  Patient presents with   Headache   Nausea   Emesis    HPI Ruben West is a 63 y.o. male with a past medical history significant for fatty liver, GERD, hypertension, hyperlipidemia, and migraine headaches who presents to the ED via EMS due to sudden onset of severe frontal headache. Patient states this headache is different than his others and is the worst headache he has ever experienced. Patient notes he was sitting on the couch watching TV when his headache suddenly began and hit full intensity quickly. Headache is associated with nausea and 30-40 episodes to non-bloody, non-bilious emesis. Patient notes visual disturbances related to his migraine. Patient took his normal migraine medication prior to arrival, but notes he threw it up. Patient denies weakness, abdominal pain, chest pain, and shortness of breath.   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   PUD (peptic ulcer disease)     Patient Active Problem List   Diagnosis Date Noted   Erectile dysfunction 06/12/2018   Myalgia 06/29/2017   Right thyroid nodule 06/13/2017   Degenerative arthritis of knee, bilateral 01/01/2016   Benign paroxysmal positional vertigo 12/10/2015   Wellness examination 06/10/2015   Screening for prostate cancer 06/10/2015   Allergic rhinitis 06/10/2015   Skin nodule 06/10/2015   Screening examination for infectious disease  03/04/2014   Obesity (BMI 30-39.9) 01/13/2014   Numbness 01/12/2014   Migraine with aura 01/12/2014   Atypical chest pain 01/12/2014   Hyperlipidemia 01/12/2014   Migraine, unspecified, without mention of intractable migraine without mention of status migrainosus 06/30/2012   Depression 05/22/2012   Chronic meniscal tear of knee 12/22/2011   FATTY LIVER DISEASE 05/09/2008   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 Authorizing Provider  aspirin EC 81 MG tablet Take 81 mg by mouth every morning.   Yes [provider]  cyclobenzaprine (FLEXERIL) 10 MG tablet Take 10 mg by mouth 3 (three) times daily as needed. 02/03/19  Yes [provider]  eletriptan (RELPAX) 40 MG tablet Take 1 tablet (40 mg total) by mouth as needed for migraine or headache. May repeat in 2 hours if headache persists or recurs. 07/25/18  Yes Patel, Donika K, DO  ibuprofen (ADVIL) 800 MG tablet Take 800 mg by mouth every 8 (eight) hours as needed for moderate pain.   Yes [provider]  losartan-hydrochlorothiazide (HYZAAR) 50-12.5 MG tablet Take 1 tablet by mouth daily. 10/30/18  Yes Cathlean Cower  W, MD  lovastatin (MEVACOR) 40 MG tablet TAKE 1 TABLET BY MOUTH EVERYDAY AT BEDTIME Patient taking differently: Take 40 mg by mouth at bedtime.  10/30/18  Yes Biagio Borg, MD  pioglitazone (ACTOS) 15 MG tablet Take 1 tablet (15 mg total) by mouth daily. 05/25/18  Yes Renato Shin, MD  Vitamin D, Ergocalciferol, (DRISDOL) 1.25 MG (50000 UT) CAPS capsule Take 1 capsule (50,000 Units total) by mouth every 7 (seven)  days. 10/31/18  Yes Biagio Borg, MD  ondansetron (ZOFRAN ODT) 4 MG disintegrating tablet Take 1 tablet (4 mg total) by mouth every 8 (eight) hours as needed for nausea or vomiting. 02/14/19   Cheek, Comer Locket, PA-C  prochlorperazine (COMPAZINE) 5 MG tablet Take 1 tablet (5 mg total) by mouth every 6 (six) hours as needed for nausea or vomiting. Patient not taking: Reported on 02/14/2019 09/05/18   Narda Amber K, DO  tadalafil (CIALIS) 20 MG tablet Take 1 tablet (20 mg total) by mouth daily as needed for up to 30 days for erectile dysfunction. 06/12/18 07/12/18  Biagio Borg, MD    Family History Family History  Problem Relation Age of Onset   Heart attack Mother        MI, deceased 48   Seizures Mother    Emphysema Father        Deceased, 91   Healthy Daughter        x2   Seizures Grandchild    Cancer Neg Hx        no cancer in immediate family   Colon cancer Neg Hx    Esophageal cancer Neg Hx    Rectal cancer Neg Hx    Stomach cancer Neg Hx    Colon polyps Neg Hx     Social History Social History   Tobacco Use   Smoking status: Light Tobacco Smoker    Types: Cigars   Smokeless tobacco: Never Used   Tobacco comment: smokes maybe 5 cigars a year  Substance Use Topics   Alcohol use: Yes    Alcohol/week: 0.0 standard drinks    Comment: Rarely   Drug use: No     Allergies   Gabapentin   Review of Systems Review of Systems  Constitutional: Negative for chills and fever.  Respiratory: Negative for shortness of breath.   Cardiovascular: Negative for chest pain.  Gastrointestinal: Positive for nausea and vomiting. Negative for abdominal pain.  Neurological: Positive for headaches. Negative for speech difficulty and numbness.     Physical Exam Updated Vital Signs BP (!) 144/87    Pulse 77    Temp 97.8 F (36.6 C) (Oral)    Resp 19    Ht 5' 8.5" (1.74 m)    Wt 111 kg    SpO2 95%    BMI 36.67 kg/m   Physical Exam Vitals signs and nursing note  reviewed.  Constitutional:      General: He is not in acute distress.    Comments: Appears very uncomfortable with pillow over head.  HENT:     Head: Normocephalic.  Eyes:     Extraocular Movements: Extraocular movements intact.     Pupils: Pupils are equal, round, and reactive to light.  Neck:     Musculoskeletal: Normal range of motion and neck supple.  Cardiovascular:     Rate and Rhythm: Normal rate and regular rhythm.     Pulses: Normal pulses.     Heart sounds: Normal heart sounds. No murmur. No friction rub.  No gallop.   Pulmonary:     Effort: Pulmonary effort is normal.     Breath sounds: Normal breath sounds.  Abdominal:     General: Abdomen is flat. Bowel sounds are normal. There is no distension.     Palpations: Abdomen is soft.     Tenderness: There is no abdominal tenderness. There is no guarding.     Comments: Emesis bag filled with non-bloody, non-bilious emesis.  Musculoskeletal:     Comments: Able to move all 4 extremities without difficulty.  Skin:    General: Skin is warm.  Neurological:     General: No focal deficit present.     Comments: Speech is clear, able to follow commands CN III-XII intact Normal strength in upper and lower extremities bilaterally including dorsiflexion and plantar flexion, strong and equal grip strength Sensation normal to light and sharp touch Moves extremities without ataxia, coordination intact Normal finger to nose and rapid alternating movements No pronator drift       ED Treatments / Results  Labs (all labs ordered are listed, but only abnormal results are displayed) Labs Reviewed  COMPREHENSIVE METABOLIC PANEL - Abnormal; Notable for the following components:      Result Value   Potassium 3.4 (*)    Glucose, Bld 114 (*)    All other components within normal limits  URINALYSIS, ROUTINE W REFLEX MICROSCOPIC - Abnormal; Notable for the following components:   Specific Gravity, Urine 1.042 (*)    All other components  within normal limits  I-STAT CHEM 8, ED - Abnormal; Notable for the following components:   Glucose, Bld 118 (*)    All other components within normal limits  LIPASE, BLOOD  CBC    EKG EKG Interpretation  Date/Time:  Wednesday February 14 2019 16:10:37 EST Ventricular Rate:  84 PR Interval:    QRS Duration: 87 QT Interval:  411 QTC Calculation: 486 R Axis:   69 Text Interpretation: Sinus rhythm Probable anteroseptal infarct, old Confirmed by Dene Gentry [phone]) on 02/14/2019 4:57:51 PM   Radiology Ct Angio Head W Or Wo Contrast  Result Date: 02/14/2019 CLINICAL DATA:  Headache, acute, severe, worse headache of life. EXAM: CT ANGIOGRAPHY HEAD AND NECK TECHNIQUE: Multidetector CT imaging of the head and neck was performed using the standard protocol during bolus administration of intravenous contrast. Multiplanar CT image reconstructions and MIPs were obtained to evaluate the vascular anatomy. Carotid stenosis measurements (when applicable) are obtained utilizing NASCET criteria, using the distal internal carotid diameter as the denominator. CONTRAST:  36mL OMNIPAQUE IOHEXOL 350 MG/ML SOLN COMPARISON:  None. Head CT 05/18/2016, MRI/MRA head 01/13/2014 (images unavailable) FINDINGS: CT HEAD FINDINGS Brain: No evidence of acute intracranial hemorrhage. No demarcated cortical infarction. No evidence of intracranial mass. No midline shift or extra-axial fluid collection. Cerebral volume is normal for age. Vascular: Reported separately Skull: Normal. Negative for fracture or focal lesion. Sinuses: Mild scattered paranasal sinus mucosal thickening, greatest within the inferior left maxillary sinus. Orbits: Visualized orbits demonstrate no acute abnormality. CTA NECK FINDINGS Aortic arch: Standard aortic branching. Right carotid system: CCA and ICA smooth and widely patent within the neck without stenosis. Partially retropharyngeal course of the right ICA. Left carotid system: CCA and ICA smooth  and widely patent within the neck without stenosis. Vertebral arteries: Codominant. The vertebral arteries are widely patent throughout the neck. Skeleton: No acute bony abnormality or suspicious osseous lesion. Mild cervical spondylosis. Other neck: 1.8 cm right thyroid lobe nodule. No soft tissue neck mass or  pathologically enlarged cervical chain lymph nodes. Upper chest: No consolidation within the imaged lung apices. Review of the MIP images confirms the above findings CTA HEAD FINDINGS Anterior circulation: The intracranial internal carotid arteries are patent without significant stenosis. The right middle and anterior cerebral arteries are patent without significant proximal stenosis. The left middle and anterior cerebral arteries are patent without significant proximal stenosis. No intracranial aneurysm is identified. Posterior circulation: Soft and calcified plaque within the intracranial vertebral arteries. Sites of up to mild stenosis within the intracranial right vertebral artery. Sites of up to mild/moderate stenosis within the intracranial left vertebral artery. The basilar artery is widely patent. The bilateral posterior cerebral arteries are patent without significant proximal stenosis. Venous sinuses: Within limitations of contrast timing, no convincing thrombus. Anatomic variants: Posterior communicating arteries are poorly delineated bilaterally and may be hypoplastic or absent. Review of the MIP images confirms the above findings IMPRESSION: CT head: Normal noncontrast CT appearance of the brain for age. No evidence of acute intracranial abnormality. CTA neck: 1. Common carotid, internal carotid and vertebral arteries patent within the neck without stenosis 2. 1.8 cm right thyroid lobe nodule. This was previously imaged by thyroid ultrasound on 06/22/2017 with biopsy recommended at that time. Please correlate with any available pathology. CTA head: 1. No intracranial large vessel occlusion. 2.  Mixed plaque within the intracranial vertebral arteries with sites of up to mild stenosis on the right and mild/moderate stenosis on the left. 3. No intracranial aneurysm identified. Electronically Signed   By: Kellie Simmering DO   On: 02/14/2019 17:59   Ct Angio Neck W And/or Wo Contrast  Result Date: 02/14/2019 CLINICAL DATA:  Headache, acute, severe, worse headache of life. EXAM: CT ANGIOGRAPHY HEAD AND NECK TECHNIQUE: Multidetector CT imaging of the head and neck was performed using the standard protocol during bolus administration of intravenous contrast. Multiplanar CT image reconstructions and MIPs were obtained to evaluate the vascular anatomy. Carotid stenosis measurements (when applicable) are obtained utilizing NASCET criteria, using the distal internal carotid diameter as the denominator. CONTRAST:  54mL OMNIPAQUE IOHEXOL 350 MG/ML SOLN COMPARISON:  None. Head CT 05/18/2016, MRI/MRA head 01/13/2014 (images unavailable) FINDINGS: CT HEAD FINDINGS Brain: No evidence of acute intracranial hemorrhage. No demarcated cortical infarction. No evidence of intracranial mass. No midline shift or extra-axial fluid collection. Cerebral volume is normal for age. Vascular: Reported separately Skull: Normal. Negative for fracture or focal lesion. Sinuses: Mild scattered paranasal sinus mucosal thickening, greatest within the inferior left maxillary sinus. Orbits: Visualized orbits demonstrate no acute abnormality. CTA NECK FINDINGS Aortic arch: Standard aortic branching. Right carotid system: CCA and ICA smooth and widely patent within the neck without stenosis. Partially retropharyngeal course of the right ICA. Left carotid system: CCA and ICA smooth and widely patent within the neck without stenosis. Vertebral arteries: Codominant. The vertebral arteries are widely patent throughout the neck. Skeleton: No acute bony abnormality or suspicious osseous lesion. Mild cervical spondylosis. Other neck: 1.8 cm right thyroid  lobe nodule. No soft tissue neck mass or pathologically enlarged cervical chain lymph nodes. Upper chest: No consolidation within the imaged lung apices. Review of the MIP images confirms the above findings CTA HEAD FINDINGS Anterior circulation: The intracranial internal carotid arteries are patent without significant stenosis. The right middle and anterior cerebral arteries are patent without significant proximal stenosis. The left middle and anterior cerebral arteries are patent without significant proximal stenosis. No intracranial aneurysm is identified. Posterior circulation: Soft and calcified plaque within the intracranial vertebral  arteries. Sites of up to mild stenosis within the intracranial right vertebral artery. Sites of up to mild/moderate stenosis within the intracranial left vertebral artery. The basilar artery is widely patent. The bilateral posterior cerebral arteries are patent without significant proximal stenosis. Venous sinuses: Within limitations of contrast timing, no convincing thrombus. Anatomic variants: Posterior communicating arteries are poorly delineated bilaterally and may be hypoplastic or absent. Review of the MIP images confirms the above findings IMPRESSION: CT head: Normal noncontrast CT appearance of the brain for age. No evidence of acute intracranial abnormality. CTA neck: 1. Common carotid, internal carotid and vertebral arteries patent within the neck without stenosis 2. 1.8 cm right thyroid lobe nodule. This was previously imaged by thyroid ultrasound on 06/22/2017 with biopsy recommended at that time. Please correlate with any available pathology. CTA head: 1. No intracranial large vessel occlusion. 2. Mixed plaque within the intracranial vertebral arteries with sites of up to mild stenosis on the right and mild/moderate stenosis on the left. 3. No intracranial aneurysm identified. Electronically Signed   By: Kellie Simmering DO   On: 02/14/2019 17:59     Procedures Procedures (including critical care time)  Medications Ordered in ED Medications  iohexol (OMNIPAQUE) 350 MG/ML injection 75 mL (75 mLs Intravenous Contrast Given 02/14/19 1716)  sodium chloride (PF) 0.9 % injection (  Given by Other 02/14/19 1735)  prochlorperazine (COMPAZINE) injection 10 mg (10 mg Intravenous Given 02/14/19 1753)  diphenhydrAMINE (BENADRYL) injection 12.5 mg (12.5 mg Intravenous Given 02/14/19 1753)  sodium chloride 0.9 % bolus 1,000 mL (0 mLs Intravenous Stopped 02/14/19 1957)  ketorolac (TORADOL) 30 MG/ML injection 30 mg (30 mg Intravenous Given 02/14/19 2014)     Initial Impression / Assessment and Plan / ED Course  I have reviewed the triage vital signs and the nursing notes.  Pertinent labs & imaging results that were available during my care of the patient were reviewed by me and considered in my medical decision making (see chart for details).       63 year old male presents for an evaluation of a headache. Patient with history of migraine headaches who sees Dr. Arvin Collard. Reviewed prior neurology notes. Patient notes this is the worst headache he has ever had. Vitals reviewed and all within normal limits except elevated BP. Will continue to monitor. Patient in no acute distress, but appears very uncomfortable. Abdomen soft, non-distended, non-tender. Emesis bag full. Neurological exam unremarkable. Triage ordered labs. Given this is the worst headache of his life with sudden onset will order CTA of head/neck to rule out subarachnoid. Will give migraine cocktail, but hold Toradol until CTA is available due to risk of bleed. IVFs. EKG ordered at triage?  CTA personally reviewed which shows no intracranial hemorrhage. UA negative for signs of infection. CMP and CBC reassuring with no leukocytosis. Mild hypokalemia at 3.4. Normal renal function. Lipase normal. EKG reviewed and demonstrates normal sinus rhythm without new signs of ischemia.    7:07  PM Re evaluated patient and he notes his headache is a 2/10. Will Give Toradol given CTA negative. Suspect headache related to his migraine disorder. Patient will be discharged home with Zofran. Patient has been advised to follow-up with his neurologist for further evaluation within the next week. Strict ED precautions discussed with patient. Patient states understanding and agrees to plan. Patient discharged home in no acute distress.  Final Clinical Impressions(s) / ED Diagnoses   Final diagnoses:  Migraine with aura and without status migrainosus, not intractable    ED  Discharge Orders         Ordered    ondansetron (ZOFRAN ODT) 4 MG disintegrating tablet  Every 8 hours PRN     02/14/19 2006           Romie Levee 02/14/19 2019    Valarie Merino, MD 02/15/19 0000

## 2019-02-14 NOTE — ED Triage Notes (Signed)
Patient arrived via [facility], from home. Patient is ambulatory. Patient called 911 due to having severe headache with nausea and with emesis. Symptoms began today around 1230.   Patient stated he has hx of chronic migraines and was unable to keep meds down. Patient stated this migraine also does not feel like normal, typical migraines.  4mg  of Zofran given IV

## 2019-02-14 NOTE — Discharge Instructions (Addendum)
I am sending you home with zofran. Take as prescribed. Call to schedule an appointment with your neurologist for further evaluation within the next week they may need to adjust your medications. If you headache returns, take your migraine medication as prescribed.Your scans today were negative for a brain bleed. Return to the ER for new or worsening symptoms.

## 2019-02-16 ENCOUNTER — Encounter: Payer: Self-pay | Admitting: Neurology

## 2019-02-19 ENCOUNTER — Telehealth (INDEPENDENT_AMBULATORY_CARE_PROVIDER_SITE_OTHER): Payer: BC Managed Care – PPO | Admitting: Neurology

## 2019-02-19 ENCOUNTER — Other Ambulatory Visit: Payer: Self-pay

## 2019-02-19 VITALS — Ht 68.5 in | Wt 248.0 lb

## 2019-02-19 DIAGNOSIS — G43109 Migraine with aura, not intractable, without status migrainosus: Secondary | ICD-10-CM

## 2019-02-19 MED ORDER — ZOLMITRIPTAN 5 MG PO TBDP: 5.0000 mg | ORAL_TABLET | ORAL | 11 refills | Status: DC | PRN

## 2019-02-19 NOTE — Progress Notes (Signed)
   Virtual Visit via Video Note The purpose of this virtual visit is to provide medical care while limiting exposure to the novel coronavirus.    Consent was obtained for video visit:  Yes.   Answered questions that patient had about telehealth interaction:  Yes.   I discussed the limitations, risks, security and privacy concerns of performing an evaluation and management service by telemedicine. I also discussed with the patient that there may be a patient responsible charge related to this service. The patient expressed understanding and agreed to proceed.  Pt location: Work Physician Location: office Name of referring provider:  Biagio Borg, MD I connected with Ruben West at patients initiation/request on 02/19/2019 at 10:30 AM EST by video enabled telemedicine application and verified that I am speaking with the correct person using two identifiers. Pt MRN:  [id number] Pt DOB:  1955/11/18 Video Participants:  Ruben West   History of Present Illness: This is a 63 y.o. male returning for follow-up of migraine.  He has noticed increased frequency and severity of headaches occurring about 2-3 times every 2 months.  He initially used to get prompt relief with Relpax, but lately, he does not get relief.  Last week, he developed bifrontal headache with nausea and went to the ER. CT head was unremarkable. Typically his headaches start in the base of his neck and extend into the head.   He has been out of work on [employer]  for the past month due to lumbar disc herniation.    Observations/Objective:   Patient is awake, alert, and appears comfortable.  Oriented x 4.   Extraocular muscles are intact. No ptosis.  Face is symmetric.  Speech is not dysarthric. Tongue is midline. Antigravity in all extremities.  No pronator drift.  DATA: CT/A head and neck 02/19/2019: 1. Common carotid, internal carotid and vertebral arteries patent within the neck without stenosis 2. 1.8 cm right  thyroid lobe nodule. This was previously imaged by thyroid ultrasound on 06/22/2017 with biopsy recommended at that time. Please correlate with any available pathology.  CTA head:  1. No intracranial large vessel occlusion. 2. Mixed plaque within the intracranial vertebral arteries with sites of up to mild stenosis on the right and mild/moderate stenosis on the left. 3. No intracranial aneurysm identified.  Assessment and Plan:  Episodic migraine without aura, increased frequency and intensity of migraines to 2-3 every 2 months  - Previously tried: imitrex, Relpax  - Start zomig 5mg  OD for severe migraine  - Keep headache journal    Follow Up Instructions:   I discussed the assessment and treatment plan with the patient. The patient was provided an opportunity to ask questions and all were answered. The patient agreed with the plan and demonstrated an understanding of the instructions.   The patient was advised to call back or seek an in-person evaluation if the symptoms worsen or if the condition fails to improve as anticipated.  Return to clinic in 6 months  Greater than 50% of this 20 minute visit was spent in counseling, explanation of diagnosis, planning of further management, and coordination of care.    Alda Berthold, DO

## 2019-06-10 ENCOUNTER — Other Ambulatory Visit: Payer: Self-pay | Admitting: Endocrinology

## 2019-06-10 DIAGNOSIS — R7309 Other abnormal glucose: Secondary | ICD-10-CM

## 2019-06-13 ENCOUNTER — Encounter: Payer: Self-pay | Admitting: Neurology

## 2019-06-14 ENCOUNTER — Telehealth (INDEPENDENT_AMBULATORY_CARE_PROVIDER_SITE_OTHER): Payer: BC Managed Care – PPO | Admitting: Neurology

## 2019-06-14 ENCOUNTER — Other Ambulatory Visit: Payer: Self-pay

## 2019-06-14 ENCOUNTER — Encounter: Payer: Self-pay | Admitting: Neurology

## 2019-06-14 VITALS — Ht 68.5 in | Wt 255.0 lb

## 2019-06-14 DIAGNOSIS — G43109 Migraine with aura, not intractable, without status migrainosus: Secondary | ICD-10-CM

## 2019-06-14 NOTE — Progress Notes (Signed)
   Due to the COVID-19 crisis, this virtual check-in visit was done via telephone from my office and it was initiated and consent given by this patient and or family.   Telephone (Audio) Visit The purpose of this telephone visit is to provide medical care while limiting exposure to the novel coronavirus.    Consent was obtained for telephone visit and initiated by pt/family:  Yes.   Answered questions that patient had about telehealth interaction:  Yes.   I discussed the limitations, risks, security and privacy concerns of performing an evaluation and management service by telephone. I also discussed with the patient that there may be a patient responsible charge related to this service. The patient expressed understanding and agreed to proceed.  Pt location: Home Physician Location: office Name of referring provider:  Biagio Borg, MD I connected with .Ruben West at patients initiation/request on 06/14/2019 at  7:50 AM EDT by telephone and verified that I am speaking with the correct person using two identifiers.  Pt MRN:  [id number] Pt DOB:  [birth year]   History of Present Illness: This is a 64 year-old man returning for follow-up of migraines.  His 37 year old daughter passed away in 06/19/22 and since this time, headaches are more frequent.  He has bifrontal headaches, which radiates towards the temples.   He gets headaches about every 2-3 weeks, lasts about 24 hours.  He has relief with Zomig which starts to alleviate pain within 30-min.     Assessment and Plan:   Episodic migraine without aura, recent stressors have exacerbated headaches, but still episodic and occurring every 2-3 weeks.    - Previously tried:  Relpax, imitrex  - Continue Zomig 5mg  as needed for severe migraine   Follow Up Instructions:   I discussed the assessment and treatment plan with the patient. The patient was provided an opportunity to ask questions and all were answered. The patient agreed with the plan  and demonstrated an understanding of the instructions.   The patient was advised to call back or seek an in-person evaluation if the symptoms worsen or if the condition fails to improve as anticipated.  Return to clinic in November 2021  Total Time spent in visit with the patient was:  12 min, of which 100% of the time was spent in counseling and/or coordinating care.   Pt understands and agrees with the plan of care outlined.     Alda Berthold, DO

## 2019-07-30 ENCOUNTER — Ambulatory Visit: Payer: BLUE CROSS/BLUE SHIELD | Admitting: Neurology

## 2019-08-26 ENCOUNTER — Other Ambulatory Visit: Payer: Self-pay | Admitting: Neurology

## 2019-10-09 ENCOUNTER — Other Ambulatory Visit: Payer: Self-pay | Admitting: Internal Medicine

## 2019-10-09 NOTE — Telephone Encounter (Signed)
Please refill as per office routine med refill policy (all routine meds refilled for 3 mo or monthly per pt preference up to one year from last visit, then month to month grace period for 3 mo, then further med refills will have to be denied)  

## 2019-11-05 ENCOUNTER — Encounter: Payer: BC Managed Care – PPO | Admitting: Internal Medicine

## 2019-11-20 ENCOUNTER — Encounter: Payer: BC Managed Care – PPO | Admitting: Internal Medicine

## 2019-11-27 ENCOUNTER — Other Ambulatory Visit: Payer: Self-pay | Admitting: Internal Medicine

## 2019-11-27 NOTE — Telephone Encounter (Signed)
Please refill as per office routine med refill policy (all routine meds refilled for 3 mo or monthly per pt preference up to one year from last visit, then month to month grace period for 3 mo, then further med refills will have to be denied)  

## 2019-12-03 ENCOUNTER — Other Ambulatory Visit: Payer: Self-pay | Admitting: Endocrinology

## 2019-12-03 DIAGNOSIS — R7309 Other abnormal glucose: Secondary | ICD-10-CM

## 2019-12-04 ENCOUNTER — Ambulatory Visit (INDEPENDENT_AMBULATORY_CARE_PROVIDER_SITE_OTHER): Payer: BC Managed Care – PPO | Admitting: Internal Medicine

## 2019-12-04 ENCOUNTER — Encounter: Payer: Self-pay | Admitting: Internal Medicine

## 2019-12-04 ENCOUNTER — Other Ambulatory Visit: Payer: Self-pay

## 2019-12-04 VITALS — BP 140/90 | HR 89 | Temp 98.7°F | Ht 68.5 in

## 2019-12-04 DIAGNOSIS — E559 Vitamin D deficiency, unspecified: Secondary | ICD-10-CM | POA: Diagnosis not present

## 2019-12-04 DIAGNOSIS — R7309 Other abnormal glucose: Secondary | ICD-10-CM

## 2019-12-04 DIAGNOSIS — N529 Male erectile dysfunction, unspecified: Secondary | ICD-10-CM | POA: Diagnosis not present

## 2019-12-04 DIAGNOSIS — Z0000 Encounter for general adult medical examination without abnormal findings: Secondary | ICD-10-CM | POA: Diagnosis not present

## 2019-12-04 MED ORDER — SILDENAFIL CITRATE 100 MG PO TABS: 50.0000 mg | ORAL_TABLET | Freq: Every day | ORAL | 11 refills | Status: DC | PRN

## 2019-12-04 NOTE — Progress Notes (Signed)
Subjective:    Patient ID: Ruben West, male    DOB: 04-Sep-1955, 64 y.o.   MRN: [id number]  HPI  Here for wellness and f/u;  Overall doing ok;  Pt denies Chest pain, worsening SOB, DOE, wheezing, orthopnea, PND, worsening LE edema, palpitations, dizziness or syncope.  Pt denies neurological change such as new headache, facial or extremity weakness.  Pt denies polydipsia, polyuria, or low sugar symptoms. Pt states overall good compliance with treatment and medications, good tolerability, and has been trying to follow appropriate diet.  Pt denies worsening depressive symptoms, suicidal ideation or panic. No fever, night sweats, wt loss, loss of appetite, or other constitutional symptoms.  Pt states good ability with ADL's, has low fall risk, home safety reviewed and adequate, no other significant changes in hearing or vision, and only occasionally active with exercise.  Daughter died 03-19-2021with drug OD.   Past Medical History:  Diagnosis Date  . ALLERGIC RHINITIS 11/23/2006  . Back pain   . 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 FATTY LIVER DISEASE 05/09/2008  . GERD 04/04/2007   no per pt  . Headache    Migraines a few times a year 1993  . Heart murmur   . Hypercholesteremia   . HYPERGLYCEMIA 04/04/2007  . HYPERTENSION 11/23/2006  . Migraines   . NASH (nonalcoholic steatohepatitis)   . 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 PUD (peptic ulcer disease)    Past Surgical History:  Procedure Laterality Date  . COLONOSCOPY    . ELECTROCARDIOGRAM  02/21/2006  . KNEE SURGERY Left    2015  . VASECTOMY      reports that he has been smoking cigars. He has never used smokeless tobacco. He reports current alcohol use. He reports that he does not use drugs. family history includes Emphysema in his father; Healthy in his daughter; Heart attack in his mother; Seizures in his grandchild and mother. Allergies  Allergen Reactions  . Gabapentin Nausea And Vomiting   Current Outpatient  Medications on File Prior to Visit  Medication Sig Dispense Refill  . aspirin EC 81 MG tablet Take 81 mg by mouth every morning.    . Cholecalciferol (VITAMIN D-3) 25 MCG (1000 UT) CAPS Take 1 capsule by mouth daily.    Marland Kitchen eletriptan (RELPAX) 40 MG tablet TAKE 1 TABLET BY MOUTH AS NEEDED FOR MIGRAINE OR HEADACHE. MAY REPEAT IN 2 HRS IF PERSISTS OR RECURS 6 tablet 19  . famotidine (PEPCID) 20 MG tablet Take 20 mg by mouth 2 (two) times daily.    Marland Kitchen ibuprofen (ADVIL) 800 MG tablet Take 800 mg by mouth every 8 (eight) hours as needed for moderate pain.    Marland Kitchen losartan-hydrochlorothiazide (HYZAAR) 50-12.5 MG tablet TAKE 1 TABLET BY MOUTH EVERY DAY 30 tablet 2  . lovastatin (MEVACOR) 40 MG tablet TAKE 1 TABLET BY MOUTH EVERYDAY AT BEDTIME 90 tablet 1  . ondansetron (ZOFRAN ODT) 4 MG disintegrating tablet Take 1 tablet (4 mg total) by mouth every 8 (eight) hours as needed for nausea or vomiting. 20 tablet 0  . pioglitazone (ACTOS) 15 MG tablet TAKE 1 TABLET BY MOUTH EVERY DAY 90 tablet 3  . sucralfate (CARAFATE) 1 g tablet Take 1 g by mouth 4 (four) times daily.    Marland Kitchen zolmitriptan (ZOMIG ZMT) 5 MG disintegrating tablet Take 1 tablet (5 mg total) by mouth as needed for migraine. 10 tablet 11  . tadalafil (CIALIS) 20 MG tablet Take 1 tablet (20 mg total) by mouth  daily as needed for up to 30 days for erectile dysfunction. 10 tablet 11   No current facility-administered medications on file prior to visit.   Review of Systems All otherwise neg per pt    Objective:   Physical Exam BP 140/90 (BP Location: Left Arm, Patient Position: Sitting, Cuff Size: Large)   Pulse 89   Temp 98.7 F (37.1 C) (Oral)   Ht 5' 8.5" (1.74 m)   SpO2 95%   BMI 38.21 kg/m  VS noted,  Constitutional: Pt appears in NAD HENT: Head: NCAT.  Right Ear: External ear normal.  Left Ear: External ear normal.  Eyes: . Pupils are equal, round, and reactive to light. Conjunctivae and EOM are normal Nose: without d/c or  deformity Neck: Neck supple. Gross normal ROM Cardiovascular: Normal rate and regular rhythm.   Pulmonary/Chest: Effort normal and breath sounds without rales or wheezing.  Abd:  Soft, NT, ND, + BS, no organomegaly Neurological: Pt is alert. At baseline orientation, motor grossly intact Skin: Skin is warm. No rashes, other new lesions, no LE edema Psychiatric: Pt behavior is normal without agitation  All otherwise neg per pt Lab Results  Component Value Date   WBC 8.8 02/14/2019   HGB 14.6 02/14/2019   HCT 43.0 02/14/2019   PLT 205 02/14/2019   GLUCOSE 118 (H) 02/14/2019   CHOL 121 10/30/2018   TRIG 217.0 (H) 10/30/2018   HDL 43.40 10/30/2018   LDLDIRECT 59.0 10/30/2018   LDLCALC 61 06/14/2016   ALT 43 02/14/2019   AST 29 02/14/2019   NA 143 02/14/2019   K 3.7 02/14/2019   CL 103 02/14/2019   CREATININE 1.00 02/14/2019   BUN 11 02/14/2019   CO2 26 02/14/2019   TSH 2.93 10/30/2018   PSA 0.34 10/30/2018   HGBA1C 5.2 10/30/2018      Assessment & Plan:

## 2019-12-04 NOTE — Assessment & Plan Note (Signed)

## 2019-12-04 NOTE — Addendum Note (Signed)
Addended by: Cresenciano Lick on: 12/04/2019 01:39 PM   Modules accepted: Orders

## 2019-12-04 NOTE — Assessment & Plan Note (Signed)
stable overall by history and exam, recent data reviewed with pt, and pt to continue medical treatment as before,  to f/u any worsening symptoms or concerns  

## 2019-12-04 NOTE — Assessment & Plan Note (Signed)
Pt asks for change to viagra prn

## 2019-12-04 NOTE — Assessment & Plan Note (Signed)
Cont otc oral replacement

## 2019-12-04 NOTE — Patient Instructions (Signed)

## 2019-12-05 ENCOUNTER — Encounter: Payer: Self-pay | Admitting: Internal Medicine

## 2019-12-05 LAB — URINALYSIS, ROUTINE W REFLEX MICROSCOPIC
Bilirubin Urine: NEGATIVE
Glucose, UA: NEGATIVE
Hgb urine dipstick: NEGATIVE
Ketones, ur: NEGATIVE
Leukocytes,Ua: NEGATIVE
Nitrite: NEGATIVE
Protein, ur: NEGATIVE
Specific Gravity, Urine: 1.015 (ref 1.001–1.03)
pH: 6.5 (ref 5.0–8.0)

## 2019-12-05 LAB — COMPLETE METABOLIC PANEL WITH GFR
AG Ratio: 1.9 (calc) (ref 1.0–2.5)
ALT: 28 U/L (ref 9–46)
AST: 24 U/L (ref 10–35)
Albumin: 4.2 g/dL (ref 3.6–5.1)
Alkaline phosphatase (APISO): 62 U/L (ref 35–144)
BUN: 13 mg/dL (ref 7–25)
CO2: 30 mmol/L (ref 20–32)
Calcium: 9.4 mg/dL (ref 8.6–10.3)
Chloride: 104 mmol/L (ref 98–110)
Creat: 1.09 mg/dL (ref 0.70–1.25)
GFR, Est African American: 83 mL/min/{1.73_m2} (ref >=60)
GFR, Est Non African American: 71 mL/min/{1.73_m2} (ref >=60)
Globulin: 2.2 g/dL (calc) (ref 1.9–3.7)
Glucose, Bld: 79 mg/dL (ref 65–99)
Potassium: 4.5 mmol/L (ref 3.5–5.3)
Sodium: 142 mmol/L (ref 135–146)
Total Bilirubin: 0.6 mg/dL (ref 0.2–1.2)
Total Protein: 6.4 g/dL (ref 6.1–8.1)

## 2019-12-05 LAB — HEMOGLOBIN A1C
Hgb A1c MFr Bld: 5 % of total Hgb (ref <5.7)
Mean Plasma Glucose: 97 (calc)
eAG (mmol/L): 5.4 (calc)

## 2019-12-05 LAB — LIPID PANEL
Cholesterol: 125 mg/dL (ref <200)
HDL: 43 mg/dL (ref >=40)
LDL Cholesterol (Calc): 56 mg/dL (calc)
Non-HDL Cholesterol (Calc): 82 mg/dL (calc) (ref <130)
Total CHOL/HDL Ratio: 2.9 (calc) (ref <5.0)
Triglycerides: 190 mg/dL — ABNORMAL HIGH (ref <150)

## 2019-12-05 LAB — PSA: PSA: 0.3 ng/mL (ref <=4.0)

## 2019-12-05 LAB — VITAMIN D 25 HYDROXY (VIT D DEFICIENCY, FRACTURES): Vit D, 25-Hydroxy: 33 ng/mL (ref 30–100)

## 2019-12-05 LAB — TSH: TSH: 4.18 mIU/L (ref 0.40–4.50)

## 2019-12-07 ENCOUNTER — Encounter: Payer: Self-pay | Admitting: Internal Medicine

## 2020-01-06 ENCOUNTER — Other Ambulatory Visit: Payer: Self-pay | Admitting: Internal Medicine

## 2020-01-06 NOTE — Telephone Encounter (Signed)
Please refill as per office routine med refill policy (all routine meds refilled for 3 mo or monthly per pt preference up to one year from last visit, then month to month grace period for 3 mo, then further med refills will have to be denied)  

## 2020-01-28 ENCOUNTER — Ambulatory Visit: Payer: BC Managed Care – PPO | Admitting: Neurology

## 2020-02-22 ENCOUNTER — Other Ambulatory Visit: Payer: Self-pay | Admitting: Neurology

## 2020-03-31 ENCOUNTER — Telehealth: Payer: BC Managed Care – PPO | Admitting: Neurology

## 2020-05-08 ENCOUNTER — Encounter: Payer: Self-pay | Admitting: Physical Medicine & Rehabilitation

## 2020-05-09 ENCOUNTER — Encounter: Payer: Self-pay | Admitting: Physical Medicine and Rehabilitation

## 2020-05-09 ENCOUNTER — Other Ambulatory Visit: Payer: Self-pay

## 2020-05-09 ENCOUNTER — Encounter
Payer: Worker's Compensation | Attending: Physical Medicine and Rehabilitation | Admitting: Physical Medicine and Rehabilitation

## 2020-05-09 VITALS — BP 163/98 | HR 87 | Temp 98.8°F | Ht 68.5 in | Wt 257.8 lb

## 2020-05-09 DIAGNOSIS — M5412 Radiculopathy, cervical region: Secondary | ICD-10-CM | POA: Diagnosis present

## 2020-05-09 DIAGNOSIS — G43119 Migraine with aura, intractable, without status migrainosus: Secondary | ICD-10-CM | POA: Diagnosis present

## 2020-05-09 DIAGNOSIS — M7918 Myalgia, other site: Secondary | ICD-10-CM | POA: Diagnosis not present

## 2020-05-09 DIAGNOSIS — F0781 Postconcussional syndrome: Secondary | ICD-10-CM | POA: Diagnosis present

## 2020-05-09 MED ORDER — DULOXETINE HCL 30 MG PO CPEP: 30.0000 mg | ORAL_CAPSULE | Freq: Every day | ORAL | 5 refills | Status: DC

## 2020-05-09 MED ORDER — ZOLMITRIPTAN 5 MG PO TBDP: ORAL_TABLET | ORAL | 5 refills | Status: DC

## 2020-05-09 MED ORDER — METAXALONE 800 MG PO TABS: 800.0000 mg | ORAL_TABLET | Freq: Three times a day (TID) | ORAL | 5 refills | Status: DC | PRN
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
## 2020-05-12 ENCOUNTER — Encounter: Payer: Self-pay | Admitting: Neurology

## 2020-05-12 ENCOUNTER — Other Ambulatory Visit: Payer: Self-pay

## 2020-05-12 ENCOUNTER — Ambulatory Visit: Payer: BC Managed Care – PPO | Admitting: Neurology

## 2020-05-12 VITALS — BP 153/99 | HR 87 | Ht 68.0 in | Wt 258.0 lb

## 2020-05-12 DIAGNOSIS — G43109 Migraine with aura, not intractable, without status migrainosus: Secondary | ICD-10-CM

## 2020-05-12 DIAGNOSIS — G44309 Post-traumatic headache, unspecified, not intractable: Secondary | ICD-10-CM | POA: Diagnosis not present

## 2020-05-12 NOTE — Progress Notes (Signed)
Follow-up Visit   Date: 05/12/20   Ruben West MRN: [id number] DOB: 06/07/1955   Interim History: Ruben West is a 65 y.o. right-handed Caucasian male with hyperlipidemia and hypertension returning to the clinic for follow-up of migraines.  The patient was accompanied to the clinic by self.  He slipped on ice in January and hit the front of his head.  There was no loss of consciousness.  Since this time, has dull achy headache over the right parietal region with episodic shooting pain down the right arm.  He also had nausea and vomiting.  CT head and neck was unremarkable.  He saw PM&R and will be getting trigger point injections.  He was also started on Cymbalta 30mg  daily and muscle relaxants.  He continues to have neck pain and is getting MRI cervical spine and has an upcoming appointment with Dr. Saintclair Halsted.  Prior to his falls, his migraines were very well-controlled occurring every 3 months which responds well to Zomig.   Medications:  Current Outpatient Medications on File Prior to Visit  Medication Sig Dispense Refill  . aspirin EC 81 MG tablet Take 81 mg by mouth every morning.    . Cholecalciferol (VITAMIN D-3) 25 MCG (1000 UT) CAPS Take 1 capsule by mouth daily.    . cyclobenzaprine (FLEXERIL) 5 MG tablet Take 1 tablet by mouth at bedtime as needed for spasms.    . DULoxetine (CYMBALTA) 30 MG capsule Take 1 capsule (30 mg total) by mouth at bedtime. 60 capsule 5  . famotidine (PEPCID) 20 MG tablet Take 20 mg by mouth 2 (two) times daily.    Marland Kitchen ibuprofen (ADVIL) 800 MG tablet Take 800 mg by mouth every 8 (eight) hours as needed for moderate pain.    Marland Kitchen losartan-hydrochlorothiazide (HYZAAR) 50-12.5 MG tablet TAKE 1 TABLET BY MOUTH EVERY DAY 90 tablet 1  . lovastatin (MEVACOR) 40 MG tablet TAKE 1 TABLET BY MOUTH EVERYDAY AT BEDTIME 90 tablet 1  . metaxalone (SKELAXIN) 800 MG tablet Take 1 tablet (800 mg total) by mouth 3 (three) times daily as needed for muscle spasms. 60  tablet 5  . naproxen (NAPROSYN) 500 MG tablet Take 1 tablet by mouth 2 (two) times daily.    . ondansetron (ZOFRAN ODT) 4 MG disintegrating tablet Take 1 tablet (4 mg total) by mouth every 8 (eight) hours as needed for nausea or vomiting. 20 tablet 0  . pioglitazone (ACTOS) 15 MG tablet TAKE 1 TABLET BY MOUTH EVERY DAY 90 tablet 3  . promethazine (PHENERGAN) 25 MG tablet Take 25 mg by mouth every 8 (eight) hours as needed. for nausea    . sildenafil (VIAGRA) 100 MG tablet Take 0.5-1 tablets (50-100 mg total) by mouth daily as needed for erectile dysfunction. 5 tablet 11  . sucralfate (CARAFATE) 1 g tablet Take 1 g by mouth 4 (four) times daily.    Marland Kitchen tiZANidine (ZANAFLEX) 4 MG tablet Take 1 tablet by mouth every 6 (six) hours as needed.    . zolmitriptan (ZOMIG-ZMT) 5 MG disintegrating tablet DISSOLVE 1 TABLET ON TONGUE AS NEEDED FOR MIGRAINE 10 tablet 5   No current facility-administered medications on file prior to visit.    Allergies:  Allergies  Allergen Reactions  . Gabapentin Nausea And Vomiting    Vital Signs:  BP (!) 153/99   Pulse 87   Ht 5\' 8"  (1.727 m)   Wt 258 lb (117 kg)   SpO2 97%   BMI 39.23 kg/m  Neurological Exam: MENTAL STATUS including orientation to time, place, person, recent and remote memory, attention span and concentration, language, and fund of knowledge is normal.  Speech is not dysarthric.  CRANIAL NERVES:  No visual field defects.  Pupils equal round and reactive to light.  Normal conjugate, extra-ocular eye movements in all directions of gaze.  No ptosis.  Face is symmetric. Palate elevates symmetrically.  Tongue is midline.  MOTOR:  Motor strength is 5/5 in all extremities.  No atrophy, fasciculations or abnormal movements.  No pronator drift.  Tone is normal.    MSRs:  Reflexes are 2+/4 throughout.  SENSORY:  Intact to vibration throughout.  COORDINATION/GAIT:  Normal finger-to- nose-finger. Gait narrow based and stable.   Data:  CT head and  cervical spine 04/15/2020:  No acute intracranial or cervical spine abnormalities.    IMPRESSION/PLAN: 1. Post-concussion headaches.  CT head and neck personally viewed.   - Agree with Cymbalta 30mg /d which he started yesterday and will be getting trigger point injections  - Recommend neck physiotherapy, when able.  He is getting MRI cervical spine tomorrow.   2. Episodic migraine without aura, well-controlled prior to fall occurring every 3 months.   - Continue Zomig 5mg  prn severe miraine  Return to clinic in 6 months.   Thank you for allowing me to participate in patient's care.  If I can answer any additional questions, I would be pleased to do so.    Sincerely,    Amyia Lodwick K. Posey Pronto, DO

## 2020-05-12 NOTE — Patient Instructions (Signed)
Return to clinic 6 months

## 2020-05-14 ENCOUNTER — Encounter: Payer: Self-pay | Admitting: Physical Medicine and Rehabilitation

## 2020-05-23 ENCOUNTER — Encounter: Payer: Self-pay | Admitting: Physical Medicine and Rehabilitation

## 2020-05-23 ENCOUNTER — Other Ambulatory Visit: Payer: Self-pay

## 2020-05-23 ENCOUNTER — Encounter
Payer: Worker's Compensation | Attending: Physical Medicine and Rehabilitation | Admitting: Physical Medicine and Rehabilitation

## 2020-05-23 VITALS — BP 155/100 | HR 86 | Temp 98.2°F | Ht 68.5 in | Wt 263.0 lb

## 2020-05-23 DIAGNOSIS — Y990 Civilian activity done for income or pay: Secondary | ICD-10-CM | POA: Insufficient documentation

## 2020-05-23 DIAGNOSIS — G43119 Migraine with aura, intractable, without status migrainosus: Secondary | ICD-10-CM | POA: Diagnosis not present

## 2020-05-23 DIAGNOSIS — F0781 Postconcussional syndrome: Secondary | ICD-10-CM | POA: Insufficient documentation

## 2020-05-23 DIAGNOSIS — M7918 Myalgia, other site: Secondary | ICD-10-CM | POA: Insufficient documentation

## 2020-05-23 NOTE — Progress Notes (Signed)
Pt is a 65 yr old R handed male with hx of migraines- rate- usually gone after vomited- takes Zomig prn for HA's.  Also has elevated liver issues- so on Actos- HTN and HLD-  Here for post concussive syndrome  Vomited last night- due to migraine triggered from neck pain.   Usually has pain on L, but last night was on R side last night.   Duloxetine on at night-   Been doing better since he saw me-  Took Skelaxin 1 day- 2 days- helped- a little- 20%.  Was doing well until yesterday Had a few HA's that lasted 5 minutes or so. When trying to do light chores.   Per pt, got MRI 1-2 weeks ago- Dr Saintclair Halsted said it was OK- and he wanted me to "steer the ship".  Mood has been better- overall.  Irritability- a little better- but still gets irritated easily.    Living daughter getting family upset with the way she was acting.   Set up for PT to start 3/8- at [facility].    Assessment: Pt is a 65 yr old R handed male with hx of migraines- rate- usually gone after vomited- takes Zomig prn for HA's.  Also has elevated liver issues- so on Actos- HTN and HLD-  Here for post concussive syndrome  Plan: 1. Duloxetine is not as needed.  Needs to take nightly no matter what- and after 7 days in a row, increase to 2 tablets nightly- for nerve pain and mood.   2. Patient here for trigger point injections for  Consent done and on chart.  Cleaned areas with alcohol and injected using a 27 gauge 1.5 inch needle  Injected 4.5 cc Using 1% Lidocaine with no EPI  Upper traps B/L Levators B/L Posterior scalenes B/L Middle scalenes B/L Splenius Capitus B/L x2 Pectoralis Major B/L Rhomboids B/L  Infraspinatus Teres Major/minor Thoracic paraspinals Lumbar paraspinals Other injections-    Patient's level of pain prior was 9/10 Current level of pain after injections is 1/10- and improved ROM  There was no bleeding or complications.  Patient was advised to drink a lot of water on day after  injections to flush system Will have increased soreness for 12-48 hours after injections.  Can use Lidocaine patches the day AFTER injections Can use theracane on day of injections in places didn't inject Can use heating pad 4-6 hours AFTER injections  3. Continue Skelaxin  As needed (metaxalone)  4. Continue out of work for a minimum of 3-6 months at this time. Feel long term, he will likely get back to work, but not at this time.   5. Theracane use 2-4 minutes pressure keep in place- no massage- 4 minutes on bigger muscles use YOUTUBE to find videos how ot use-   6. Showed pt how to do myofascial release- I/e pinch between thumb and 2nd finger to realx hand muscles- since hands stiff.   7. I think migraines are worse due to post concussive syndrome- and it can make location of migraine move-   8. Con't Duloxetine and Skelaxin and Zomig and Phenergan prn- for migraines triggered/worse due to post concussive syndrome.    9. Thinks if does PT- would benefit from neurorehab, not ortho therapy.    10. F/U in 4-6 weeks for injections.   11. I want you to have rest breaks during the day at home- brain rest- doesn't have to go to sleep, however you need to rest laying down.  No music that's  not calm and no TV during brain breaks- start with 3-4x/day- for 15 minutes- then as things improve, hopefully, will be able ot reduce them.   12. Will need trigger point injections at next visit.    I spent a total of  40 minutes on visit- taking with worker's Chemical engineer and d/w plan with pt.

## 2020-05-23 NOTE — Patient Instructions (Addendum)
Assessment: Pt is a 65 yr old R handed male with hx of migraines- rate- usually gone after vomited- takes Zomig prn for HA's.  Also has elevated liver issues- so on Actos- HTN and HLD-  Here for post concussive syndrome  Plan: 1. Duloxetine is not as needed.  Needs to take nightly no matter what- and after 7 days in a row, increase to 2 tablets nightly- for nerve pain and mood.   2. Patient here for trigger point injections for  Consent done and on chart.  Cleaned areas with alcohol and injected using a 27 gauge 1.5 inch needle  Injected 4.5 cc Using 1% Lidocaine with no EPI  Upper traps B/L Levators B/L Posterior scalenes B/L Middle scalenes B/L Splenius Capitus B/L x2 Pectoralis Major B/L Rhomboids B/L  Infraspinatus Teres Major/minor Thoracic paraspinals Lumbar paraspinals Other injections-    Patient's level of pain prior was 9/10 Current level of pain after injections is 1/10- and improved ROM  There was no bleeding or complications.  Patient was advised to drink a lot of water on day after injections to flush system Will have increased soreness for 12-48 hours after injections.  Can use Lidocaine patches the day AFTER injections Can use theracane on day of injections in places didn't inject Can use heating pad 4-6 hours AFTER injections  3. Continue Skelaxin  As needed (metaxalone)  4. Continue out of work for a minimum of 3-6 months at this time. Feel long term, he will likely get back to work, but not at this time.   5. Theracane use 2-4 minutes pressure keep in place- no massage- 4 minutes on bigger muscles use YOUTUBE to find videos how ot use-   6. Showed pt how to do myofascial release- I/e pinch between thumb and 2nd finger to realx hand muscles- since hands stiff.   7. I think migraines are worse due to post concussive syndrome- and it can make location of migraine move-   8. Con't Duloxetine and Skelaxin and Zomig and Phenergan prn- for migraines  triggered/worse due to post concussive syndrome.    9. Thinks if does PT- would benefit from neurorehab, not ortho therapy.    10. F/U in 4-6 weeks for injections.   11. I want you to have rest breaks during the day at home- brain rest- doesn't have to go to sleep, however you need to rest laying down.  No music that's not calm and no TV during brain breaks- start with 3-4x/day- for 15 minutes- then as things improve, hopefully, will be able ot reduce them.

## 2020-05-25 ENCOUNTER — Other Ambulatory Visit: Payer: Self-pay | Admitting: Internal Medicine

## 2020-05-25 NOTE — Telephone Encounter (Signed)
Please refill as per office routine med refill policy (all routine meds refilled for 3 mo or monthly per pt preference up to one year from last visit, then month to month grace period for 3 mo, then further med refills will have to be denied)  

## 2020-06-02 ENCOUNTER — Other Ambulatory Visit: Payer: Self-pay | Admitting: Physical Medicine and Rehabilitation

## 2020-06-17 ENCOUNTER — Ambulatory Visit: Payer: BC Managed Care – PPO | Admitting: Physical Medicine & Rehabilitation

## 2020-06-25 ENCOUNTER — Encounter: Payer: Self-pay | Admitting: Physical Medicine and Rehabilitation

## 2020-06-25 ENCOUNTER — Other Ambulatory Visit: Payer: Self-pay | Admitting: Physical Medicine and Rehabilitation

## 2020-06-25 ENCOUNTER — Encounter
Payer: Worker's Compensation | Attending: Physical Medicine and Rehabilitation | Admitting: Physical Medicine and Rehabilitation

## 2020-06-25 ENCOUNTER — Other Ambulatory Visit: Payer: Self-pay

## 2020-06-25 VITALS — BP 148/93 | HR 82 | Temp 98.3°F | Ht 68.5 in | Wt 259.6 lb

## 2020-06-25 DIAGNOSIS — G43119 Migraine with aura, intractable, without status migrainosus: Secondary | ICD-10-CM | POA: Insufficient documentation

## 2020-06-25 DIAGNOSIS — F0781 Postconcussional syndrome: Secondary | ICD-10-CM | POA: Insufficient documentation

## 2020-06-25 DIAGNOSIS — M7918 Myalgia, other site: Secondary | ICD-10-CM | POA: Diagnosis not present

## 2020-06-25 DIAGNOSIS — H8110 Benign paroxysmal vertigo, unspecified ear: Secondary | ICD-10-CM | POA: Insufficient documentation

## 2020-06-25 MED ORDER — TOPIRAMATE 50 MG PO TABS: 50.0000 mg | ORAL_TABLET | Freq: Every day | ORAL | 5 refills | Status: DC

## 2020-06-25 MED ORDER — NURTEC 75 MG PO TBDP: 75.0000 mg | ORAL_TABLET | ORAL | 5 refills | Status: DC | PRN

## 2020-06-25 MED ORDER — PRAZOSIN HCL 2 MG PO CAPS: 2.0000 mg | ORAL_CAPSULE | Freq: Every day | ORAL | 5 refills | Status: DC
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
## 2020-06-26 ENCOUNTER — Telehealth: Payer: Self-pay | Admitting: Physical Medicine and Rehabilitation

## 2020-06-26 NOTE — Telephone Encounter (Signed)
I have requested from the pharmacy that they fax [name] a prior auth notification so that we may forward it to the workman comp NCM.

## 2020-06-26 NOTE — Telephone Encounter (Signed)
Per NCM need a script that states to continue PT and what needs to be held off on currently.  Outline specifics.

## 2020-06-26 NOTE — Telephone Encounter (Signed)
I actually was hoping it would be covered by [employer]- can we check if they will cover the Nurtec for now, since his post concussive Has are CAUSING the migraines- thanks, ML FYI- I did discuss with worker's comp rep this week

## 2020-06-27 ENCOUNTER — Other Ambulatory Visit: Payer: Self-pay | Admitting: Internal Medicine

## 2020-06-27 NOTE — Telephone Encounter (Signed)
Please refill as per office routine med refill policy (all routine meds refilled for 3 mo or monthly per pt preference up to one year from last visit, then month to month grace period for 3 mo, then further med refills will have to be denied)  

## 2020-06-27 NOTE — Telephone Encounter (Signed)
Pharmacy ran it under [employer] and the medication was approved.

## 2020-06-30 ENCOUNTER — Telehealth: Payer: Self-pay | Admitting: *Deleted

## 2020-06-30 ENCOUNTER — Encounter: Payer: BC Managed Care – PPO | Admitting: Physical Therapy

## 2020-06-30 DIAGNOSIS — M5412 Radiculopathy, cervical region: Secondary | ICD-10-CM

## 2020-06-30 DIAGNOSIS — H8110 Benign paroxysmal vertigo, unspecified ear: Secondary | ICD-10-CM

## 2020-06-30 DIAGNOSIS — F0781 Postconcussional syndrome: Secondary | ICD-10-CM

## 2020-06-30 DIAGNOSIS — Y990 Civilian activity done for income or pay: Secondary | ICD-10-CM

## 2020-06-30 NOTE — Telephone Encounter (Signed)
[name] NCM called for clarification on PT/vestibular tx.  I have put in a new referral that will be sent to [facility]-- [name] previously known as [facility] and is not [facility].  We need to know what specific exercises you wanted him to avoid.  [name] thought he was to pause PT, but NCM says that is not correct.  A copy of this new referral needs to be sent to [facility] NCM  fx [phone].

## 2020-07-01 ENCOUNTER — Telehealth: Payer: Self-pay

## 2020-07-01 NOTE — Telephone Encounter (Signed)
We can address this tomorrow AM, since in hospital, however just FYI, I explained to pt to get checked by vestibular rehab in addition to his therapies.   I specifically asked him to avoid tilting his head forward and then  back which is usually used to relax some of the muscles of cervical flexion and extension, however it caused him to have more Ha's- .  Otherwise, I suggested if PT wanted to do neck exercises with him- otherwise, try a set of 5 reps of any OTHER exercise- see how he tolerates, and if doesn't get neck/head pain, can slowly progress.   Please, discuss further Wednesday AM and we can send this above recommendation to them as well.

## 2020-07-01 NOTE — Telephone Encounter (Signed)
Patient cannot tolerate Rx Topamax. Because it causes too much itching.  Also cannot tolerated Rx Prazosin. It causes vomiting, teeth pain & headaches.   He is NOT having any migraines.  Please advise. Call back ph [phone].

## 2020-07-02 NOTE — Telephone Encounter (Addendum)
New order placed to [facility] to include vestibular tx with instructions in Dr Florentina Jenny note.  Faxed copy to [facility] @ [phone] @ 11:09 am.

## 2020-07-03 ENCOUNTER — Encounter: Payer: Self-pay | Admitting: *Deleted

## 2020-07-03 NOTE — Telephone Encounter (Signed)
Opened in error

## 2020-07-09 NOTE — Telephone Encounter (Addendum)
Attempted to reach pt- l/m vague message on voicemail- saying don't want him taking meds that have side effects and call me back to discuss further and let him know I was off next week and clinic closed Friday for Easter.  Will make note that pt cannot take Topamax or Prazosin- and will find out if needs anything to replace either?

## 2020-07-23 ENCOUNTER — Other Ambulatory Visit: Payer: Self-pay | Admitting: Internal Medicine

## 2020-07-23 NOTE — Telephone Encounter (Signed)
Please refill as per office routine med refill policy (all routine meds refilled for 3 mo or monthly per pt preference up to one year from last visit, then month to month grace period for 3 mo, then further med refills will have to be denied)  

## 2020-08-01 ENCOUNTER — Ambulatory Visit: Payer: Worker's Compensation | Attending: Physical Medicine and Rehabilitation | Admitting: Physical Therapy

## 2020-08-01 ENCOUNTER — Encounter: Payer: Self-pay | Admitting: Physical Therapy

## 2020-08-01 ENCOUNTER — Other Ambulatory Visit: Payer: Self-pay

## 2020-08-01 DIAGNOSIS — H8111 Benign paroxysmal vertigo, right ear: Secondary | ICD-10-CM | POA: Insufficient documentation

## 2020-08-01 DIAGNOSIS — R42 Dizziness and giddiness: Secondary | ICD-10-CM | POA: Diagnosis present

## 2020-08-01 NOTE — Therapy (Signed)
[facility] [address] [place], [place], [address] Phone: [phone]   Fax:  [phone]  Physical Therapy Evaluation  Patient Details  Name: Ruben West MRN: [id number] Date of Birth: [birth year] Referring Provider (PT): Courtney Heys, MD   Encounter Date: 08/01/2020   PT End of Session - 08/01/20 1206    Visit Number 1    Number of Visits 12    Date for PT Re-Evaluation 10/30/20    Authorization Type Worker's Comp has approved 12 visits    PT Start Time [phone]    PT Stop Time 1020    PT Time Calculation (min) 41 min    Activity Tolerance Other (comment)   limited by dizziness   Behavior During Therapy [facility] for tasks assessed/performed           Past Medical History:  Diagnosis Date  . ALLERGIC RHINITIS 11/23/2006  . Back pain   . 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 FATTY LIVER DISEASE 05/09/2008  . GERD 04/04/2007   no per pt  . Headache    Migraines a few times a year 1993  . Heart murmur   . Hypercholesteremia   . HYPERGLYCEMIA 04/04/2007  . HYPERTENSION 11/23/2006  . Migraines   . NASH (nonalcoholic steatohepatitis)   . 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 PUD (peptic ulcer disease)     Past Surgical History:  Procedure Laterality Date  . COLONOSCOPY    . ELECTROCARDIOGRAM  02/21/2006  . KNEE SURGERY Left    2015  . VASECTOMY      There were no vitals filed for this visit.    Subjective Assessment - 08/01/20 0944    Subjective In January pt was walking across parking lot at work and slipped on ice and fell hitting his head; began to vomit and had a persistent HA.  Went to the ED and had imaging with no acute findings.  Pt still going to therapy for neck - [facility].  HA are better.  Dizziness began a month after concussion; pt rolled over to his side and experienced spinning.  Since then dizziness comes and goes.  No dizziness over the past 10 days.  Pt feels good and has been doing  yard work.    Pertinent History Concussion, back pain, fatty liver disease, migraines, heart murmur, hyperglycemia, HTN, peptic ulcer disease, L knee surgery    Limitations Walking    Diagnostic tests CT scan    Patient Stated Goals To get back to work    Currently in Pain? No/denies              [facility] PT Assessment - 08/01/20 6387      Assessment   Medical Diagnosis Vertigo    Referring Provider (PT) Courtney Heys, MD    Onset Date/Surgical Date 07/28/20    Prior Therapy yes - for neck      Precautions   Precautions Other (comment)    Precaution Comments back pain, fatty liver disease, migraines, heart murmur, hyperglycemia, HTN, peptic ulcer disease, L knee surgery      Balance Screen   Has the patient fallen in the past 6 months Yes    How many times? 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      ROM / Strength   AROM / PROM / Strength Strength      Strength   Overall Strength Within functional limits for tasks performed                  Vestibular Assessment - 08/01/20 0959      Symptom Behavior   Subjective history of current problem Looking to the L (or rotating head to R) pt experiences double vision.  Denies changes in hearing but does have new tinnitus.  HA and neck are getting better.  When pt experiences dizziness he will get pain on the R side of his head.    Type of Dizziness  Spinning    Frequency of Dizziness intermittent    Duration of Dizziness 1 minute    Symptom Nature Motion provoked;Positional    Aggravating Factors Rolling to right     Relieving Factors Slow movements    Progression of Symptoms Better      Oculomotor Exam   Oculomotor Alignment Normal    Spontaneous Absent    Gaze-induced  Absent    Smooth Pursuits Comment   significant dizziness   Saccades Comment   became very dizzy   Comment cover-uncover test triggered dizziness      Oculomotor Exam-Fixation Suppressed    Left Head Impulse unable to fully test; pt became dizzy    Right Head Impulse unable to fully test; pt became dizzy      Vestibulo-Ocular Reflex   VOR to Slow Head Movement Comment   mild dizziness   VOR Cancellation Comment   mild dizziness   Comment reports slight HA that resolves quickly      Positional Testing   Dix-Hallpike Dix-Hallpike Right;Dix-Hallpike Left    Horizontal Canal Testing Horizontal Canal Right;Horizontal Canal Left      Dix-Hallpike Right   Dix-Hallpike Right Duration 0    Dix-Hallpike Right Symptoms No nystagmus      Dix-Hallpike Left   Dix-Hallpike Left Duration 0    Dix-Hallpike Left Symptoms No nystagmus      Horizontal Canal Right   Horizontal Canal Right Duration 15 seconds    Horizontal Canal Right Symptoms Normal   worse to the R     Horizontal Canal Left   Horizontal Canal Left Duration 10 seconds    Horizontal Canal Left Symptoms Normal      Positional Sensitivities   Rolling Right Moderate dizziness    Rolling Left Mild dizziness              Objective measurements completed on examination: See above findings.               PT Education - 08/01/20 1206    Education Details clinical findings, PT POC and goals    Person(s) Educated Patient    Methods Explanation    Comprehension Verbalized understanding            PT Short Term Goals - 08/01/20 1215      PT SHORT TERM GOAL #1   Title Pt will participate in further assessment of vertigo with use of Frenzel lenses to block fixation and will tolerate indicated treatment    Time 6    Period Weeks  Status New     Target Date 09/15/20      PT SHORT TERM GOAL #2   Title Pt will participate in further assessment of motion sensitivity, VOR and sensory integration with MSQ, MCTSIB and DVA    Time 6    Period Weeks    Status New    Target Date 09/15/20      PT SHORT TERM GOAL #3   Title Pt will initiate vestibular HEP    Time 6    Period Weeks    Status New    Target Date 09/15/20             PT Long Term Goals - 08/01/20 1219      PT LONG TERM GOAL #1   Title Pt will demonstrate independence with final HEP    Time 12    Period Weeks    Status New    Target Date 10/30/20      PT LONG TERM GOAL #2   Title Pt will report no dizziness with supine <> sit and rolling to R side    Time 12    Period Weeks    Status New    Target Date 10/30/20      PT LONG TERM GOAL #3   Title Pt will demonstrate improved gaze stabilization as indicated by 2-3 line difference on DVA    Baseline TBD    Time 12    Period Weeks    Status New    Target Date 10/30/20      PT LONG TERM GOAL #4   Title Pt will demonstrate improved sensory integration as indicated by ability to perform all 4 conditions on MCTSIB 20-30 seconds    Baseline TBD    Time 12    Period Weeks    Status New    Target Date 10/30/20                  Plan - 08/01/20 1207    Clinical Impression Statement Pt is a 65 year old male referred to Neuro OPPT for evaluation of vertigo with post-concussive syndrome.  Pt's PMH is significant for the following: back pain, fatty liver disease, migraines, heart murmur, hyperglycemia, HTN, peptic ulcer disease, L knee surgery. The following deficits were noted during pt's exam: diplopia with L gaze, impaired LUE coordination, visual motion sensitivity, and pt reported vertigo with rolling to R of short duration but no nystagmus observed - pt may be suppressing nystagmus due to also experiencing mild vertigo rolling to L vs. motion sensitivity. Pt would benefit from skilled PT to address these  impairments and functional limitations to maximize functional mobility independence, return to work as a Administrator, and reduce falls risk.    Personal Factors and Comorbidities Comorbidity 3+    Comorbidities concussion, back pain, fatty liver disease, migraines, heart murmur, hyperglycemia, HTN, peptic ulcer disease, L knee surgery    Examination-Activity Limitations Bed Mobility;Transfers    Examination-Participation Restrictions Driving;Occupation    Stability/Clinical Decision Making Stable/Uncomplicated    Clinical Decision Making Low    Rehab Potential Good    PT Frequency 1x / week    PT Duration 12 weeks    PT Treatment/Interventions ADLs/Self Care Home Management;Canalith Repostioning;Functional mobility training;Therapeutic activities;Therapeutic exercise;Balance training;Neuromuscular re-education;Patient/family education;Vestibular;Visual/perceptual remediation/compensation    PT Next Visit Plan Pt is very visually sensitive and can set off a migraine so go slow!  check rolling with frenzel lenses - if no nystagmus noted please give repeated  rolling for habituation; if nystagmus treat for horizontal canal BPPV.  Assess MCTSIB, MSQ, DVA and reset LTG.  Initiate HEP - oculomotor, slow VOR, habituation.    Recommended Other Services Neuro-ophthalmology?    Consulted and Agree with Plan of Care Patient           Patient will benefit from skilled therapeutic intervention in order to improve the following deficits and impairments:  Dizziness,Impaired vision/preception  Visit Diagnosis: Dizziness and giddiness  BPPV (benign paroxysmal positional vertigo), right     Problem List Patient Active Problem List   Diagnosis Date Noted  . 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  . Myofascial pain 05/09/2020  . Vitamin D deficiency 12/04/2019  . Work related injury 02/06/2019  . Prolapsed lumbar disc 02/06/2019  . Lumbar pain 01/31/2019  . Erectile dysfunction 06/12/2018  . DDD  (degenerative disc disease), cervical 08/16/2017  . Lumbar radiculopathy 08/16/2017  . Myalgia 06/29/2017  . Right thyroid nodule 06/13/2017  . C6 radiculopathy 06/08/2017  . Cervical spine pain 06/07/2017  . Degenerative arthritis of knee, bilateral 01/01/2016  . Benign paroxysmal positional vertigo 12/10/2015  . Wellness examination 06/10/2015  . Screening for prostate cancer 06/10/2015  . Allergic rhinitis 06/10/2015  . Skin nodule 06/10/2015  . Screening examination for infectious disease 03/04/2014  . Obesity (BMI 30-39.9) 01/13/2014  . Numbness 01/12/2014  . Migraine with aura 01/12/2014  . Atypical chest pain 01/12/2014  . Hyperlipidemia 01/12/2014  . Migraine, unspecified, without mention of intractable migraine without mention of status migrainosus 06/30/2012  . Depression 05/22/2012  . Chronic meniscal tear of knee 12/22/2011  . FATTY LIVER DISEASE 05/09/2008  . 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 GERD 04/04/2007  . 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 HYPERGLYCEMIA 04/04/2007  . Essential hypertension 11/23/2006    Rico Junker, PT, DPT 08/01/20    12:23 PM    [facility] [address] [place], [place], [address] Phone: [phone]   Fax:  [phone]  Name: Ruben West MRN: [id number] Date of Birth: [birth year]

## 2020-08-07 ENCOUNTER — Ambulatory Visit: Payer: Worker's Compensation | Attending: Physical Medicine and Rehabilitation

## 2020-08-07 ENCOUNTER — Other Ambulatory Visit: Payer: Self-pay

## 2020-08-07 DIAGNOSIS — R42 Dizziness and giddiness: Secondary | ICD-10-CM | POA: Diagnosis present

## 2020-08-07 DIAGNOSIS — H8111 Benign paroxysmal vertigo, right ear: Secondary | ICD-10-CM | POA: Diagnosis present

## 2020-08-07 NOTE — Therapy (Addendum)
[facility] [address] [place], [place], [address] Phone: [phone]   Fax:  [phone]  Physical Therapy Treatment  Patient Details  Name: Ruben West MRN: [id number] Date of Birth: 1955/05/06 Referring Provider (PT): Courtney Heys, MD   Encounter Date: 08/07/2020   PT End of Session - 08/07/20 1616    Visit Number 2    Number of Visits 12    Date for PT Re-Evaluation 10/30/20    Authorization Type Worker's Comp has approved 12 visits    PT Start Time 1616    PT Stop Time 1658    PT Time Calculation (min) 42 min    Activity Tolerance Other (comment)   limited by dizziness   Behavior During Therapy [facility] for tasks assessed/performed           Past Medical History:  Diagnosis Date  . ALLERGIC RHINITIS 11/23/2006  . Back pain   . 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 FATTY LIVER DISEASE 05/09/2008  . GERD 04/04/2007   no per pt  . Headache    Migraines a few times a year 1993  . Heart murmur   . Hypercholesteremia   . HYPERGLYCEMIA 04/04/2007  . HYPERTENSION 11/23/2006  . Migraines   . NASH (nonalcoholic steatohepatitis)   . 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 PUD (peptic ulcer disease)     Past Surgical History:  Procedure Laterality Date  . COLONOSCOPY    . ELECTROCARDIOGRAM  02/21/2006  . KNEE SURGERY Left    2015  . VASECTOMY      There were no vitals filed for this visit.   Subjective Assessment - 08/07/20 1618    Subjective Patient reports that he has had two migraines this week. Also woke up with a HA this morning. Finished PT at [facility] for the neck, still doing some exercises for it at home. Still reports some dizziness rolling.    Pertinent History Concussion, back pain, fatty liver disease, migraines, heart murmur, hyperglycemia, HTN, peptic ulcer disease, L knee surgery    Limitations Walking    Diagnostic tests CT scan    Patient Stated Goals To get back to work     Currently in Pain? No/denies                08/07/20 0001  Positional Testing  Horizontal Canal Testing Horizontal Canal Right;Horizontal Canal Left  Horizontal Canal Right  Horizontal Canal Right Duration 60 secs  Horizontal Canal Right Symptoms Ageotrophic;Other (comment) (completed with Frenzel Goggles)  Horizontal Canal Left  Horizontal Canal Left Duration 55-60 seconds (increased nystagmus intensity)  Horizontal Canal Left Symptoms Ageotrophic (completed with Frenzel Goggles)        08/07/20 0001  Vestibular Treatment/Exercise  Vestibular Treatment Provided Canalith Repositioning  Canalith Repositioning Comment  OTHER  Comment Completed R Gufoni x 2 Reps. Improved symptoms with reduced symptoms reported during reassesment. Pt requiring rest breaks between treatment due to increased HA and mild nauseous sensation.                      PT Education - 08/07/20 1846    Education Details BPPV findings    Person(s) Educated Patient    Methods Explanation    Comprehension Verbalized understanding            PT Short Term Goals - 08/01/20 1215      PT SHORT TERM GOAL #1   Title Pt will participate in further assessment of  vertigo with use of Frenzel lenses to block fixation and will tolerate indicated treatment    Time 6    Period Weeks    Status New    Target Date 09/15/20      PT SHORT TERM GOAL #2   Title Pt will participate in further assessment of motion sensitivity, VOR and sensory integration with MSQ, MCTSIB and DVA    Time 6    Period Weeks    Status New    Target Date 09/15/20      PT SHORT TERM GOAL #3   Title Pt will initiate vestibular HEP    Time 6    Period Weeks    Status New    Target Date 09/15/20             PT Long Term Goals - 08/01/20 1219      PT LONG TERM GOAL #1   Title Pt will demonstrate independence with final HEP    Time 12    Period Weeks    Status New    Target Date 10/30/20      PT LONG TERM  GOAL #2   Title Pt will report no dizziness with supine <> sit and rolling to R side    Time 12    Period Weeks    Status New    Target Date 10/30/20      PT LONG TERM GOAL #3   Title Pt will demonstrate improved gaze stabilization as indicated by 2-3 line difference on DVA    Baseline TBD    Time 12    Period Weeks    Status New    Target Date 10/30/20      PT LONG TERM GOAL #4   Title Pt will demonstrate improved sensory integration as indicated by ability to perform all 4 conditions on MCTSIB 20-30 seconds    Baseline TBD    Time 12    Period Weeks    Status New    Target Date 10/30/20                 Plan - 08/07/20 1621    Clinical Impression Statement Today's skilled PT session included reassesment for BPPV with frenzel lenses, patient demonstrating B ageotrophic nystagmus with increased intensity with L horizontal roll test indiciating R Horizontal Canal Cupolithasis. Completed Gufoni x 2 reps, with improved symptoms noted. Increased time required between treatment due to symptoms. will conitnue to progress toward all LTGs.    Personal Factors and Comorbidities Comorbidity 3+    Comorbidities concussion, back pain, fatty liver disease, migraines, heart murmur, hyperglycemia, HTN, peptic ulcer disease, L knee surgery    Examination-Activity Limitations Bed Mobility;Transfers    Examination-Participation Restrictions Driving;Occupation    Stability/Clinical Decision Making Stable/Uncomplicated    Rehab Potential Good    PT Frequency 1x / week    PT Duration 12 weeks    PT Treatment/Interventions ADLs/Self Care Home Management;Canalith Repostioning;Functional mobility training;Therapeutic activities;Therapeutic exercise;Balance training;Neuromuscular re-education;Patient/family education;Vestibular;Visual/perceptual remediation/compensation    PT Next Visit Plan Pt is very visually sensitive and can set off a migraine so go slow! R Horizontal Canal cupolithiasis.  Reassess w/ Frenzel lenses, and treat as indicated.   Assess MCTSIB, MSQ, DVA and reset LTG.  Initiate HEP - oculomotor, slow VOR, habituation.    Consulted and Agree with Plan of Care Patient           Patient will benefit from skilled therapeutic intervention in order to improve the following deficits  and impairments:  Dizziness,Impaired vision/preception  Visit Diagnosis: Dizziness and giddiness  BPPV (benign paroxysmal positional vertigo), right     Problem List Patient Active Problem List   Diagnosis Date Noted  . 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  . Myofascial pain 05/09/2020  . Vitamin D deficiency 12/04/2019  . Work related injury 02/06/2019  . Prolapsed lumbar disc 02/06/2019  . Lumbar pain 01/31/2019  . Erectile dysfunction 06/12/2018  . DDD (degenerative disc disease), cervical 08/16/2017  . Lumbar radiculopathy 08/16/2017  . Myalgia 06/29/2017  . Right thyroid nodule 06/13/2017  . C6 radiculopathy 06/08/2017  . Cervical spine pain 06/07/2017  . Degenerative arthritis of knee, bilateral 01/01/2016  . Benign paroxysmal positional vertigo 12/10/2015  . Wellness examination 06/10/2015  . Screening for prostate cancer 06/10/2015  . Allergic rhinitis 06/10/2015  . Skin nodule 06/10/2015  . Screening examination for infectious disease 03/04/2014  . Obesity (BMI 30-39.9) 01/13/2014  . Numbness 01/12/2014  . Migraine with aura 01/12/2014  . Atypical chest pain 01/12/2014  . Hyperlipidemia 01/12/2014  . Migraine, unspecified, without mention of intractable migraine without mention of status migrainosus 06/30/2012  . Depression 05/22/2012  . Chronic meniscal tear of knee 12/22/2011  . FATTY LIVER DISEASE 05/09/2008  . 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 GERD 04/04/2007  . 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 HYPERGLYCEMIA 04/04/2007  . Essential hypertension 11/23/2006    Jones Bales, PT, DPT 08/08/2020, 3:12 PM  [facility] [address] [place], [place], [address] Phone: [phone]   Fax:  [phone]  Name: MICK TANGUMA MRN: [id number] Date of Birth: 12-27-1955

## 2020-08-12 ENCOUNTER — Other Ambulatory Visit: Payer: Self-pay

## 2020-08-12 ENCOUNTER — Ambulatory Visit: Payer: Worker's Compensation

## 2020-08-12 VITALS — BP 132/90

## 2020-08-12 DIAGNOSIS — R42 Dizziness and giddiness: Secondary | ICD-10-CM | POA: Diagnosis not present

## 2020-08-12 DIAGNOSIS — H8111 Benign paroxysmal vertigo, right ear: Secondary | ICD-10-CM

## 2020-08-12 NOTE — Therapy (Signed)
[place], [place], [address] Phone: [phone]   Fax:  [phone]  Physical Therapy Treatment  Patient Details  Name: Ruben West MRN: [id number] Date of Birth: 1955/10/14 Referring Provider (PT): Courtney Heys, MD   Encounter Date: 08/12/2020   PT End of Session - 08/12/20 1236    Visit Number 3    Number of Visits 12    Date for PT Re-Evaluation 10/30/20    Authorization Type Worker's Comp has approved 12 visits    PT Start Time 1233    PT Stop Time 1314    PT Time Calculation (min) 41 min    Activity Tolerance Patient tolerated treatment well    Behavior During Therapy [facility] for tasks assessed/performed           Past Medical History:  Diagnosis Date  . ALLERGIC RHINITIS 11/23/2006  . Back pain   . 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 FATTY LIVER DISEASE 05/09/2008  . GERD 04/04/2007   no per pt  . Headache    Migraines a few times a year 1993  . Heart murmur   . Hypercholesteremia   . HYPERGLYCEMIA 04/04/2007  . HYPERTENSION 11/23/2006  . Migraines   . NASH (nonalcoholic steatohepatitis)   . 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 PUD (peptic ulcer disease)     Past Surgical History:  Procedure Laterality Date  . COLONOSCOPY    . ELECTROCARDIOGRAM  02/21/2006  . KNEE SURGERY Left    2015  . VASECTOMY      Vitals:   08/12/20 1257  BP: 132/90     Subjective Assessment - 08/12/20 1234    Subjective Patient reports felt okay after last session, did have mild HA. Had a migraine on Sunday. Has not had the dizziness with rolling after last session.    Pertinent History Concussion, back pain, fatty liver disease, migraines, heart murmur, hyperglycemia, HTN, peptic ulcer disease, L knee surgery    Limitations Walking    Diagnostic tests CT scan    Patient Stated Goals To get back to work    Currently in Pain? No/denies                   Vestibular  Assessment - 08/12/20 0001      Positional Testing   Horizontal Canal Testing Horizontal Canal Right;Horizontal Canal Left      Horizontal Canal Right   Horizontal Canal Right Duration 50 secs    Horizontal Canal Right Symptoms Ageotrophic   with frenzel goggles     Horizontal Canal Left   Horizontal Canal Left Duration 50 secs    Horizontal Canal Left Symptoms Ageotrophic   with frenzel goggles           Vestibular Treatment/Exercise - 08/12/20 0001      Vestibular Treatment/Exercise   Vestibular Treatment Provided Canalith Repositioning    Canalith Repositioning Comment      OTHER   Comment Completed R Gufoni x 2 reps. Pt requiring rest breaks between treatment due to mild nauseous sensation. After treatment, completed reassesment with frenzel googless, no nystagmus noted.                  PT Short Term Goals - 08/01/20 1215      PT SHORT TERM GOAL #1   Title Pt will participate in further assessment of vertigo with use of Frenzel lenses to block fixation and will tolerate indicated treatment    Time  6    Period Weeks    Status New    Target Date 09/15/20      PT SHORT TERM GOAL #2   Title Pt will participate in further assessment of motion sensitivity, VOR and sensory integration with MSQ, MCTSIB and DVA    Time 6    Period Weeks    Status New    Target Date 09/15/20      PT SHORT TERM GOAL #3   Title Pt will initiate vestibular HEP    Time 6    Period Weeks    Status New    Target Date 09/15/20             PT Long Term Goals - 08/01/20 1219      PT LONG TERM GOAL #1   Title Pt will demonstrate independence with final HEP    Time 12    Period Weeks    Status New    Target Date 10/30/20      PT LONG TERM GOAL #2   Title Pt will report no dizziness with supine <> sit and rolling to R side    Time 12    Period Weeks    Status New    Target Date 10/30/20      PT LONG TERM GOAL #3   Title Pt will demonstrate improved gaze stabilization as  indicated by 2-3 line difference on DVA    Baseline TBD    Time 12    Period Weeks    Status New    Target Date 10/30/20      PT LONG TERM GOAL #4   Title Pt will demonstrate improved sensory integration as indicated by ability to perform all 4 conditions on MCTSIB 20-30 seconds    Baseline TBD    Time 12    Period Weeks    Status New    Target Date 10/30/20                 Plan - 08/12/20 1347    Clinical Impression Statement Completed reassesment of horizontal canal BPPV with frenzel goggle. Patient demo continued ageotrophic nystagmus. Continued CRM with R Gufonia x 2 reps, with increased symptoms and nauseous requiring time between treatment. Completed reassesment with Frenzel after CRN, with no nystagmus noted. Will continue to assess and treat as indicated.    Personal Factors and Comorbidities Comorbidity 3+    Comorbidities concussion, back pain, fatty liver disease, migraines, heart murmur, hyperglycemia, HTN, peptic ulcer disease, L knee surgery    Examination-Activity Limitations Bed Mobility;Transfers    Examination-Participation Restrictions Driving;Occupation    Stability/Clinical Decision Making Stable/Uncomplicated    Rehab Potential Good    PT Frequency 1x / week    PT Duration 12 weeks    PT Treatment/Interventions ADLs/Self Care Home Management;Canalith Repostioning;Functional mobility training;Therapeutic activities;Therapeutic exercise;Balance training;Neuromuscular re-education;Patient/family education;Vestibular;Visual/perceptual remediation/compensation    PT Next Visit Plan Pt is very visually sensitive and can set off a migraine so go slow! R Horizontal Canal cupolithiasis. Reassess w/ Frenzel lenses, and treat as indicated.  Assess MCTSIB, MSQ, DVA and reset LTG.  Initiate HEP - oculomotor, slow VOR, habituation.    Consulted and Agree with Plan of Care Patient           Patient will benefit from skilled therapeutic intervention in order to improve  the following deficits and impairments:  Dizziness,Impaired vision/preception  Visit Diagnosis: Dizziness and giddiness  BPPV (benign paroxysmal positional vertigo), right     Problem  List Patient Active Problem List   Diagnosis Date Noted  . 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  . Myofascial pain 05/09/2020  . Vitamin D deficiency 12/04/2019  . Work related injury 02/06/2019  . Prolapsed lumbar disc 02/06/2019  . Lumbar pain 01/31/2019  . Erectile dysfunction 06/12/2018  . DDD (degenerative disc disease), cervical 08/16/2017  . Lumbar radiculopathy 08/16/2017  . Myalgia 06/29/2017  . Right thyroid nodule 06/13/2017  . C6 radiculopathy 06/08/2017  . Cervical spine pain 06/07/2017  . Degenerative arthritis of knee, bilateral 01/01/2016  . Benign paroxysmal positional vertigo 12/10/2015  . Wellness examination 06/10/2015  . Screening for prostate cancer 06/10/2015  . Allergic rhinitis 06/10/2015  . Skin nodule 06/10/2015  . Screening examination for infectious disease 03/04/2014  . Obesity (BMI 30-39.9) 01/13/2014  . Numbness 01/12/2014  . Migraine with aura 01/12/2014  . Atypical chest pain 01/12/2014  . Hyperlipidemia 01/12/2014  . Migraine, unspecified, without mention of intractable migraine without mention of status migrainosus 06/30/2012  . Depression 05/22/2012  . Chronic meniscal tear of knee 12/22/2011  . FATTY LIVER DISEASE 05/09/2008  . 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 GERD 04/04/2007  . 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 HYPERGLYCEMIA 04/04/2007  . Essential hypertension 11/23/2006    Jones Bales, PT, DPT 08/12/2020, 1:50 PM  [place] [address] [place], [place], [address] Phone: [phone]   Fax:  [phone]  Name: Ruben West MRN: [id number] Date of Birth: 01-May-1955

## 2020-08-21 ENCOUNTER — Encounter: Payer: Self-pay | Admitting: Physical Therapy

## 2020-08-21 ENCOUNTER — Other Ambulatory Visit: Payer: Self-pay

## 2020-08-21 ENCOUNTER — Ambulatory Visit: Payer: Worker's Compensation | Admitting: Physical Therapy

## 2020-08-21 DIAGNOSIS — R42 Dizziness and giddiness: Secondary | ICD-10-CM

## 2020-08-21 NOTE — Therapy (Signed)
[facility] [address] [place], [place], [address] Phone: [phone]   Fax:  [phone]  Physical Therapy Treatment  Patient Details  Name: Ruben West MRN: [id number] Date of Birth: [birth year] Referring Provider (PT): Courtney Heys, MD   Encounter Date: 08/21/2020   PT End of Session - 08/21/20 1541    Visit Number 4    Number of Visits 12    Date for PT Re-Evaluation 10/30/20    Authorization Type Worker's Comp has approved 12 visits    PT Start Time 1450    PT Stop Time 1530    PT Time Calculation (min) 40 min    Activity Tolerance Patient tolerated treatment well    Behavior During Therapy [facility] for tasks assessed/performed           Past Medical History:  Diagnosis Date  . ALLERGIC RHINITIS 11/23/2006  . Back pain   . 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 FATTY LIVER DISEASE 05/09/2008  . GERD 04/04/2007   no per pt  . Headache    Migraines a few times a year 1993  . Heart murmur   . Hypercholesteremia   . HYPERGLYCEMIA 04/04/2007  . HYPERTENSION 11/23/2006  . Migraines   . NASH (nonalcoholic steatohepatitis)   . 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 PUD (peptic ulcer disease)     Past Surgical History:  Procedure Laterality Date  . COLONOSCOPY    . ELECTROCARDIOGRAM  02/21/2006  . KNEE SURGERY Left    2015  . VASECTOMY      There were no vitals filed for this visit.   Subjective Assessment - 08/21/20 1456    Subjective Still having some migraines - thinks it might be the weather.  Has a HA today.  No dizziness since last session but would like to re-check to be sure.    Pertinent History Concussion, back pain, fatty liver disease, migraines, heart murmur, hyperglycemia, HTN, peptic ulcer disease, L knee surgery    Limitations Walking    Diagnostic tests CT scan    Patient Stated Goals To get back to work    Currently in Pain? Yes                   Vestibular  Assessment - 08/21/20 1457      Visual Acuity   Static 8    Dynamic 4   4 line difference, slightly dizzy     Positional Testing   Horizontal Canal Testing Horizontal Canal Right;Horizontal Canal Left   Use of Frenzel Lenses     Horizontal Canal Right   Horizontal Canal Right Duration 0    Horizontal Canal Right Symptoms Normal      Horizontal Canal Left   Horizontal Canal Left Duration 0    Horizontal Canal Left Symptoms Normal      Environmental education officer Comment MCTSIB: 30 seconds for condition 1, 2, 3.  2 seconds for Condition 4 - dizziness      Positional Sensitivities   Sit to Supine No dizziness    Supine to Left Side No dizziness    Supine to Right Side No dizziness    Supine to Sitting No dizziness    Nose to Right Knee No dizziness    Right Knee to Sitting No dizziness    Nose to Left Knee No dizziness    Left Knee to Sitting No dizziness    Head Turning x 5 No dizziness    Head  Nodding x 5 No dizziness    Pivot Right in Standing No dizziness    Pivot Left in Standing No dizziness    Rolling Right No dizziness    Rolling Left No dizziness                     Vestibular Treatment/Exercise - 08/21/20 1516      Vestibular Treatment/Exercise   Vestibular Treatment Provided Gaze    Gaze Exercises X1 Viewing Horizontal;X1 Viewing Vertical      X1 Viewing Horizontal   Foot Position standing feet apart    Reps 2    Comments 45 seconds >> decreased to 30 due to symptoms and LOB      X1 Viewing Vertical   Foot Position standing feet apart    Reps 2    Comments 12 then 20 seconds; increased symptoms with vertical              Balance Exercises - 08/21/20 1539      Balance Exercises: Standing   Standing Eyes Closed Narrow base of support (BOS);Wide (BOA);Head turns;Foam/compliant surface;Solid surface;Other reps (comment);Limitations    Standing Eyes Closed Limitations feet together on solid surface with head nods/turns x 10 each, no LOB  or dizziness.  Added compliant foam, feet apart.  Focused on holding balance, no head turns due to LOB             PT Education - 08/21/20 1540    Education Details BPPV resolved, other clinical findings, initiated HEP    Person(s) Educated Patient    Methods Explanation;Demonstration;Handout    Comprehension Verbalized understanding;Returned demonstration            PT Short Term Goals - 08/21/20 1545      PT SHORT TERM GOAL #1   Title Pt will participate in further assessment of vertigo with use of Frenzel lenses to block fixation and will tolerate indicated treatment    Time 6    Period Weeks    Status Achieved    Target Date 09/15/20      PT SHORT TERM GOAL #2   Title Pt will participate in further assessment of motion sensitivity, VOR and sensory integration with MSQ, MCTSIB and DVA    Time 6    Period Weeks    Status Achieved    Target Date 09/15/20      PT SHORT TERM GOAL #3   Title Pt will initiate vestibular HEP    Time 6    Period Weeks    Status Achieved    Target Date 09/15/20             PT Long Term Goals - 08/21/20 1545      PT LONG TERM GOAL #1   Title Pt will demonstrate independence with final HEP  (LTG due 10/30/20)    Time 12    Period Weeks    Status New      PT LONG TERM GOAL #2   Title Pt will report no dizziness with supine <> sit and rolling to R side    Time 12    Period Weeks    Status Achieved      PT LONG TERM GOAL #3   Title Pt will demonstrate improved gaze stabilization as indicated by 2-3 line difference on DVA    Baseline (8, 4) 4 line difference    Time 12    Period Weeks    Status Revised  PT LONG TERM GOAL #4   Title Pt will demonstrate improved sensory integration as indicated by ability to perform all 4 conditions on MCTSIB 20-30 seconds    Baseline 2-3 seconds on condition 4    Time 12    Period Weeks    Status New                 Plan - 08/21/20 1541    Clinical Impression Statement Utilizing  Frenzel's performed assessment of BPPV; no nystagmus or vertigo noted indicating resolution of BPPV.  Performed further vestibular and balance assessment with MSQ, DVA and MCTSIB.  MSQ was normal, DVA demonstrated 4 line difference indicating impaired VOR and pt unable to maintain condition 4 of MCTSIB indicating vestibular dysfunction.  Initiated HEP focusing on VOR and corner balance for multi-sensory integration.  Will continue to progress.    Personal Factors and Comorbidities Comorbidity 3+    Comorbidities concussion, back pain, fatty liver disease, migraines, heart murmur, hyperglycemia, HTN, peptic ulcer disease, L knee surgery    Examination-Activity Limitations Bed Mobility;Transfers    Examination-Participation Restrictions Driving;Occupation    Stability/Clinical Decision Making Stable/Uncomplicated    Rehab Potential Good    PT Frequency 1x / week    PT Duration 12 weeks    PT Treatment/Interventions ADLs/Self Care Home Management;Canalith Repostioning;Functional mobility training;Therapeutic activities;Therapeutic exercise;Balance training;Neuromuscular re-education;Patient/family education;Vestibular;Visual/perceptual remediation/compensation    PT Next Visit Plan Pt is very visually sensitive and can set off a migraine so go slow! Update and progress HEP; balance on compliant surfaces without vision.  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    Consulted and Agree with Plan of Care Patient           Patient will benefit from skilled therapeutic intervention in order to improve the following deficits and impairments:  Dizziness,Impaired vision/preception  Visit Diagnosis: Dizziness and giddiness     Problem List Patient Active Problem List   Diagnosis Date Noted  . 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  . Myofascial pain 05/09/2020  . Vitamin D deficiency 12/04/2019  . Work related injury 02/06/2019  . Prolapsed lumbar disc 02/06/2019  . Lumbar pain 01/31/2019  .  Erectile dysfunction 06/12/2018  . DDD (degenerative disc disease), cervical 08/16/2017  . Lumbar radiculopathy 08/16/2017  . Myalgia 06/29/2017  . Right thyroid nodule 06/13/2017  . C6 radiculopathy 06/08/2017  . Cervical spine pain 06/07/2017  . Degenerative arthritis of knee, bilateral 01/01/2016  . Benign paroxysmal positional vertigo 12/10/2015  . Wellness examination 06/10/2015  . Screening for prostate cancer 06/10/2015  . Allergic rhinitis 06/10/2015  . Skin nodule 06/10/2015  . Screening examination for infectious disease 03/04/2014  . Obesity (BMI 30-39.9) 01/13/2014  . Numbness 01/12/2014  . Migraine with aura 01/12/2014  . Atypical chest pain 01/12/2014  . Hyperlipidemia 01/12/2014  . Migraine, unspecified, without mention of intractable migraine without mention of status migrainosus 06/30/2012  . Depression 05/22/2012  . Chronic meniscal tear of knee 12/22/2011  . FATTY LIVER DISEASE 05/09/2008  . 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 GERD 04/04/2007  . 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 HYPERGLYCEMIA 04/04/2007  . Essential hypertension 11/23/2006   Rico Junker, PT, DPT 08/21/20    3:47 PM    [facility] [address] [place], [place], [address] Phone: [phone]   Fax:  [phone]  Name: Ruben West MRN: [id number] Date of Birth: 1956/01/20

## 2020-08-21 NOTE — Patient Instructions (Signed)
Gaze Stabilization: Standing Feet Apart    Feet shoulder width apart, keeping eyes on target on wall __3__ feet away, tilt head down 15-30 and move head side to side for __30__ seconds. Repeat while moving head up and down for _20___ seconds. Do __2__ sessions per day.   Feet Apart (Compliant Surface) Head Motion - Eyes Closed    Stand on compliant surface: Cushion with feet shoulder width apart. Close eyes and hold your balance for 15-20 seconds. Just do this for a few days until your balance improves and then try to add in a few slow head turns. Repeat 2 times per session. Do 2 sessions per day.

## 2020-08-26 ENCOUNTER — Other Ambulatory Visit: Payer: Self-pay

## 2020-08-26 ENCOUNTER — Ambulatory Visit: Payer: Worker's Compensation | Admitting: Physical Therapy

## 2020-08-26 DIAGNOSIS — R42 Dizziness and giddiness: Secondary | ICD-10-CM

## 2020-08-26 NOTE — Therapy (Signed)
[facility] [address] [place], [place], [address] Phone: [phone]   Fax:  [phone]  Physical Therapy Treatment  Patient Details  Name: Ruben West MRN: [id number] Date of Birth: February 05, 1956 Referring Provider (PT): Courtney Heys, MD   Encounter Date: 08/26/2020   PT End of Session - 08/26/20 1629    Visit Number 5    Number of Visits 12    Date for PT Re-Evaluation 10/30/20    Authorization Type Worker's Comp has approved 12 visits    PT Start Time 1319    PT Stop Time 1400    PT Time Calculation (min) 41 min    Activity Tolerance Patient tolerated treatment well    Behavior During Therapy [facility] for tasks assessed/performed           Past Medical History:  Diagnosis Date  . ALLERGIC RHINITIS 11/23/2006  . Back pain   . 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 FATTY LIVER DISEASE 05/09/2008  . GERD 04/04/2007   no per pt  . Headache    Migraines a few times a year 1993  . Heart murmur   . Hypercholesteremia   . HYPERGLYCEMIA 04/04/2007  . HYPERTENSION 11/23/2006  . Migraines   . NASH (nonalcoholic steatohepatitis)   . 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 PUD (peptic ulcer disease)     Past Surgical History:  Procedure Laterality Date  . COLONOSCOPY    . ELECTROCARDIOGRAM  02/21/2006  . KNEE SURGERY Left    2015  . VASECTOMY      There were no vitals filed for this visit.   Subjective Assessment - 08/26/20 1321    Subjective Had slight HA this weekend but due to allergies; worked out in the yard a lot this weekend, no dizziness.  Balance is getting better.    Pertinent History Concussion, back pain, fatty liver disease, migraines, heart murmur, hyperglycemia, HTN, peptic ulcer disease, L knee surgery    Limitations Walking    Diagnostic tests CT scan    Patient Stated Goals To get back to work    Currently in Pain? 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  Constantly turning head to check traffic, turn or back up.  Seat moves to prevent impact when hitting bumps in the road.  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  Has to perform large step ups to get into and out of truck.  Will work to include in therapy sessions.      Exercises   Exercises Knee/Hip      Knee/Hip Exercises: Standing   Forward Step Up Right;Left;1 set;10 reps;Hand Hold: 1;Step Height: 8"    Forward Step Up Limitations to simulate stepping up in truck.  Pt would intermittently take too short of a step when stepping forwards or backwards and required min A for safety.  Poor coordination of stepping.  No dizziness           Vestibular Treatment/Exercise - 08/26/20 1326      Vestibular Treatment/Exercise   Vestibular  Treatment Provided Gaze    Gaze Exercises X1 Viewing Horizontal;X1 Viewing Vertical      X1 Viewing Horizontal   Foot Position standing feet apart    Reps 3    Comments 30 >> 45 seconds, mild symptoms      X1 Viewing Vertical   Foot Position standing feet apart    Reps 3    Comments 30 >> 45 seconds, mild symptoms              Balance Exercises - 08/26/20 1404      Balance Exercises: Standing   Standing Eyes Opened --    Standing Eyes Closed Wide (BOA);Head turns;Foam/compliant surface;Other reps (comment);Limitations    Standing Eyes Closed Limitations standing on foam, 10 reps head turns and nods, mild LOB               PT Short Term Goals - 08/21/20 1545      PT SHORT TERM GOAL #1   Title Pt will participate in further assessment of vertigo with use of Frenzel lenses to block fixation and will tolerate indicated treatment    Time 6    Period Weeks     Status Achieved    Target Date 09/15/20      PT SHORT TERM GOAL #2   Title Pt will participate in further assessment of motion sensitivity, VOR and sensory integration with MSQ, MCTSIB and DVA    Time 6    Period Weeks    Status Achieved    Target Date 09/15/20      PT SHORT TERM GOAL #3   Title Pt will initiate vestibular HEP    Time 6    Period Weeks    Status Achieved    Target Date 09/15/20             PT Long Term Goals - 08/21/20 1545      PT LONG TERM GOAL #1   Title Pt will demonstrate independence with final HEP  (LTG due 10/30/20)    Time 12    Period Weeks    Status New      PT LONG TERM GOAL #2   Title Pt will report no dizziness with supine <> sit and rolling to R side    Time 12    Period Weeks    Status Achieved      PT LONG TERM GOAL #3   Title Pt will demonstrate improved gaze stabilization as indicated by 2-3 line difference on DVA    Baseline (8, 4) 4 line difference    Time 12    Period Weeks    Status Revised      PT LONG TERM GOAL #4   Title Pt will demonstrate improved sensory integration as indicated by ability to perform all 4 conditions on MCTSIB 20-30 seconds    Baseline 2-3 seconds on condition 4    Time 12    Period Weeks    Status New                 Plan - 08/26/20 1629    Clinical Impression Statement Dizziness continues to be resolved and pt does not experience any residual symptoms when performing more vigorous activities outside.  Continued to progress VOR and sensory integration training and will begin to incorporate more work related activities into therapy sessions.  Pt also tolerating more activity with fewer migraine occurences.    Personal Factors and Comorbidities Comorbidity 3+    Comorbidities concussion,  back pain, fatty liver disease, migraines, heart murmur, hyperglycemia, HTN, peptic ulcer disease, L knee surgery    Examination-Activity Limitations Bed Mobility;Transfers    Examination-Participation  Restrictions Driving;Occupation    Stability/Clinical Decision Making Stable/Uncomplicated    Rehab Potential Good    PT Frequency 1x / week    PT Duration 12 weeks    PT Treatment/Interventions ADLs/Self Care Home Management;Canalith Repostioning;Functional mobility training;Therapeutic activities;Therapeutic exercise;Balance training;Neuromuscular re-education;Patient/family education;Vestibular;Visual/perceptual remediation/compensation    PT Next Visit Plan Dizziness has resolved.  Now needs to work on more work related activities: bouncing in sitting (physioball) with head turns, step ups/down on TALL steps, 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  Work towards progressing gaze stabilization to walking forwards/backwards.    Consulted and Agree with Plan of Care Patient           Patient will benefit from skilled therapeutic intervention in order to improve the following deficits and impairments:  Dizziness,Impaired vision/preception  Visit Diagnosis: Dizziness and giddiness     Problem List Patient Active Problem List   Diagnosis Date Noted  . 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  . Myofascial pain 05/09/2020  . Vitamin D deficiency 12/04/2019  . Work related injury 02/06/2019  . Prolapsed lumbar disc 02/06/2019  . Lumbar pain 01/31/2019  . Erectile dysfunction 06/12/2018  . DDD (degenerative disc disease), cervical 08/16/2017  . Lumbar radiculopathy 08/16/2017  . Myalgia 06/29/2017  . Right thyroid nodule 06/13/2017  . C6 radiculopathy 06/08/2017  . Cervical spine pain 06/07/2017  . Degenerative arthritis of knee, bilateral 01/01/2016  . Benign paroxysmal positional vertigo 12/10/2015  . Wellness examination 06/10/2015  . Screening for prostate cancer 06/10/2015  . Allergic rhinitis 06/10/2015  . Skin nodule 06/10/2015  . Screening examination for infectious disease 03/04/2014  . Obesity (BMI 30-39.9)  01/13/2014  . Numbness 01/12/2014  . Migraine with aura 01/12/2014  . Atypical chest pain 01/12/2014  . Hyperlipidemia 01/12/2014  . Migraine, unspecified, without mention of intractable migraine without mention of status migrainosus 06/30/2012  . Depression 05/22/2012  . Chronic meniscal tear of knee 12/22/2011  . FATTY LIVER DISEASE 05/09/2008  . 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 GERD 04/04/2007  . 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 HYPERGLYCEMIA 04/04/2007  . Essential hypertension 11/23/2006    Rico Junker, PT, DPT 08/26/20    4:33 PM    [facility] [address] [place], [place], [address] Phone: [phone]   Fax:  [phone]  Name: LAWARENCE MEEK MRN: [id number] Date of Birth: Sep 27, 1955

## 2020-08-26 NOTE — Patient Instructions (Signed)
Gaze Stabilization: Standing Feet Apart    Feet shoulder width apart, keeping eyes on target on wall __3__ feet away, tilt head down 15-30 and move head side to side for __45__ seconds. Repeat while moving head up and down for _45_ seconds. Do __2__ sessions per day.   Feet Apart (Compliant Surface) Head Motion - Eyes Closed    Stand on compliant surface: Cushion with feet shoulder width apart. Close eyes and hold your balance while turning your head left and right, 10 times slowly.  Rest and then repeat moving your head up and down, 10 times slowly.  Repeat 2 times per session. Do 2 sessions per day.

## 2020-08-27 ENCOUNTER — Encounter: Payer: Self-pay | Admitting: Physical Medicine and Rehabilitation

## 2020-08-27 ENCOUNTER — Encounter
Payer: Worker's Compensation | Attending: Physical Medicine and Rehabilitation | Admitting: Physical Medicine and Rehabilitation

## 2020-08-27 VITALS — BP 144/84 | HR 75 | Temp 98.0°F | Ht 68.5 in | Wt 266.4 lb

## 2020-08-27 DIAGNOSIS — F0781 Postconcussional syndrome: Secondary | ICD-10-CM | POA: Insufficient documentation

## 2020-08-27 DIAGNOSIS — M7918 Myalgia, other site: Secondary | ICD-10-CM | POA: Diagnosis not present

## 2020-08-27 DIAGNOSIS — G43119 Migraine with aura, intractable, without status migrainosus: Secondary | ICD-10-CM | POA: Diagnosis not present

## 2020-08-27 MED ORDER — LIDOCAINE 5 % EX PTCH: 2.0000 | MEDICATED_PATCH | CUTANEOUS | 5 refills | Status: DC

## 2020-08-27 NOTE — Progress Notes (Signed)
Subjective:    Patient ID: Ruben West, male    DOB: Dec 04, 1955, 65 y.o.   MRN: [id number]  HPI   Pt is a 65 yr old R handed male with hx of migraines- rate- usually gone after vomited- takes Zomig prn for HA's.  Also has elevated liver function- so on Actos- HTN and HLD-  Here for  headache control.  Here for f/u on post concussive syndrome.  Injury 04/15/20.   Ain 0/10 right now, but still having migraines.   Still having migraines sometimes.  Had 1 ~ 1 week ago and 1 sometime in April - came with burning sensation- that lasted 1 a few minutes.   Has used 6/8 tablets of Nurtec.   Has last 2 of them, by putting in pocket. So has actually used 4 of them.  Nurtec helps more than before/other meds/Zomig.  Makes a little sleepy but can get through that. Helps a lot more than Zomig.   Anxiety doing much better- no more nightmares.   Still doing PT- balance up to 80%- really likes PT- only going 1x/week. Working on not setting off migraines with therapy.  Usually gone 10 days and 14 hours days.  They plan to do some work hardening.   Can read eye chart real well and then moved his head around- chart very blurry after that. .   Has started standing on pillow to help keep upright to help balance. Does at home.   Used to have migraines every 6 months or so.  Starting to spread out more since post concussive syndrome.   Trigger point injections really worked when did them.   Has been using theracane- when feels neck pain or migraine coming on.   Has lost 2 lbs- and working on losing weight.   All kids grown- stands to eat.  Doesn't watch TV much.  Grandson about to graduate- high school.  Doing some chores outside, but notes uneven ground is harder. A little harder to walk on.    Takes Skelaxin prn.  Off Cymbalta and all other meds except Nurtec and skelaxin prn.    Pain Inventory Average Pain 0 Pain Right Now 0 My pain is intermittent, sharp, stabbing and  aching  LOCATION OF PAIN  head  BOWEL Number of stools per week: 7 Oral laxative use No  Type of laxative na Enema or suppository use No  History of colostomy No  Incontinent No   BLADDER Normal In and out cath, frequency na Able to self cath No  Bladder incontinence No  Frequent urination No  Leakage with coughing No  Difficulty starting stream No  Incomplete bladder emptying No    Mobility do you drive?  yes  Function employed # of hrs/week . Do you have any goals in this area?  yes  Neuro/Psych dizziness anxiety  Prior Studies Any changes since last visit?  no  Physicians involved in your care Any changes since last visit?  no   Family History  Problem Relation Age of Onset  . Heart attack Mother        MI, deceased 78  . Seizures Mother   . Emphysema Father        Deceased, 44  . Healthy Daughter        x2  . Seizures Grandchild   . Cancer Neg Hx        no cancer in immediate family  . Colon cancer Neg Hx   . Esophageal cancer Neg  Hx   . Rectal cancer Neg Hx   . Stomach cancer Neg Hx   . Colon polyps Neg Hx    Social History   Socioeconomic History  . Marital status: Married    Spouse name: Millie  . Number of children: 2  . Years of education: 11 grade  . Highest education level: Not on file  Occupational History  . Occupation: Therapist, sports: [employer]  Tobacco Use  . Smoking status: Light Tobacco Smoker    Types: Cigars  . Smokeless tobacco: Never Used  . Tobacco comment: smokes maybe 5 cigars a year  Vaping Use  . Vaping Use: Never used  Substance and Sexual Activity  . Alcohol use: Yes    Alcohol/week: 0.0 standard drinks    Comment: Rarely  . Drug use: No  . Sexual activity: Yes  Other Topics Concern  . Not on file  Social History Narrative   Lives with wife in a one story home.  Has 2 grandchildren living with him.     Works as a Administrator.     Education: 10th grade.        Right hand    Social  Determinants of Health   Financial Resource Strain: Not on file  Food Insecurity: Not on file  Transportation Needs: Not on file  Physical Activity: Not on file  Stress: Not on file  Social Connections: Not on file   Past Surgical History:  Procedure Laterality Date  . COLONOSCOPY    . ELECTROCARDIOGRAM  02/21/2006  . KNEE SURGERY Left    2015  . VASECTOMY     Past Medical History:  Diagnosis Date  . ALLERGIC RHINITIS 11/23/2006  . Back pain   . 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 FATTY LIVER DISEASE 05/09/2008  . GERD 04/04/2007   no per pt  . Headache    Migraines a few times a year 1993  . Heart murmur   . Hypercholesteremia   . HYPERGLYCEMIA 04/04/2007  . HYPERTENSION 11/23/2006  . Migraines   . NASH (nonalcoholic steatohepatitis)   . 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 PUD (peptic ulcer disease)    BP (!) 144/84   Pulse 75   Temp 98 F (36.7 C)   Ht 5' 8.5" (1.74 m)   Wt 266 lb 6.4 oz (120.8 kg)   SpO2 95%   BMI 39.92 kg/m   Opioid Risk Score:   Fall Risk Score:  `1  Depression screen PHQ 2/9  Depression screen [facility] 2/9 06/25/2020 05/09/2020 12/04/2019 12/04/2019 10/30/2018 10/30/2018 06/12/2018  Decreased Interest 2 2 0 0 0 0 0  Down, Depressed, Hopeless 2 2 1  0 1 0 1  PHQ - 2 Score 4 4 1  0 1 0 1  Altered sleeping - 0 - 0 - 0 -  Tired, decreased energy - 2 - 0 - 0 -  Change in appetite - 2 - 0 - 0 -  Feeling bad or failure about yourself  - 0 - 0 - 0 -  Trouble concentrating - 2 - 0 - 0 -  Moving slowly or fidgety/restless - 2 - 0 - 0 -  Suicidal thoughts - 0 - 0 - 0 -  PHQ-9 Score - 12 - 0 - 0 -  Difficult doing work/chores - Somewhat difficult - Not difficult at all - - -    Review of Systems  Neurological: Positive for dizziness and headaches.  Psychiatric/Behavioral: The patient is nervous/anxious.  All other systems reviewed and are negative.      Objective:   Physical Exam  Awake, alert, appropriate, NAD Sitting on table Tight  rhomboids L>R However scalenes also tight L>R and levators- upper traps and splenius not tight.  Balance still impaired- wasn't able to stand up with feet together and eyes close-d posterior propulsion immediately.  No nystagmus- when rotated head fast back and forth.    trigger HA with pushing on trigger points.   Assessment & Plan:    Pt is a 65 yr old R handed male with hx of migraines- rate- usually gone after vomited- takes Zomig prn for HA's.  Also has elevated liver function- so on Actos- HTN and HLD-  Here for  headache control.  Here for f/u on post concussive syndrome.  Injury 04/15/20.  1. Exercise ball- start with sitting 30-60 minutes/day-  Then start to lift 1 foot off the ground and lean to one side or another.   2. Use theracane on shoulder blades, just to center of them- and scalenes- on side of neck and crease of neck/shoulder which is levators. Side of neck muscles- push in AND back a little.   3. Con't Nurtec for HA's- since not having frequently anymore, would not try to start prophylaxis anymore. Has 3 more refills.   4. PT working on [employer] placement- per pt- and balance as main issue right now.  Con't working with him and do Home exercise program that PT and I gave him- do daily!  5. Lidocaine patches- 2 patches- 12 hours on; 12 hours off. Have found this relaxes muscle in neck and can really stop migraine in its tracks.   6 Try to not sleep on stomach- sleep on side and keep neck/shoulder at 90 degrees.   7. If still having migraines, will do trigger point injections.   8. Pt is still not able to work- I'm hoping will be able to release him in the next 2-3 months.   9. . F/U in 2 months- double  Appointment  I spent a total of 45 minutes on visit- as detailed above- esp educating on myofascial release- and d/w Worker's Comp.

## 2020-08-27 NOTE — Patient Instructions (Signed)
  Pt is a 65 yr old R handed male with hx of migraines- rate- usually gone after vomited- takes Zomig prn for HA's.  Also has elevated liver function- so on Actos- HTN and HLD-  Here for  headache control.  Here for f/u on post concussive syndrome.  Injury 04/15/20.  1. Exercise ball- start with sitting 30-60 minutes/day-  Then start to lift 1 foot off the ground and lean to one side or another.   2. Use theracane on shoulder blades, just to center of them- and scalenes- on side of neck and crease of neck/shoulder which is levators. Side of neck muscles- push in AND back a little.   3. Con't Nurtec for HA's- since not having frequently anymore, would not try to start prophylaxis anymore. Has 3 more refills.   4. PT working on [employer] placement- per pt- and balance as main issue right now.  Con't working with him and do Home exercise program that PT and I gave him- do daily!  5. Lidocaine patches- 2 patches- 12 hours on; 12 hours off. Have found this relaxes muscle in neck and can really stop migraine in its tracks.   6 Try to not sleep on stomach- sleep on side and keep neck/shoulder at 90 degrees.   7. If still having migraines, will do trigger point injections.   8. F/U in 2 months- double  appointment

## 2020-08-28 ENCOUNTER — Telehealth: Payer: Self-pay

## 2020-08-28 NOTE — Telephone Encounter (Signed)
Prior Authorization for Lidocaine submitted

## 2020-08-28 NOTE — Telephone Encounter (Signed)
Approved 07/29/2020-08/28/2023

## 2020-09-02 ENCOUNTER — Other Ambulatory Visit: Payer: Self-pay

## 2020-09-02 ENCOUNTER — Ambulatory Visit: Payer: Worker's Compensation | Attending: Physical Medicine and Rehabilitation

## 2020-09-02 DIAGNOSIS — R42 Dizziness and giddiness: Secondary | ICD-10-CM | POA: Diagnosis present

## 2020-09-02 NOTE — Therapy (Signed)
[facility] [address] [place], [place], [address] Phone: [phone]   Fax:  [phone]  Physical Therapy Treatment  Patient Details  Name: Ruben West MRN: [id number] Date of Birth: [birth year] Referring Provider (PT): Courtney Heys, MD   Encounter Date: 09/02/2020   PT End of Session - 09/02/20 1401    Visit Number 6    Number of Visits 12    Date for PT Re-Evaluation 10/30/20    Authorization Type Worker's Comp has approved 12 visits    PT Start Time 1401    PT Stop Time 1444    PT Time Calculation (min) 43 min    Activity Tolerance Patient tolerated treatment well    Behavior During Therapy [facility] for tasks assessed/performed           Past Medical History:  Diagnosis Date  . ALLERGIC RHINITIS 11/23/2006  . Back pain   . 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 FATTY LIVER DISEASE 05/09/2008  . GERD 04/04/2007   no per pt  . Headache    Migraines a few times a year 1993  . Heart murmur   . Hypercholesteremia   . HYPERGLYCEMIA 04/04/2007  . HYPERTENSION 11/23/2006  . Migraines   . NASH (nonalcoholic steatohepatitis)   . 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 PUD (peptic ulcer disease)     Past Surgical History:  Procedure Laterality Date  . COLONOSCOPY    . ELECTROCARDIOGRAM  02/21/2006  . KNEE SURGERY Left    2015  . VASECTOMY      There were no vitals filed for this visit.   Subjective Assessment - 09/02/20 1403    Subjective Patient reports slight HA this morning. Still denies but dizziness, but feel the balance is still challenged.    Pertinent History Concussion, back pain, fatty liver disease, migraines, heart murmur, hyperglycemia, HTN, peptic ulcer disease, L knee surgery    Limitations Walking    Diagnostic tests CT scan    Patient Stated Goals To get back to work    Currently in Pain? No/denies             [facility] Adult PT Treatment/Exercise - 09/02/20 0001       Therapeutic Activites    Therapeutic Activities Work Simulation    Work [employer] Completed work simulation: completed standing without support, completed bending and to turn to look under table (simulating truck) to search for playing card placed in various locations. PT calling out # of card to search for completed x 8 reps. No dizziness or HA reported with completion. Balance good supervision throughout. Completed simulation of stepping into truck completed steps up to 6" step, then 10" step x 15 reps. No issues noted.           Vestibular Treatment/Exercise - 09/02/20 0001      Vestibular Treatment/Exercise   Vestibular Treatment Provided Habituation    Habituation Exercises Seated Vertical Head Turns;Seated Horizontal Head Turns      Seated Horizontal Head Turns   Number of Reps  10    Symptom Description  seated on blue physioball: completed bouncing with eyes focused on target in front 2 reps x 30 seconds, no dizziness but mild HA.  Added in head turn to R/L with pause in midline during bouncing x 10 reps.  Still no dizziness, but continue to have residual mild HA.      Seated Vertical Head Turns   Number of Reps  5  Symptom Description  seated on blue physioball: completed bouncing with eyes focused on target then added in vertical head turns x 5 reps, unable to complete more due to dizziness/HA increase.              Balance Exercises - 09/02/20 0001      Balance Exercises: Standing   Standing Eyes Closed Wide (BOA);Head turns;Foam/compliant surface;3 reps;30 secs;Limitations    Standing Eyes Closed Limitations feet hip width, completed eyes closed 3 x 30 seconds. then wide BOS horizontal/vertical head turns 2 x 10 reps. mild sensation of dizziness (reported at 1/10) after horizontal head turns.               PT Short Term Goals - 08/21/20 1545      PT SHORT TERM GOAL #1   Title Pt will participate in further assessment of vertigo with use of Frenzel lenses to  block fixation and will tolerate indicated treatment    Time 6    Period Weeks    Status Achieved    Target Date 09/15/20      PT SHORT TERM GOAL #2   Title Pt will participate in further assessment of motion sensitivity, VOR and sensory integration with MSQ, MCTSIB and DVA    Time 6    Period Weeks    Status Achieved    Target Date 09/15/20      PT SHORT TERM GOAL #3   Title Pt will initiate vestibular HEP    Time 6    Period Weeks    Status Achieved    Target Date 09/15/20             PT Long Term Goals - 08/21/20 1545      PT LONG TERM GOAL #1   Title Pt will demonstrate independence with final HEP  (LTG due 10/30/20)    Time 12    Period Weeks    Status New      PT LONG TERM GOAL #2   Title Pt will report no dizziness with supine <> sit and rolling to R side    Time 12    Period Weeks    Status Achieved      PT LONG TERM GOAL #3   Title Pt will demonstrate improved gaze stabilization as indicated by 2-3 line difference on DVA    Baseline (8, 4) 4 line difference    Time 12    Period Weeks    Status Revised      PT LONG TERM GOAL #4   Title Pt will demonstrate improved sensory integration as indicated by ability to perform all 4 conditions on MCTSIB 20-30 seconds    Baseline 2-3 seconds on condition 4    Time 12    Period Weeks    Status New                 Plan - 09/02/20 1452    Clinical Impression Statement Continued work simulation of stepping up/down from tall steps to simulate getting in/out of truck, as well as bending down and looking 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 Patient tolerating work simulation well with no dizziness. Initiated habituation activities seated on physioball with increase dizziness/HA reported, especially with vertical > horizontal.    Personal Factors and Comorbidities Comorbidity 3+    Comorbidities concussion, back pain, fatty liver disease, migraines, heart murmur, hyperglycemia, HTN, peptic  ulcer disease, L knee surgery    Examination-Activity Limitations Bed Mobility;Transfers  Examination-Participation Restrictions Driving;Occupation    Stability/Clinical Decision Making Stable/Uncomplicated    Rehab Potential Good    PT Frequency 1x / week    PT Duration 12 weeks    PT Treatment/Interventions ADLs/Self Care Home Management;Canalith Repostioning;Functional mobility training;Therapeutic activities;Therapeutic exercise;Balance training;Neuromuscular re-education;Patient/family education;Vestibular;Visual/perceptual remediation/compensation    PT Next Visit Plan continue to focus on more work related activities: bouncing in sitting (physioball) with head turns, step ups/down on TALL steps, 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  Work towards progressing gaze stabilization to walking forwards/backwards. standing balance eyes closed    Consulted and Agree with Plan of Care Patient           Patient will benefit from skilled therapeutic intervention in order to improve the following deficits and impairments:  Dizziness,Impaired vision/preception  Visit Diagnosis: Dizziness and giddiness     Problem List Patient Active Problem List   Diagnosis Date Noted  . 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  . Myofascial pain 05/09/2020  . Vitamin D deficiency 12/04/2019  . Work related injury 02/06/2019  . Prolapsed lumbar disc 02/06/2019  . Lumbar pain 01/31/2019  . Erectile dysfunction 06/12/2018  . DDD (degenerative disc disease), cervical 08/16/2017  . Lumbar radiculopathy 08/16/2017  . Myalgia 06/29/2017  . Right thyroid nodule 06/13/2017  . C6 radiculopathy 06/08/2017  . Cervical spine pain 06/07/2017  . Degenerative arthritis of knee, bilateral 01/01/2016  . Benign paroxysmal positional vertigo 12/10/2015  . Wellness examination 06/10/2015  . Screening for prostate cancer 06/10/2015  . Allergic rhinitis  06/10/2015  . Skin nodule 06/10/2015  . Screening examination for infectious disease 03/04/2014  . Obesity (BMI 30-39.9) 01/13/2014  . Numbness 01/12/2014  . Migraine with aura 01/12/2014  . Atypical chest pain 01/12/2014  . Hyperlipidemia 01/12/2014  . Migraine, unspecified, without mention of intractable migraine without mention of status migrainosus 06/30/2012  . Depression 05/22/2012  . Chronic meniscal tear of knee 12/22/2011  . FATTY LIVER DISEASE 05/09/2008  . 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 GERD 04/04/2007  . 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 HYPERGLYCEMIA 04/04/2007  . Essential hypertension 11/23/2006    Jones Bales, PT, DPT 09/02/2020, 2:55 PM  [facility] [address] [place], [place], [address] Phone: [phone]   Fax:  [phone]  Name: LENARDO WESTWOOD MRN: [id number] Date of Birth: 03-21-1956

## 2020-09-03 ENCOUNTER — Ambulatory Visit: Payer: BC Managed Care – PPO | Admitting: Physical Medicine and Rehabilitation

## 2020-09-09 ENCOUNTER — Ambulatory Visit: Payer: Worker's Compensation | Admitting: Physical Therapy

## 2020-09-11 ENCOUNTER — Encounter: Payer: BC Managed Care – PPO | Admitting: Family Medicine

## 2020-09-16 ENCOUNTER — Other Ambulatory Visit: Payer: Self-pay

## 2020-09-16 ENCOUNTER — Ambulatory Visit: Payer: Worker's Compensation

## 2020-09-16 DIAGNOSIS — R42 Dizziness and giddiness: Secondary | ICD-10-CM | POA: Diagnosis not present

## 2020-09-16 NOTE — Patient Instructions (Signed)
Gaze Stabilization: Standing Feet Apart (Patterned Background)    Feet shoulder width apart, keeping eyes on target on wall __3__ feet away, tilt head down 15-30 and move head side to side for __60_ seconds. Repeat while moving head up and down for _60_ seconds. Complete on a patterned background. Do __2__ sessions per day.

## 2020-09-16 NOTE — Progress Notes (Signed)
error 

## 2020-09-16 NOTE — Therapy (Signed)
[facility] [address] [place], [place], [address] Phone: [phone]   Fax:  [phone]  Physical Therapy Treatment  Patient Details  Name: Ruben West MRN: [id number] Date of Birth: 1955/11/12 Referring Provider (PT): Courtney Heys, MD   Encounter Date: 09/16/2020   PT End of Session - 09/16/20 1402     Visit Number 7    Number of Visits 12    Date for PT Re-Evaluation 10/30/20    Authorization Type Worker's Comp has approved 12 visits    PT Start Time 3557    PT Stop Time 1443    PT Time Calculation (min) 41 min    Activity Tolerance Patient tolerated treatment well    Behavior During Therapy [facility] for tasks assessed/performed             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   Subjective Assessment - 09/16/20 1404     Subjective Patient reports that sinus was very bad last week, therefore missed the appt. Overall reports feeling much better this week. No current dizziness or HA. Feels like the balance is getting better.    Pertinent History Concussion, back pain, fatty liver disease, migraines, heart murmur, hyperglycemia, HTN, peptic ulcer disease, L knee surgery    Limitations Walking    Diagnostic tests CT scan    Patient Stated Goals To get back to work    Currently in Pain? No/denies                [facility] Adult PT Treatment/Exercise - 09/16/20 0001       Ambulation/Gait   Ambulation/Gait Yes    Ambulation/Gait Assistance 5: Supervision    Ambulation/Gait Assistance Details throughout therapy session with activities    Assistive device None    Gait Pattern Within Functional Limits    Ambulation Surface Level;Indoor             Vestibular Treatment/Exercise - 09/16/20 0001       Vestibular Treatment/Exercise   Vestibular Treatment Provided Gaze;Habituation    Habituation Exercises Seated Vertical Head Turns;Seated Horizontal Head Turns    Gaze Exercises X1 Viewing Horizontal;X1 Viewing Vertical      Seated Horizontal Head Turns   Number of Reps  10    Symptom Description  seated on blue physioball: completed bouncing with eyes focused on target in front  x 30 seconds. Added in head turn to R/L without bouncing x 10 reps. No dizziness reported. Progressed to bouncing with eyes closed x 30 seconds, then added in head turns R/L x 10 reps with EC. No dizziness.      Seated Vertical Head Turns   Number of Reps  10    Symptom Description  seated on  blue physioball: completed bouncing with eyes focused on target in front  x 30 seconds. Added in head nods up/down without pause x 10 reps. No dizziness reported. Progressed to bouncing with eyes closed x 30 seconds, then added in head turns up/down x 10 reps with EC. No dizziness.      X1 Viewing Horizontal   Foot Position standing feet apart (plain background > patterned background)    Reps 3    Comments x 60 seconds; no symptoms with plain, mild symptoms with patterned background      X1 Viewing Vertical   Foot Position standing feet apart (plain background > patterned background)    Reps 3    Comments x 60 seconds; no symptoms with plain or patterned                Balance Exercises - 09/16/20 0001       Balance Exercises: Standing   Standing Eyes Opened Narrow base of support (BOS);Foam/compliant  surface;Head turns   horizontal/vertical head turns x 10 reps   Standing Eyes Closed Narrow base of support (BOS);Wide (BOA);Head turns;Foam/compliant surface;3 reps;30 secs;Limitations    Standing Eyes Closed Limitations 3 x 30 seconds with narrow BOS on airex EC, then with wide BOS completed horizontal/vertical head turns 2 x 10 reps each direction. no dizziness, just mild increased balance challenge wtih vertical > horizontal head turns.               PT Education - 09/16/20 1418     Education Details VOR progression    Person(s) Educated Patient    Methods Explanation;Demonstration;Handout    Comprehension Verbalized understanding;Returned demonstration              PT Short Term Goals - 08/21/20 1545       PT SHORT TERM GOAL #1   Title Pt will participate in further assessment of vertigo with use of Frenzel lenses to block fixation and will tolerate indicated treatment    Time 6    Period Weeks    Status Achieved    Target Date 09/15/20      PT SHORT TERM GOAL #2   Title Pt will participate in further assessment of motion sensitivity, VOR and sensory integration with MSQ, MCTSIB and DVA    Time 6    Period Weeks    Status Achieved    Target Date 09/15/20      PT SHORT TERM GOAL #3   Title Pt will initiate vestibular HEP    Time 6    Period Weeks    Status Achieved    Target Date 09/15/20               PT Long Term Goals - 08/21/20 1545       PT LONG TERM GOAL #1   Title Pt will demonstrate independence with final HEP  (LTG due 10/30/20)    Time 12    Period Weeks    Status New      PT LONG TERM GOAL #2   Title Pt will report no dizziness with supine <> sit and rolling to R side    Time 12    Period Weeks    Status Achieved      PT LONG TERM GOAL #3   Title Pt will demonstrate improved gaze stabilization as indicated by 2-3 line difference on DVA    Baseline (8, 4) 4 line difference    Time 12    Period Weeks  Status Revised      PT  LONG TERM GOAL #4   Title Pt will demonstrate improved sensory integration as indicated by ability to perform all 4 conditions on MCTSIB 20-30 seconds    Baseline 2-3 seconds on condition 4    Time 12    Period Weeks    Status New                   Plan - 09/16/20 1441     Clinical Impression Statement Continued focus on habituation to horizontal/head turns today seated on physioball, significant improvement today with no dizziness or HA. Continued balance and VOR progression, with patient tolerating gaze progression to patterned background and 60 seconds well. Patient reports feeling approx 99% better, and is interested in returning to limited work activiites soon. PT educating to reach out to MD regarding return to work clearance.    Personal Factors and Comorbidities Comorbidity 3+    Comorbidities concussion, back pain, fatty liver disease, migraines, heart murmur, hyperglycemia, HTN, peptic ulcer disease, L knee surgery    Examination-Activity Limitations Bed Mobility;Transfers    Examination-Participation Restrictions Driving;Occupation    Stability/Clinical Decision Making Stable/Uncomplicated    Rehab Potential Good    PT Frequency 1x / week    PT Duration 12 weeks    PT Treatment/Interventions ADLs/Self Care Home Management;Canalith Repostioning;Functional mobility training;Therapeutic activities;Therapeutic exercise;Balance training;Neuromuscular re-education;Patient/family education;Vestibular;Visual/perceptual remediation/compensation    PT Next Visit Plan continue to focus on more work related activities: bouncing in sitting (physioball) with head turns, step ups/down on TALL steps, 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  Work towards progressing gaze stabilization to walking forwards/backwards. standing balance eyes closed. I believe paitent will be ready to return to work soon.    Consulted and Agree with Plan  of Care Patient             Patient will benefit from skilled therapeutic intervention in order to improve the following deficits and impairments:  Dizziness, Impaired vision/preception  Visit Diagnosis: Dizziness and giddiness     Problem List Patient Active Problem List   Diagnosis Date Noted   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   Myofascial pain 05/09/2020   Vitamin D deficiency 12/04/2019   Work related injury 02/06/2019   Prolapsed lumbar disc 02/06/2019   Lumbar pain 01/31/2019   Erectile dysfunction 06/12/2018   DDD (degenerative disc disease), cervical 08/16/2017   Lumbar radiculopathy 08/16/2017   Myalgia 06/29/2017   Right thyroid nodule 06/13/2017   C6 radiculopathy 06/08/2017   Cervical spine pain 06/07/2017   Degenerative arthritis of knee, bilateral 01/01/2016   Benign paroxysmal positional vertigo 12/10/2015   Wellness examination 06/10/2015   Screening for prostate cancer 06/10/2015   Allergic rhinitis 06/10/2015   Skin nodule 06/10/2015   Screening examination for infectious disease 03/04/2014   Obesity (BMI 30-39.9) 01/13/2014   Numbness 01/12/2014   Migraine with aura 01/12/2014   Atypical chest pain 01/12/2014   Hyperlipidemia 01/12/2014   Migraine, unspecified, without mention of intractable migraine without mention of status migrainosus 06/30/2012   Depression 05/22/2012   Chronic meniscal tear of knee 12/22/2011   FATTY LIVER DISEASE 05/09/2008   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   GERD 04/04/2007   ERECTILE DYSFUNCTION, 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 [place], [place], [address]  Phone: [phone]   Fax:  [phone]  Name: Ruben West MRN: [id number] Date of Birth: 1955-08-28

## 2020-09-23 ENCOUNTER — Ambulatory Visit: Payer: Worker's Compensation | Attending: Internal Medicine | Admitting: Physical Therapy

## 2020-09-23 ENCOUNTER — Other Ambulatory Visit: Payer: Self-pay

## 2020-09-23 DIAGNOSIS — R42 Dizziness and giddiness: Secondary | ICD-10-CM

## 2020-09-23 NOTE — Therapy (Signed)
[facility] [address] [place], [place], [address] Phone: [phone]   Fax:  [phone]  Physical Therapy Treatment and D/C Summary  Patient Details  Name: Ruben West MRN: [id number] Date of Birth: [birth year] Referring Provider (Ruben West): Courtney Heys, MD   Encounter Date: 09/23/2020   Ruben West End of Session - 09/23/20 1348     Visit Number 8    Number of Visits 12    Date for Ruben West Re-Evaluation 10/30/20    Authorization Type Worker's Comp has approved 12 visits    Ruben West Start Time 0383    Ruben West Stop Time 3383    Ruben West Time Calculation (min) 33 min    Activity Tolerance Patient tolerated treatment well    Behavior During Therapy [facility] for tasks assessed/performed             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   Subjective Assessment - 09/23/20 1320     Subjective Ruben West feeling really good, hoping to be released from therapy and return to work soon.  Has been driving his car without issues.  Only HA with sinus irritation now.    Pertinent History Concussion, back pain, fatty liver disease, migraines, heart murmur, hyperglycemia, HTN, peptic ulcer disease, L knee surgery    Limitations Walking    Diagnostic tests CT scan    Patient Stated Goals To get back to work    Currently in Pain? No/denies                 [facility] Ruben West Assessment - 09/23/20 1322       Assessment   Medical Diagnosis Vertigo    Referring Provider (Ruben West) Courtney Heys, MD    Onset Date/Surgical Date 07/28/20    Prior Therapy yes - for neck      Precautions   Precautions Other (comment)    Precaution Comments back pain, fatty liver disease, migraines, heart murmur, hyperglycemia, HTN, peptic ulcer disease, L knee surgery      Observation/Other Assessments   Focus on Therapeutic Outcomes (FOTO)  Not indicated for worker's comp                 Vestibular Assessment - 09/23/20 1323       Visual Acuity   Static 8    Dynamic 4.5   1/2 of line 5 without glasses, no dizziness     Balancemaster   Balancemaster Comment MCTSIB: 30 seconds for all conditions      Positional Sensitivities   Positional Sensitivities Comments goal met on 5/26                       Vestibular Treatment/Exercise - 09/23/20 1338  Vestibular Treatment/Exercise   Vestibular Treatment Provided Gaze    Gaze Exercises X2 Viewing Horizontal;X2 Viewing Vertical      X2 Viewing Horizontal   Foot Position seated and then standing feet apart    Reps 2     Comments 30 seconds, no symptoms      X2 Viewing Vertical   Foot Position standing feet apart    Reps 1    Comments 30 seconds, no symptoms                   Ruben West Education - 09/23/20 1347     Education Details Goals met, D/C from Ruben West today, indications to return to therapy.    Person(s) Educated Patient    Methods Explanation    Comprehension Verbalized understanding              Ruben West Short Term Goals - 08/21/20 1545       Ruben West SHORT TERM GOAL #1   Title Ruben West will participate in further assessment of vertigo with use of Frenzel lenses to block fixation and will tolerate indicated treatment    Time 6    Period Weeks    Status Achieved    Target Date 09/15/20      Ruben West SHORT TERM GOAL #2   Title Ruben West will participate in further assessment of motion  sensitivity, VOR and sensory integration with MSQ, MCTSIB and DVA    Time 6    Period Weeks    Status Achieved    Target Date 09/15/20      Ruben West SHORT TERM GOAL #3   Title Ruben West will initiate vestibular HEP    Time 6    Period Weeks    Status Achieved    Target Date 09/15/20               Ruben West Long Term Goals - 09/23/20 1331       Ruben West LONG TERM GOAL #1   Title Ruben West will demonstrate independence with final HEP  (LTG due 10/30/20)    Time 12    Period Weeks    Status Achieved      Ruben West LONG TERM GOAL #2   Title Ruben West will report no dizziness with supine <> sit and rolling to R side    Time 12    Period Weeks    Status Achieved      Ruben West LONG TERM GOAL #3   Title Ruben West will demonstrate improved gaze stabilization as indicated by 2-3 line difference on DVA    Baseline (8, 4) 4 line difference    Time 12    Period Weeks    Status Partially Met      Ruben West LONG TERM GOAL #4   Title Ruben West will demonstrate improved sensory integration as indicated by ability to perform all 4 conditions on MCTSIB 20-30 seconds    Baseline 2-3 seconds on condition 4    Time 12    Period Weeks    Status Achieved                   Plan - 09/23/20 1349     Clinical Impression Statement Ruben West has made excellent progress and reports full resolution of all symptoms including neck pain, vertigo and motion sensitivity.  Ruben West's migraine HA have decreased in frequency and are only triggered by sinus irritation now.  Ruben West demonstrates significant improvement in sensory integration as indicated by ability to perform condition 4 on MCTSIB with minimal sway.  Ruben West also demonstrates half a line improvement in DVA indicating improved VOR gain.  Ruben West has met 3/4 LTG and partially met 4th goal.  Ruben West is safe for D/C from therapy at this time.    Personal Factors and Comorbidities Comorbidity 3+    Comorbidities concussion, back pain, fatty liver disease, migraines, heart murmur, hyperglycemia, HTN, peptic ulcer disease, L knee surgery     Examination-Activity Limitations Bed Mobility;Transfers    Examination-Participation Restrictions Driving;Occupation    Stability/Clinical Decision Making Stable/Uncomplicated    Rehab Potential Good    Ruben West Frequency 1x / week    Ruben West Duration 12 weeks    Ruben West Treatment/Interventions ADLs/Self Care Home Management;Canalith Repostioning;Functional mobility training;Therapeutic activities;Therapeutic exercise;Balance training;Neuromuscular re-education;Patient/family education;Vestibular;Visual/perceptual remediation/compensation    Ruben West Next Visit Plan D/C    Consulted and Agree with Plan of Care Patient             Patient will benefit from skilled therapeutic intervention in order to improve the following deficits and impairments:  Dizziness, Impaired vision/preception  Visit Diagnosis: Dizziness and giddiness     Problem List Patient Active Problem List   Diagnosis Date Noted   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   Myofascial pain 05/09/2020   Vitamin D deficiency 12/04/2019   Work related injury 02/06/2019   Prolapsed lumbar disc 02/06/2019   Lumbar pain 01/31/2019   Erectile dysfunction 06/12/2018   DDD (degenerative disc disease), cervical 08/16/2017   Lumbar radiculopathy 08/16/2017   Myalgia 06/29/2017   Right thyroid nodule 06/13/2017   C6 radiculopathy 06/08/2017   Cervical spine pain 06/07/2017   Degenerative arthritis of knee, bilateral 01/01/2016   Benign paroxysmal positional vertigo 12/10/2015   Wellness examination 06/10/2015   Screening for prostate cancer 06/10/2015   Allergic rhinitis 06/10/2015   Skin nodule 06/10/2015   Screening examination for infectious disease 03/04/2014   Obesity (BMI 30-39.9) 01/13/2014   Numbness 01/12/2014   Migraine with aura 01/12/2014   Atypical chest pain 01/12/2014   Hyperlipidemia 01/12/2014   Migraine, unspecified, without mention of intractable migraine without mention of status migrainosus 06/30/2012   Depression  05/22/2012   Chronic meniscal tear of knee 12/22/2011   FATTY LIVER DISEASE 05/09/2008   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   GERD 04/04/2007   ERECTILE DYSFUNCTION, 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   Remaining deficits: None   Education / Equipment: HEP   Patient agrees to discharge. Patient goals were met. Patient is being discharged due to meeting the stated rehab goals.  Ruben West, Ruben West, Ruben West 09/23/20    1:53 PM    [facility] [address] [place], [place], [address] Phone: [phone]   Fax:  [phone]  Name: Ruben West MRN: [id number] Date of Birth: 1956-01-26

## 2020-09-23 NOTE — Patient Instructions (Signed)
Gaze Stabilization: Standing Feet Apart (Patterned Background)    Feet shoulder width apart, keeping eyes on target on wall __3__ feet away, tilt head down 15-30 and move head side to side for __60_ seconds. Repeat while moving head up and down for _60_ seconds. Complete on a patterned background. Do __2__ sessions per day.

## 2020-09-30 ENCOUNTER — Encounter: Payer: BC Managed Care – PPO | Admitting: Physical Therapy

## 2020-10-07 ENCOUNTER — Encounter: Payer: BC Managed Care – PPO | Admitting: Physical Therapy

## 2020-10-21 ENCOUNTER — Encounter: Payer: BC Managed Care – PPO | Admitting: Physical Therapy

## 2020-10-27 ENCOUNTER — Encounter
Payer: Worker's Compensation | Attending: Physical Medicine and Rehabilitation | Admitting: Physical Medicine and Rehabilitation

## 2020-10-27 ENCOUNTER — Other Ambulatory Visit: Payer: Self-pay

## 2020-10-27 ENCOUNTER — Encounter: Payer: Self-pay | Admitting: Physical Medicine and Rehabilitation

## 2020-10-27 VITALS — BP 134/77 | HR 75 | Temp 98.0°F | Ht 68.5 in | Wt 263.4 lb

## 2020-10-27 DIAGNOSIS — F0781 Postconcussional syndrome: Secondary | ICD-10-CM | POA: Diagnosis present

## 2020-10-27 NOTE — Patient Instructions (Signed)
Pt is a 65 yr old R handed male with hx of migraines- rate- usually gone after vomited- takes Zomig prn for HA's. Also has elevated liver function- so on Actos- HTN and HLD- Here for  headache control.  Here for f/u on post concussive syndrome.  Injury 04/15/20.  No f/u needed- but will do prn.  2. Doing great- has recovered.  3. Balance is great 4. Can stay at full time- come back if needed.  5. Pt that have 2 brain injuries in 1 year, have increased risk of death.  Just be more careful for the next 6 months, then risk is same as others.

## 2020-10-27 NOTE — Progress Notes (Signed)
Subjective:    Patient ID: Ruben West, male    DOB: [birth year], 65 y.o.   MRN: [id number]  HPI   Pt is a 65 yr old R handed male with hx of migraines- rate- usually gone after vomited- takes Zomig prn for HA's. Also has elevated liver function- so on Actos- HTN and HLD- Here for  headache control.  Here for f/u on post concussive syndrome.  Injury 04/15/20.  Pain 0/10-  No HA's since 09/02/20 Grandson just graduated and thinks it was stress.  No dizziness  Sleeping well.  Started back to work 09/30/20.  And tolerating well.   Working 60 hours/week- gone for 10 days and then back to 3 days.  But working well.      Pain Inventory Average Pain 0 Pain Right Now 0 My pain is  no pain  LOCATION OF PAIN  no pain  BOWEL Number of stools per week: 7-10 Oral laxative use No  Type of laxative none Enema or suppository use No  History of colostomy No  Incontinent No   BLADDER Normal In and out cath, frequency n/a Able to self cath No  Bladder incontinence No  Frequent urination No  Leakage with coughing No  Difficulty starting stream No  Incomplete bladder emptying No    Mobility walk without assistance how many minutes can you walk? 60 min or more ability to climb steps?  yes do you drive?  yes Do you have any goals in this area?  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  yes  Neuro/Psych No problems in this area  Prior Studies Any changes since last visit?  no  Physicians involved in your care Any changes since last visit?  no   Family History  Problem Relation Age of Onset   Heart attack Mother        MI, deceased 56   Seizures Mother    Emphysema Father        Deceased, 26   Healthy Daughter        x2   Seizures Grandchild    Cancer Neg Hx        no cancer in immediate family   Colon cancer Neg Hx    Esophageal cancer Neg Hx    Rectal cancer Neg Hx    Stomach cancer  Neg Hx    Colon polyps Neg Hx    Social History   Socioeconomic History   Marital status: Married    Spouse name: Millie   Number of children: 2   Years of education: 11 grade   Highest education level: Not on file  Occupational History   Occupation: Therapist, sports: [employer]  Tobacco Use   Smoking status: Light Smoker    Types: Cigars   Smokeless tobacco: Never   Tobacco comments:    smokes maybe 5 cigars a year  Vaping Use   Vaping Use: Never used  Substance and Sexual Activity   Alcohol use: Yes    Alcohol/week: 0.0 standard drinks    Comment: Rarely   Drug use: No   Sexual activity: Yes  Other Topics Concern   Not on file  Social History Narrative   Lives with wife in a one story home.  Has 2 grandchildren living with him.     Works as a Administrator.     Education: 10th grade.  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   PUD (peptic ulcer disease)    BP (!) 153/90   Pulse 75   Temp 98 F (36.7 C)   Ht 5' 8.5" (1.74 m)   Wt 263 lb 6.4 oz (119.5 kg)   SpO2 96%   BMI 39.47 kg/m   Opioid Risk Score:   Fall Risk Score:  `1  Depression screen PHQ 2/9  Depression screen [facility] 2/9  10/27/2020 06/25/2020 05/09/2020 12/04/2019 12/04/2019 10/30/2018 10/30/2018  Decreased Interest 0 2 2 0 0 0 0  Down, Depressed, Hopeless 0 '2 2 1 '$ 0 1 0  PHQ - 2 Score 0 '4 4 1 '$ 0 1 0  Altered sleeping 0 - 0 - 0 - 0  Tired, decreased energy 0 - 2 - 0 - 0  Change in appetite 0 - 2 - 0 - 0  Feeling bad or failure about yourself  0 - 0 - 0 - 0  Trouble concentrating 0 - 2 - 0 - 0  Moving slowly or fidgety/restless 0 - 2 - 0 - 0  Suicidal thoughts 0 - 0 - 0 - 0  PHQ-9 Score 0 - 12 - 0 - 0  Difficult doing work/chores - - Somewhat difficult - Not difficult at all - -    Review of Systems  All other systems reviewed and are negative.     Objective:   Physical Exam  Awake, alert, appropriate, sitting on table, NAD Balance is great with feet together and eyes closed- very trace sway but is normal.   Working on weight per pt.       Assessment & Plan:      Pt is a 65 yr old R handed male with hx of migraines- rate- usually gone after vomited- takes Zomig prn for HA's. Also has elevated liver function- so on Actos- HTN and HLD- Here for  headache control.  Here for f/u on post concussive syndrome.  Injury 04/15/20.  No f/u needed- but will do prn.  2. Doing great- has recovered.  3. Balance is great 4. Can stay at full time- come back if needed.  5. Pt that have 2 brain injuries in 1 year, have increased risk of death.  Just be more careful for the next 6 months, then risk is same as others.

## 2020-11-10 ENCOUNTER — Encounter: Payer: Self-pay | Admitting: Neurology

## 2020-11-10 ENCOUNTER — Ambulatory Visit: Payer: BC Managed Care – PPO | Admitting: Neurology

## 2020-11-10 ENCOUNTER — Other Ambulatory Visit: Payer: Self-pay

## 2020-11-10 VITALS — BP 150/93 | HR 76 | Ht 68.5 in | Wt 262.0 lb

## 2020-11-10 DIAGNOSIS — G43109 Migraine with aura, not intractable, without status migrainosus: Secondary | ICD-10-CM

## 2020-11-10 MED ORDER — ONDANSETRON 4 MG PO TBDP: 4.0000 mg | ORAL_TABLET | Freq: Three times a day (TID) | ORAL | 1 refills | Status: DC | PRN

## 2020-11-10 NOTE — Progress Notes (Signed)
Follow-up Visit   Date: 11/10/20   Ruben West MRN: [id number] DOB: [birth year]   Interim History: Ruben West is a 65 y.o. right-handed Caucasian male with hyperlipidemia and hypertension returning to the clinic for follow-up of migraines.  The patient was accompanied to the clinic by self.  He has been doing very well.  His post-concussion headaches have resolved and he is no longer Cymbalta.  He completed PT.  He returned to work in July.  His last migraine was in June, but feels it may have been more of a stress headache. It responded well to Nurtec. He was diagnosed with OSA and developed headache after wearing the mask.  He is requesting refill for zofran helps with his severe nausea.    Medications:  Current Outpatient Medications on File Prior to Visit  Medication Sig Dispense Refill   aspirin EC 81 MG tablet Take 81 mg by mouth every morning.     Cholecalciferol (VITAMIN D-3) 25 MCG (1000 UT) CAPS Take 1 capsule by mouth daily.     lidocaine (LIDODERM) 5 % Place 2 patches onto the skin daily. Put on neck as directed at night for 12 hours or so- Remove & Discard patch within 12 hours or as directed by MD 30 patch 5   losartan-hydrochlorothiazide (HYZAAR) 50-12.5 MG tablet TAKE 1 TABLET BY MOUTH EVERY DAY 90 tablet 1   lovastatin (MEVACOR) 40 MG tablet TAKE 1 TABLET BY MOUTH EVERYDAY AT BEDTIME 90 tablet 1   metaxalone (SKELAXIN) 800 MG tablet Take 1 tablet (800 mg total) by mouth 3 (three) times daily as needed for muscle spasms. 60 tablet 5   ondansetron (ZOFRAN ODT) 4 MG disintegrating tablet Take 1 tablet (4 mg total) by mouth every 8 (eight) hours as needed for nausea or vomiting. 20 tablet 0   pioglitazone (ACTOS) 15 MG tablet TAKE 1 TABLET BY MOUTH EVERY DAY 90 tablet 3   prazosin (MINIPRESS) 2 MG capsule Take 2 mg by mouth at bedtime.     Rimegepant Sulfate (NURTEC) 75 MG TBDP Take 75 mg by mouth as needed (migraine- the first 15 minutes of migraine, it works  best). 8 tablet 5   sildenafil (VIAGRA) 100 MG tablet Take 0.5-1 tablets (50-100 mg total) by mouth daily as needed for erectile dysfunction. 5 tablet 11   sucralfate (CARAFATE) 1 g tablet Take 1 g by mouth 4 (four) times daily.     zolmitriptan (ZOMIG-ZMT) 5 MG disintegrating tablet DISSOLVE 1 TABLET ON TONGUE AS NEEDED FOR MIGRAINE 10 tablet 5   No current facility-administered medications on file prior to visit.    Allergies:  Allergies  Allergen Reactions   Gabapentin Nausea And Vomiting    Vital Signs:  BP (!) 150/93   Pulse 76   Ht 5' 8.5" (1.74 m)   Wt 262 lb (118.8 kg)   SpO2 96%   BMI 39.26 kg/m   Neurological Exam: MENTAL STATUS including orientation to time, place, person, recent and remote memory, attention span and concentration, language, and fund of knowledge is normal.  Speech is not dysarthric.  CRANIAL NERVES:  No visual field defects.  Pupils equal round and reactive to light.  Normal conjugate, extra-ocular eye movements in all directions of gaze.  No ptosis.    MOTOR:  Motor strength is 5/5 in all extremities.  No atrophy, fasciculations or abnormal movements.  No pronator drift.  Tone is normal.    COORDINATION/GAIT:  Gait narrow based and stable. Tandem  gait intact.   Data:  CT head and cervical spine 04/15/2020:  No acute intracranial or cervical spine abnormalities.    IMPRESSION/PLAN: Episodic migraine without aura, well-controlled occurring every 3 months - He gets relief with Nurtec '75mg'$ , for severe migraine, he takes zomig  - Refill for zofran '4mg'$  given as needed for severe migraine  Post-concussion headaches, resolved  Return to clinic as needed  Thank you for allowing me to participate in patient's care.  If I can answer any additional questions, I would be pleased to do so.    Sincerely,    Yamel Bale K. Posey Pronto, DO

## 2020-11-18 ENCOUNTER — Ambulatory Visit (AMBULATORY_SURGERY_CENTER): Payer: Self-pay | Admitting: *Deleted

## 2020-11-18 ENCOUNTER — Other Ambulatory Visit: Payer: Self-pay

## 2020-11-18 VITALS — Ht 68.5 in | Wt 259.0 lb

## 2020-11-18 DIAGNOSIS — Z86010 Personal history of colonic polyps: Secondary | ICD-10-CM

## 2020-11-18 MED ORDER — PEG-KCL-NACL-NASULF-NA ASC-C 100 G PO SOLR: 1.0000 | Freq: Once | ORAL | 0 refills | Status: AC

## 2020-11-18 NOTE — Progress Notes (Signed)
Patient is here in-person for PV. Patient denies any allergies to eggs or soy. Patient denies any problems with anesthesia/sedation. Patient denies any oxygen use at home. Patient denies taking any diet/weight loss medications or blood thinners. Patient is aware of our care-partner policy and 0000000 safety protocol.   EMMI education assigned to the patient for the procedure, sent to [place].   Patient is COVID-19 vaccinated.  movi Prep Prescription coupon was given to the patient.

## 2020-11-19 ENCOUNTER — Telehealth: Payer: Self-pay | Admitting: Gastroenterology

## 2020-11-19 DIAGNOSIS — Z86010 Personal history of colonic polyps: Secondary | ICD-10-CM

## 2020-11-19 MED ORDER — PEG 3350-KCL-NA BICARB-NACL 420 G PO SOLR: 4000.0000 mL | Freq: Once | ORAL | 0 refills | Status: AC

## 2020-11-19 NOTE — Telephone Encounter (Signed)
Inbound call from patient stating Moviprep is not covered by his insurance and is wanting it to be changed to PEG since that is covered under insurance please.

## 2020-11-19 NOTE — Telephone Encounter (Signed)
New Golytely rx sent to pharmacy and new prep instructions sent to Mychart and mailed to patient.

## 2020-11-20 ENCOUNTER — Other Ambulatory Visit: Payer: Self-pay | Admitting: Internal Medicine

## 2020-11-20 NOTE — Telephone Encounter (Signed)
Please refill as per office routine med refill policy (all routine meds refilled for 3 mo or monthly per pt preference up to one year from last visit, then month to month grace period for 3 mo, then further med refills will have to be denied)  

## 2020-12-01 ENCOUNTER — Encounter: Payer: Self-pay | Admitting: Certified Registered Nurse Anesthetist

## 2020-12-02 ENCOUNTER — Encounter: Payer: Self-pay | Admitting: Gastroenterology

## 2020-12-02 ENCOUNTER — Other Ambulatory Visit: Payer: Self-pay

## 2020-12-02 ENCOUNTER — Ambulatory Visit (AMBULATORY_SURGERY_CENTER): Payer: BC Managed Care – PPO | Admitting: Gastroenterology

## 2020-12-02 VITALS — BP 126/73 | HR 58 | Temp 97.5°F | Resp 15 | Ht 68.5 in | Wt 259.0 lb

## 2020-12-02 DIAGNOSIS — D123 Benign neoplasm of transverse colon: Secondary | ICD-10-CM

## 2020-12-02 DIAGNOSIS — Z86010 Personal history of colonic polyps: Secondary | ICD-10-CM | POA: Diagnosis not present

## 2020-12-02 DIAGNOSIS — D122 Benign neoplasm of ascending colon: Secondary | ICD-10-CM

## 2020-12-02 DIAGNOSIS — D125 Benign neoplasm of sigmoid colon: Secondary | ICD-10-CM

## 2020-12-02 MED ORDER — SODIUM CHLORIDE 0.9 % IV SOLN: 500.0000 mL | Freq: Once | INTRAVENOUS | Status: DC

## 2020-12-02 NOTE — Op Note (Signed)
[facility] Patient Name: Ruben West Procedure Date: 12/02/2020 10:11 AM MRN: [id number] Endoscopist: Remo Lipps P. Havery Moros , MD Age: 65 Referring MD:  Date of Birth: [birth year] Gender: Male Account #: [id number] Procedure:                Colonoscopy Indications:              High risk colon cancer surveillance: Personal                            history of colonic polyps - 08/2017 - 4 adenomas /                            sessile serrated polyps removed at that time Medicines:                Monitored Anesthesia Care Procedure:                Pre-Anesthesia Assessment:                           - Prior to the procedure, a History and Physical                            was performed, and patient medications and                            allergies were reviewed. The patient's tolerance of                            previous anesthesia was also reviewed. The risks                            and benefits of the procedure and the sedation                            options and risks were discussed with the patient.                            All questions were answered, and informed consent                            was obtained. Prior Anticoagulants: The patient has                            taken no previous anticoagulant or antiplatelet                            agents. ASA Grade Assessment: II - A patient with                            mild systemic disease. After reviewing the risks                            and benefits, the patient was deemed in  satisfactory condition to undergo the procedure.                           After obtaining informed consent, the colonoscope                            was passed under direct vision. Throughout the                            procedure, the patient's blood pressure, pulse, and                            oxygen saturations were monitored continuously. The                            CF [id number]  [id number] was introduced through the anus                            and advanced to the the cecum, identified by                            appendiceal orifice and ileocecal valve. The                            colonoscopy was performed without difficulty. The                            patient tolerated the procedure well. The quality                            of the bowel preparation was good. The ileocecal                            valve, appendiceal orifice, and rectum were                            photographed. Scope In: 10:30:46 AM Scope Out: 10:46:48 AM Scope Withdrawal Time: 0 hours 13 minutes 55 seconds  Total Procedure Duration: 0 hours 16 minutes 2 seconds  Findings:                 The perianal and digital rectal examinations were                            normal.                           Three sessile polyps were found in the ascending                            colon. The polyps were 2 to 4 mm in size. These                            polyps were removed with a cold snare. Resection  and retrieval were complete.                           A 2 mm polyp was found in the transverse colon. The                            polyp was sessile. The polyp was removed with a                            cold snare. Resection and retrieval were complete.                           A 3 mm polyp was found in the sigmoid colon. The                            polyp was sessile. The polyp was removed with a                            cold snare. Resection and retrieval were complete.                           Internal hemorrhoids were found during retroflexion.                           The exam was otherwise without abnormality. Complications:            No immediate complications. Estimated blood loss:                            Minimal. Estimated Blood Loss:     Estimated blood loss was minimal. Impression:               - Three 2 to 4 mm polyps in the  ascending colon,                            removed with a cold snare. Resected and retrieved.                           - One 2 mm polyp in the transverse colon, removed                            with a cold snare. Resected and retrieved.                           - One 3 mm polyp in the sigmoid colon, removed with                            a cold snare. Resected and retrieved.                           - Internal hemorrhoids.                           - The examination  was otherwise normal. Recommendation:           - Patient has a contact number available for                            emergencies. The signs and symptoms of potential                            delayed complications were discussed with the                            patient. Return to normal activities tomorrow.                            Written discharge instructions were provided to the                            patient.                           - Resume previous diet.                           - Continue present medications.                           - Await pathology results. Remo Lipps P. Havery Moros, MD 12/02/2020 10:51:24 AM This report has been signed electronically.

## 2020-12-02 NOTE — Progress Notes (Signed)
Report given to PACU, vss 

## 2020-12-02 NOTE — Patient Instructions (Signed)
Handouts on polyps and hemorrhoids given to you today    YOU HAD AN ENDOSCOPIC PROCEDURE TODAY AT [facility]:   Refer to the procedure report that was given to you for any specific questions about what was found during the examination.  If the procedure report does not answer your questions, please call your gastroenterologist to clarify.  If you requested that your care partner not be given the details of your procedure findings, then the procedure report has been included in a sealed envelope for you to review at your convenience later.  YOU SHOULD EXPECT: Some feelings of bloating in the abdomen. Passage of more gas than usual.  Walking can help get rid of the air that was put into your GI tract during the procedure and reduce the bloating. If you had a lower endoscopy (such as a colonoscopy or flexible sigmoidoscopy) you may notice spotting of blood in your stool or on the toilet paper. If you underwent a bowel prep for your procedure, you may not have a normal bowel movement for a few days.  Please Note:  You might notice some irritation and congestion in your nose or some drainage.  This is from the oxygen used during your procedure.  There is no need for concern and it should clear up in a day or so.  SYMPTOMS TO REPORT IMMEDIATELY:  Following lower endoscopy (colonoscopy or flexible sigmoidoscopy):  Excessive amounts of blood in the stool  Significant tenderness or worsening of abdominal pains  Swelling of the abdomen that is new, acute  Fever of 100F or higher  For urgent or emergent issues, a gastroenterologist can be reached at any hour by calling [phone]. Do not use MyChart messaging for urgent concerns.    DIET:  We do recommend a small meal at first, but then you may proceed to your regular diet.  Drink plenty of fluids but you should avoid alcoholic beverages for 24 hours.  ACTIVITY:  You should plan to take it easy for the rest of today and you should  NOT DRIVE or use heavy machinery until tomorrow (because of the sedation medicines used during the test).    FOLLOW UP: Our staff will call the number listed on your records 48-72 hours following your procedure to check on you and address any questions or concerns that you may have regarding the information given to you following your procedure. If we do not reach you, we will leave a message.  We will attempt to reach you two times.  During this call, we will ask if you have developed any symptoms of COVID 19. If you develop any symptoms (ie: fever, flu-like symptoms, shortness of breath, cough etc.) before then, please call [phone].  If you test positive for Covid 19 in the 2 weeks post procedure, please call and report this information to [name].    If any biopsies were taken you will be contacted by phone or by letter within the next 1-3 weeks.  Please call us at [phone] if you have not heard about the biopsies in 3 weeks.    SIGNATURES/CONFIDENTIALITY: You and/or your care partner have signed paperwork which will be entered into your electronic medical record.  These signatures attest to the fact that that the information above on your After Visit Summary has been reviewed and is understood.  Full responsibility of the confidentiality of this discharge information lies with you and/or your care-partner.

## 2020-12-02 NOTE — Progress Notes (Signed)
Pt's states no medical or surgical changes since previsit or office visit. 

## 2020-12-02 NOTE — Progress Notes (Signed)
Called to room to assist during endoscopic procedure.  Patient ID and intended procedure confirmed with present staff. Received instructions for my participation in the procedure from the performing physician.  

## 2020-12-02 NOTE — Progress Notes (Signed)
[facility] History and Physical   Primary Care Physician:  Biagio Borg, MD   Reason for Procedure:   History of colon polyps (4 adenomas 08/2017)  Plan:    colonoscopy     HPI: Ruben West is a 65 y.o. male  here for colonoscopy surveillance, history of polyps. Patient denies any bowel symptoms at this time. No family history of colon cancer known. Otherwise feels well without any cardiopulmonary symptoms.    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 Authorizing Provider  aspirin EC 81 MG tablet Take 81 mg by mouth every morning.    [provider]  Cholecalciferol (VITAMIN D-3) 25 MCG (1000 UT) CAPS Take 1 capsule by mouth daily.    [provider]  lidocaine (LIDODERM) 5 % Place 2 patches onto the skin daily. Put on neck as directed at night for 12 hours or so- Remove & Discard patch within 12 hours or as directed by MD 08/27/20   Courtney Heys, MD  losartan-hydrochlorothiazide (HYZAAR) 50-12.5 MG tablet TAKE 1 TABLET BY MOUTH EVERY DAY 07/24/20   Biagio Borg, MD  lovastatin (MEVACOR) 40 MG tablet TAKE 1 TABLET BY MOUTH EVERYDAY AT BEDTIME 11/21/20   Biagio Borg, MD  MAGNESIUM PO Take by mouth.    [provider]  ondansetron (ZOFRAN ODT) 4 MG disintegrating  tablet Take 1 tablet (4 mg total) by mouth every 8 (eight) hours as needed for nausea or vomiting. 11/10/20   Narda Amber K, DO  pioglitazone (ACTOS) 15 MG tablet TAKE 1 TABLET BY MOUTH EVERY DAY 12/04/19   Renato Shin, MD  POTASSIUM PO Take by mouth.    [provider]  Rimegepant Sulfate (NURTEC) 75 MG TBDP Take 75 mg by mouth as needed (migraine- the first 15 minutes of migraine, it works best). 06/25/20   Lovorn, Jinny Blossom, MD  sildenafil (VIAGRA) 100 MG tablet Take 0.5-1 tablets (50-100 mg total) by mouth daily as needed for erectile dysfunction. 12/04/19   Biagio Borg, MD  VITAMIN D PO Take by mouth.    [provider]  zolmitriptan (ZOMIG-ZMT) 5 MG disintegrating tablet DISSOLVE 1 TABLET ON TONGUE AS NEEDED FOR MIGRAINE 05/09/20   Lovorn, Jinny Blossom, MD    Current Outpatient Medications  Medication Sig Dispense Refill   aspirin EC 81 MG tablet Take 81 mg by mouth every morning.     Cholecalciferol (VITAMIN D-3) 25 MCG (1000 UT) CAPS Take 1 capsule by mouth daily.     lidocaine (LIDODERM) 5 % Place 2 patches onto the skin daily. Put on neck as directed at night for 12 hours or so- Remove & Discard patch within 12  hours or as directed by MD 30 patch 5   losartan-hydrochlorothiazide (HYZAAR) 50-12.5 MG tablet TAKE 1 TABLET BY MOUTH EVERY DAY 90 tablet 1   lovastatin (MEVACOR) 40 MG tablet TAKE 1 TABLET BY MOUTH EVERYDAY AT BEDTIME 90 tablet 1   MAGNESIUM PO Take by mouth.     ondansetron (ZOFRAN ODT) 4 MG disintegrating tablet Take 1 tablet (4 mg total) by mouth every 8 (eight) hours as needed for nausea or vomiting. 20 tablet 1   pioglitazone (ACTOS) 15 MG tablet TAKE 1 TABLET BY MOUTH EVERY DAY 90 tablet 3   POTASSIUM PO Take by mouth.     Rimegepant Sulfate (NURTEC) 75 MG TBDP Take 75 mg by mouth as needed (migraine- the first 15 minutes of migraine, it works best). 8 tablet 5   sildenafil (VIAGRA) 100 MG tablet Take 0.5-1 tablets (50-100 mg total) by mouth daily as needed for  erectile dysfunction. 5 tablet 11   VITAMIN D PO Take by mouth.     zolmitriptan (ZOMIG-ZMT) 5 MG disintegrating tablet DISSOLVE 1 TABLET ON TONGUE AS NEEDED FOR MIGRAINE 10 tablet 5   Current Facility-Administered Medications  Medication Dose Route Frequency Provider Last Rate Last Admin   0.9 %  sodium chloride infusion  500 mL Intravenous Once Kashius Dominic, Carlota Raspberry, MD        Allergies as of 12/02/2020 - Review Complete 12/02/2020  Allergen Reaction Noted   Gabapentin Nausea And Vomiting 11/03/2017    Family History  Problem Relation Age of Onset   Heart attack Mother        MI, deceased 13   Seizures Mother    Emphysema Father        Deceased, 20   Healthy Daughter        x2   Seizures Grandchild    Cancer Neg Hx        no cancer in immediate family   Colon cancer Neg Hx    Esophageal cancer Neg Hx    Rectal cancer Neg Hx    Stomach cancer Neg Hx    Colon polyps Neg Hx     Social History   Socioeconomic History   Marital status: Married    Spouse name: Millie   Number of children: 2   Years of education: 11 grade   Highest education level: Not on file  Occupational History   Occupation: Therapist, sports: [employer]  Tobacco Use   Smoking status: Former    Types: Cigars   Smokeless tobacco: Never   Tobacco comments:    smokes maybe 5 cigars a year  Vaping Use   Vaping Use: Never used  Substance and Sexual Activity   Alcohol use: Not Currently    Comment: Rarely   Drug use: No   Sexual activity: Yes  Other Topics Concern   Not on file  Social History Narrative   Lives with wife in a one story home.  Has 2 grandchildren living with him.     Works as a Administrator.     Education: 10th grade.        Right hand    Social Determinants of Health   Financial Resource Strain: Not on file  Food Insecurity: Not on file  Transportation Needs: Not on file  Physical Activity: Not on file  Stress: Not on file  Social Connections: Not on file   Intimate Partner Violence: Not on file    Review of Systems: All other review of  systems negative except as mentioned in the HPI.  Physical Exam: Vital signs BP 132/72   Pulse 67   Temp (!) 97.5 F (36.4 C) (Temporal)   Ht 5' 8.5" (1.74 m)   Wt 259 lb (117.5 kg)   SpO2 95%   BMI 38.81 kg/m   General:   Alert,  Well-developed, well-nourished, pleasant and cooperative in NAD Lungs:  Clear throughout to auscultation.   Heart:  Regular rate and rhythm Abdomen:  Soft, nontender and nondistended.   Neuro/Psych:  Alert and cooperative. Normal mood and affect. A and O x 3  Jolly Mango, MD [facility]

## 2020-12-08 ENCOUNTER — Other Ambulatory Visit: Payer: Self-pay

## 2020-12-08 ENCOUNTER — Encounter: Payer: Self-pay | Admitting: Internal Medicine

## 2020-12-08 ENCOUNTER — Ambulatory Visit (INDEPENDENT_AMBULATORY_CARE_PROVIDER_SITE_OTHER): Payer: BC Managed Care – PPO | Admitting: Internal Medicine

## 2020-12-08 VITALS — BP 132/70 | HR 55 | Temp 97.9°F | Ht 68.5 in | Wt 260.0 lb

## 2020-12-08 DIAGNOSIS — E7800 Pure hypercholesterolemia, unspecified: Secondary | ICD-10-CM | POA: Diagnosis not present

## 2020-12-08 DIAGNOSIS — L989 Disorder of the skin and subcutaneous tissue, unspecified: Secondary | ICD-10-CM

## 2020-12-08 DIAGNOSIS — E538 Deficiency of other specified B group vitamins: Secondary | ICD-10-CM | POA: Diagnosis not present

## 2020-12-08 DIAGNOSIS — E559 Vitamin D deficiency, unspecified: Secondary | ICD-10-CM

## 2020-12-08 DIAGNOSIS — Z23 Encounter for immunization: Secondary | ICD-10-CM

## 2020-12-08 DIAGNOSIS — I10 Essential (primary) hypertension: Secondary | ICD-10-CM

## 2020-12-08 DIAGNOSIS — R7309 Other abnormal glucose: Secondary | ICD-10-CM | POA: Diagnosis not present

## 2020-12-08 DIAGNOSIS — Z0001 Encounter for general adult medical examination with abnormal findings: Secondary | ICD-10-CM | POA: Diagnosis not present

## 2020-12-08 LAB — URINALYSIS, ROUTINE W REFLEX MICROSCOPIC
Bilirubin Urine: NEGATIVE
Hgb urine dipstick: NEGATIVE
Ketones, ur: NEGATIVE
Leukocytes,Ua: NEGATIVE
Nitrite: NEGATIVE
RBC / HPF: NONE SEEN (ref 0–?)
Specific Gravity, Urine: 1.025 (ref 1.000–1.030)
Total Protein, Urine: NEGATIVE
Urine Glucose: NEGATIVE
Urobilinogen, UA: 0.2 (ref 0.0–1.0)
pH: 5.5 (ref 5.0–8.0)

## 2020-12-08 LAB — BASIC METABOLIC PANEL
BUN: 20 mg/dL (ref 6–23)
CO2: 27 mEq/L (ref 19–32)
Calcium: 9.4 mg/dL (ref 8.4–10.5)
Chloride: 105 mEq/L (ref 96–112)
Creatinine, Ser: 1.01 mg/dL (ref 0.40–1.50)
GFR: 78.18 mL/min (ref >60.00)
Glucose, Bld: 86 mg/dL (ref 70–99)
Potassium: 4.1 mEq/L (ref 3.5–5.1)
Sodium: 141 mEq/L (ref 135–145)

## 2020-12-08 LAB — VITAMIN D 25 HYDROXY (VIT D DEFICIENCY, FRACTURES): VITD: 30.31 ng/mL (ref 30.00–100.00)

## 2020-12-08 LAB — CBC WITH DIFFERENTIAL/PLATELET
Basophils Absolute: 0.1 10*3/uL (ref 0.0–0.1)
Basophils Relative: 1.2 % (ref 0.0–3.0)
Eosinophils Absolute: 0.2 10*3/uL (ref 0.0–0.7)
Eosinophils Relative: 2.8 % (ref 0.0–5.0)
HCT: 45.7 % (ref 39.0–52.0)
Hemoglobin: 15.5 g/dL (ref 13.0–17.0)
Lymphocytes Relative: 40.3 % (ref 12.0–46.0)
Lymphs Abs: 3 10*3/uL (ref 0.7–4.0)
MCHC: 33.9 g/dL (ref 30.0–36.0)
MCV: 88.2 fl (ref 78.0–100.0)
Monocytes Absolute: 0.6 10*3/uL (ref 0.1–1.0)
Monocytes Relative: 8.3 % (ref 3.0–12.0)
Neutro Abs: 3.5 10*3/uL (ref 1.4–7.7)
Neutrophils Relative %: 47.4 % (ref 43.0–77.0)
Platelets: 212 10*3/uL (ref 150.0–400.0)
RBC: 5.18 Mil/uL (ref 4.22–5.81)
RDW: 13.5 % (ref 11.5–15.5)
WBC: 7.3 10*3/uL (ref 4.0–10.5)

## 2020-12-08 LAB — HEPATIC FUNCTION PANEL
ALT: 33 U/L (ref 0–53)
AST: 22 U/L (ref 0–37)
Albumin: 3.9 g/dL (ref 3.5–5.2)
Alkaline Phosphatase: 62 U/L (ref 39–117)
Bilirubin, Direct: 0.1 mg/dL (ref 0.0–0.3)
Total Bilirubin: 0.6 mg/dL (ref 0.2–1.2)
Total Protein: 6.4 g/dL (ref 6.0–8.3)

## 2020-12-08 LAB — LIPID PANEL
Cholesterol: 114 mg/dL (ref 0–200)
HDL: 39 mg/dL — ABNORMAL LOW (ref >39.00)
NonHDL: 75.46
Total CHOL/HDL Ratio: 3
Triglycerides: 230 mg/dL — ABNORMAL HIGH (ref 0.0–149.0)
VLDL: 46 mg/dL — ABNORMAL HIGH (ref 0.0–40.0)

## 2020-12-08 LAB — LDL CHOLESTEROL, DIRECT: Direct LDL: 58 mg/dL

## 2020-12-08 LAB — HEMOGLOBIN A1C: Hgb A1c MFr Bld: 5.3 % (ref 4.6–6.5)

## 2020-12-08 LAB — VITAMIN B12: Vitamin B-12: 831 pg/mL (ref 211–911)

## 2020-12-08 LAB — PSA: PSA: 0.52 ng/mL (ref 0.10–4.00)

## 2020-12-08 LAB — TSH: TSH: 4.75 u[IU]/mL (ref 0.35–5.50)

## 2020-12-08 MED ORDER — PIOGLITAZONE HCL 15 MG PO TABS: 15.0000 mg | ORAL_TABLET | Freq: Every day | ORAL | 3 refills | Status: DC

## 2020-12-08 MED ORDER — LOSARTAN POTASSIUM-HCTZ 50-12.5 MG PO TABS: 1.0000 | ORAL_TABLET | Freq: Every day | ORAL | 3 refills | Status: DC

## 2020-12-08 MED ORDER — LOVASTATIN 40 MG PO TABS: ORAL_TABLET | ORAL | 3 refills | Status: DC

## 2020-12-08 NOTE — Progress Notes (Signed)
Patient ID: Ruben West, male   DOB: [birth year], 65 y.o.   MRN: [id number]         Chief Complaint:: wellness exam and low vit d, hyperglycemia, hld, former smoker, new skin lesion right temple       HPI:  Ruben West is a 65 y.o. male here for wellness exam; due for flu shot; has not had covid boosters and does not want, declines shigrix, o/w up to date with preventive referrals and immunizations.                        Also Gained wt with recent concussion, now trying to lose again, trying get off the cpap as well but o/w with good compliance.  Not taking Vit D.  Pt denies chest pain, increased sob or doe, wheezing, orthopnea, PND, increased LE swelling, palpitations, dizziness or syncope.   Pt denies polydipsia, polyuria, or new focal neuro s/s.  Has been able to quit smoking completely and does not plan to restart .  Also has a new skin lesion to the right temple area riased, somewhat tan but no black, just not healing.  Trying to follow low chol diet, ok for cardiac Ct score.  No other new complaints Wt Readings from Last 3 Encounters:  12/08/20 260 lb (117.9 kg)  12/02/20 259 lb (117.5 kg)  11/18/20 259 lb (117.5 kg)   BP Readings from Last 3 Encounters:  12/08/20 132/70  12/02/20 126/73  11/10/20 (!) 150/93   Immunization History  Administered Date(s) Administered   Fluad Quad(high Dose 65) 12/08/2020   Influenza Split 01/29/2011   Influenza, Seasonal, Injecte, Preservative Fre 01/10/2015   Influenza,inj,Quad PF,6+ Mos 02/13/2016   Influenza,inj,quad, With Preservative 01/27/2017   Influenza-Unspecified 01/10/2015, 01/27/2017   PFIZER(Purple Top)SARS-COV-2 Vaccination 11/04/2019, 11/25/2019   Pneumococcal Polysaccharide-23 12/08/2020   Td 11/28/2002   Tdap 03/04/2014   Zoster, Live 03/04/2014   There are no preventive care reminders to display for this patient.     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 His smoking use included cigars. He has never used smokeless tobacco. He reports that he does not currently use alcohol. He reports that he does not use drugs. family history includes Emphysema in his father; Healthy in his daughter; Heart attack in his mother; Seizures in his grandchild and mother. Allergies  Allergen Reactions   Gabapentin Nausea And Vomiting   Current Outpatient Medications on File Prior to Visit  Medication Sig Dispense Refill   aspirin EC 81 MG tablet Take 81 mg by mouth every morning.     Cholecalciferol (VITAMIN D-3) 25 MCG (1000 UT) CAPS Take 1 capsule by mouth daily.  lidocaine (LIDODERM) 5 % Place 2 patches onto the skin daily. Put on neck as directed at night for 12 hours or so- Remove & Discard patch within 12 hours or as directed by MD 30 patch 5   MAGNESIUM PO Take by mouth.     ondansetron (ZOFRAN ODT) 4 MG disintegrating tablet Take 1 tablet (4 mg total) by mouth every 8 (eight) hours as needed for nausea or vomiting. 20 tablet 1   POTASSIUM PO Take by mouth.     Rimegepant Sulfate (NURTEC) 75 MG TBDP Take 75 mg by mouth as needed (migraine- the first 15 minutes of migraine, it works best). 8 tablet 5   sildenafil (VIAGRA) 100 MG tablet Take 0.5-1 tablets (50-100 mg total) by mouth daily as needed for erectile dysfunction. 5 tablet 11   VITAMIN D PO Take by mouth.      zolmitriptan (ZOMIG-ZMT) 5 MG disintegrating tablet DISSOLVE 1 TABLET ON TONGUE AS NEEDED FOR MIGRAINE 10 tablet 5   No current facility-administered medications on file prior to visit.        ROS:  All others reviewed and negative.  Objective        PE:  BP 132/70 (BP Location: Right Arm, Patient Position: Sitting, Cuff Size: Large)   Pulse (!) 55   Temp 97.9 F (36.6 C) (Oral)   Ht 5' 8.5" (1.74 m)   Wt 260 lb (117.9 kg)   SpO2 97%   BMI 38.96 kg/m                 Constitutional: Pt appears in NAD               HENT: Head: NCAT.                Right Ear: External ear normal.                 Left Ear: External ear normal.                Eyes: . Pupils are equal, round, and reactive to light. Conjunctivae and EOM are normal               Nose: without d/c or deformity               Neck: Neck supple. Gross normal ROM               Cardiovascular: Normal rate and regular rhythm.                 Pulmonary/Chest: Effort normal and breath sounds without rales or wheezing.                Abd:  Soft, NT, ND, + BS, no organomegaly               Neurological: Pt is alert. At baseline orientation, motor grossly intact               Skin: LE edema - none, right temple with tan raised irregular lesion nonulcerated nontender approx 12 mm               Psychiatric: Pt behavior is normal without agitation   Micro: none  Cardiac tracings I have personally interpreted today:  none  Pertinent Radiological findings (summarize): none   Lab Results  Component Value Date   WBC 7.3 12/08/2020   HGB 15.5 12/08/2020   HCT 45.7 12/08/2020   PLT 212.0 12/08/2020   GLUCOSE 86 12/08/2020  CHOL 114 12/08/2020   TRIG 230.0 (H) 12/08/2020   HDL 39.00 (L) 12/08/2020   LDLDIRECT 58.0 12/08/2020   LDLCALC 56 12/04/2019   ALT 33 12/08/2020   AST 22 12/08/2020   NA 141 12/08/2020   K 4.1 12/08/2020   CL 105 12/08/2020   CREATININE 1.01 12/08/2020   BUN 20 12/08/2020   CO2 27 12/08/2020   TSH  4.75 12/08/2020   PSA 0.52 12/08/2020   HGBA1C 5.3 12/08/2020   Assessment/Plan:  Ruben West is a 65 y.o. White or Caucasian [1] male with  has a past medical history of ALLERGIC RHINITIS (11/23/2006), Back pain, ERECTILE DYSFUNCTION, ORGANIC (04/04/2007), FATTY LIVER DISEASE (05/09/2008), GERD (04/04/2007), Headache, Heart murmur, Hypercholesteremia, HYPERGLYCEMIA (04/04/2007), HYPERTENSION (11/23/2006), Migraines, NASH (nonalcoholic steatohepatitis), Other testicular hypofunction (05/19/2007), PUD (peptic ulcer disease), and Sleep apnea.  Vitamin D deficiency Last vitamin D Lab Results  Component Value Date   VD25OH 33 12/04/2019   Low to start oral replacement   Encounter for well adult exam with abnormal findings Age and sex appropriate education and counseling updated with regular exercise and diet Referrals for preventative services - none needed Immunizations addressed - declines covid booster, shingris Smoking counseling  - none needed Evidence for depression or other mood disorder - none significant Most recent labs reviewed. I have personally reviewed and have noted: 1) the patient's medical and social history 2) The patient's current medications and supplements 3) The patient's height, weight, and BMI have been recorded in the chart   Skin lesion of face Recent onset, for derm referral  Hyperlipidemia Lab Results  Component Value Date   LDLCALC 56 12/04/2019   Stable, pt to continue current statin lovastatin 40   HYPERGLYCEMIA Lab Results  Component Value Date   HGBA1C 5.3 12/08/2020   Stable, pt to continue current medical treatment actos 15   Essential hypertension BP Readings from Last 3 Encounters:  12/08/20 132/70  12/02/20 126/73  11/10/20 (!) 150/93   Stable, pt to continue medical treatment hyzaar  Followup: No follow-ups on file.  Cathlean Cower, MD 12/14/2020 12:26 PM [facility]

## 2020-12-08 NOTE — Patient Instructions (Addendum)
You had the flu shot today, and the Pneumovax pneumonia shot as well  Please consider the Novavax covid vaccine probably out in the pharmacies next month, instead of any further mrna covid boosters  We have discussed the Cardiac CT Score test to measure the calcification level (if any) in your heart arteries.  This test has been ordered in our [facility], so please call [facility] directly, as they prefer this, at [phone] to be scheduled.  Please do not return to smoking  Please continue all other medications as before, and refills have been done if requested.  Please have the pharmacy call with any other refills you may need.  Please continue your efforts at being more active, low cholesterol diet, and weight control.  You are otherwise up to date with prevention measures today.  Please keep your appointments with your specialists as you may have planned  Please go to the LAB at the blood drawing area for the tests to be done  You will be contacted by phone if any changes need to be made immediately.  Otherwise, you will receive a letter about your results with an explanation, but please check with MyChart first.  Please remember to sign up for MyChart if you have not done so, as this will be important to you in the future with finding out test results, communicating by private email, and scheduling acute appointments online when needed.  Please make an Appointment to return for your 1 year visit, or sooner if needed

## 2020-12-08 NOTE — Assessment & Plan Note (Signed)
Last vitamin D Lab Results  Component Value Date   VD25OH 33 12/04/2019   Low to start oral replacement

## 2020-12-14 ENCOUNTER — Encounter: Payer: Self-pay | Admitting: Internal Medicine

## 2020-12-14 NOTE — Assessment & Plan Note (Addendum)
Lab Results  Component Value Date   LDLCALC 56 12/04/2019   Stable, pt to continue current statin lovastatin 40, also for cardiac ct score

## 2020-12-14 NOTE — Assessment & Plan Note (Signed)
BP Readings from Last 3 Encounters:  12/08/20 132/70  12/02/20 126/73  11/10/20 (!) 150/93   Stable, pt to continue medical treatment hyzaar

## 2020-12-14 NOTE — Assessment & Plan Note (Signed)
Age and sex appropriate education and counseling updated with regular exercise and diet Referrals for preventative services - none needed Immunizations addressed - declines covid booster, shingris Smoking counseling  - none needed Evidence for depression or other mood disorder - none significant Most recent labs reviewed. I have personally reviewed and have noted: 1) the patient's medical and social history 2) The patient's current medications and supplements 3) The patient's height, weight, and BMI have been recorded in the chart

## 2020-12-14 NOTE — Assessment & Plan Note (Signed)
Lab Results  Component Value Date   HGBA1C 5.3 12/08/2020   Stable, pt to continue current medical treatment actos 15

## 2020-12-14 NOTE — Assessment & Plan Note (Signed)
Recent onset, for derm referral

## 2021-01-12 ENCOUNTER — Telehealth: Payer: Self-pay

## 2021-01-12 NOTE — Telephone Encounter (Signed)
PA for Nurtec has been submitted.

## 2021-01-16 NOTE — Telephone Encounter (Signed)
Approved 12/13/20-01/12/22

## 2021-06-12 ENCOUNTER — Other Ambulatory Visit: Payer: Self-pay | Admitting: Physical Medicine and Rehabilitation

## 2021-06-22 ENCOUNTER — Encounter: Payer: Self-pay | Admitting: Dermatology

## 2021-06-22 ENCOUNTER — Other Ambulatory Visit: Payer: Self-pay

## 2021-06-22 ENCOUNTER — Ambulatory Visit: Payer: BC Managed Care – PPO | Admitting: Dermatology

## 2021-06-22 DIAGNOSIS — L219 Seborrheic dermatitis, unspecified: Secondary | ICD-10-CM

## 2021-06-22 DIAGNOSIS — L918 Other hypertrophic disorders of the skin: Secondary | ICD-10-CM | POA: Diagnosis not present

## 2021-06-22 DIAGNOSIS — L821 Other seborrheic keratosis: Secondary | ICD-10-CM | POA: Diagnosis not present

## 2021-06-22 DIAGNOSIS — L820 Inflamed seborrheic keratosis: Secondary | ICD-10-CM

## 2021-06-22 DIAGNOSIS — Z1283 Encounter for screening for malignant neoplasm of skin: Secondary | ICD-10-CM

## 2021-06-22 DIAGNOSIS — D485 Neoplasm of uncertain behavior of skin: Secondary | ICD-10-CM

## 2021-06-22 MED ORDER — HYDROCORTISONE 2.5 % EX CREA: TOPICAL_CREAM | Freq: Two times a day (BID) | CUTANEOUS | 8 refills | Status: DC

## 2021-06-22 NOTE — Patient Instructions (Signed)

## 2021-06-23 ENCOUNTER — Encounter: Payer: Self-pay | Admitting: Dermatology

## 2021-06-24 NOTE — Telephone Encounter (Signed)
Patient aware isk no further work needed ?

## 2021-07-10 ENCOUNTER — Encounter: Payer: Self-pay | Admitting: Dermatology

## 2021-07-10 NOTE — Progress Notes (Signed)
? ?  New Patient ?  ?Subjective  ?Ruben West is a 66 y.o. male who presents for the following: Skin Problem (Lesion on right temple x 30 years. Pt also states he has itchy ears. ). ? ?Change in spot temple, general skin check. ?Location:  ?Duration:  ?Quality:  ?Associated Signs/Symptoms: ?Modifying Factors:  ?Severity:  ?Timing: ?Context:  ? ? ?The following portions of the chart were reviewed this encounter and updated as appropriate:  Tobacco  Allergies  Meds  Problems  Med Hx  Surg Hx  Fam Hx   ?  ? ?Objective  ?Well appearing patient in no apparent distress; mood and affect are within normal limits. ?Waist up exam: No atypical pigmented lesions (all checked with dermoscopy).  1 possible nonmelanoma skin cancer right temple will be biopsied. ? ?Left Axilla ?Pedunculated 3 mm flesh-colored papule ? ?Right Temporal Scalp ?Waxy pink 1 cm crust, rule out carcinoma ? ? ? ? ? ? ?Left Shoulder - Anterior ?5 mm brown flattopped textured papule ? ?Left Cymba, Right Cymba ?Erythema and scale face more than years. ? ? ? ?All skin waist up examined. ? ? ?Assessment & Plan  ?Encounter for screening for malignant neoplasm of skin ? ?Annual skin examination.  Encouraged to self examine with spouse twice annually.  Continue ultraviolet protection. ? ?Skin tag ?Left Axilla ? ?May choose to remove in future ? ?Neoplasm of uncertain behavior of skin ?Right Temporal Scalp ? ?Skin / nail biopsy ?Type of biopsy: tangential   ?Informed consent: discussed and consent obtained   ?Timeout: patient name, date of birth, surgical site, and procedure verified   ?Anesthesia: the lesion was anesthetized in a standard fashion   ?Anesthetic:  1% lidocaine w/ epinephrine 1-100,000 local infiltration ?Instrument used: flexible razor blade   ?Hemostasis achieved with: aluminum chloride and electrodesiccation   ?Outcome: patient tolerated procedure well   ?Post-procedure details: wound care instructions given   ? ?Specimen 1 - Surgical  pathology ?Differential Diagnosis: bcc vs scc- cautery only  ? ?Check Margins: No ? ?Seborrheic keratosis ?Left Shoulder - Anterior ? ?Leave if stable ? ?Seborrheic dermatitis ?Left Cymba; Right Cymba ? ?Generic Hytone 2.5% cream nightly for 2 to 4 weeks, taper if improved ? ?hydrocortisone 2.5 % cream - Left Cymba, Right Cymba ?Apply topically 2 (two) times daily. ? ? ?

## 2021-09-08 IMAGING — CT CT ANGIO NECK
1 of 10 series · 6 of 33 positions shown · IV contrast (omnipaque)
Comparison: None.

Head CT 05/18/2016, MRI/MRA head 01/13/2014 (images unavailable)

CLINICAL DATA: Headache, acute, severe, worse headache of life.

EXAM:
CT ANGIOGRAPHY HEAD AND NECK
TECHNIQUE: Multidetector CT imaging of the head and neck was performed using
the standard protocol during bolus administration of intravenous
contrast. Multiplanar CT image reconstructions and MIPs were
obtained to evaluate the vascular anatomy. Carotid stenosis
measurements (when applicable) are obtained utilizing NASCET
criteria, using the distal internal carotid diameter as the
denominator.
CONTRAST:  75mL OMNIPAQUE IOHEXOL 350 MG/ML SOLN

[Series 11: ax thin · axial · 0.39mm/px · z∈[-326,-66]mm · 6 of 366 slices shown]
[im 53/366  soft-tissue]
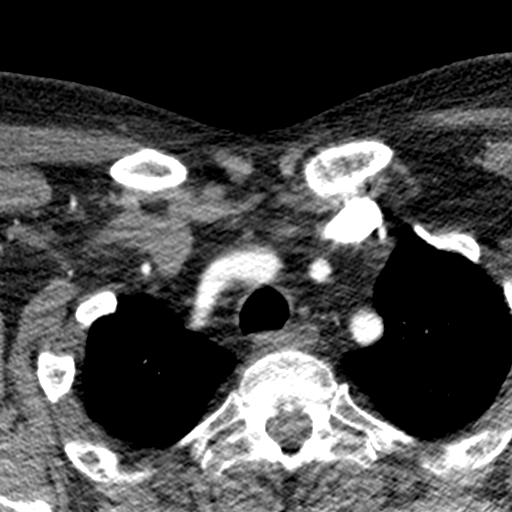
[im 105/366  bone]
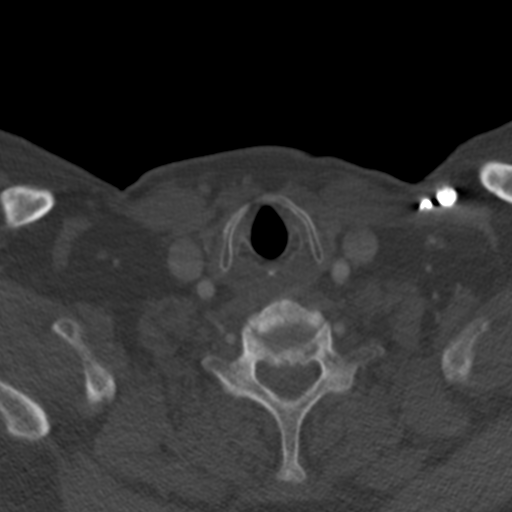
[im 157/366  soft-tissue]
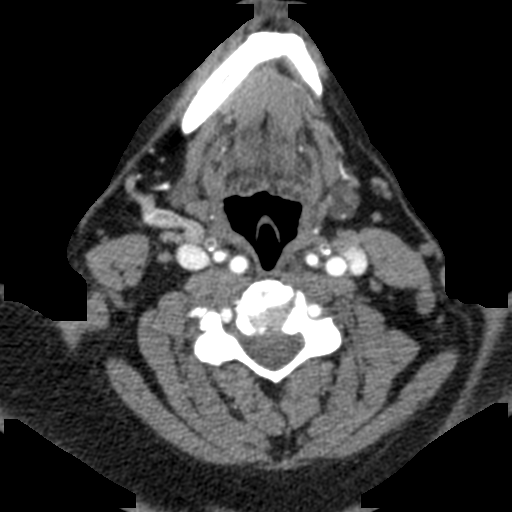
[im 209/366  bone]
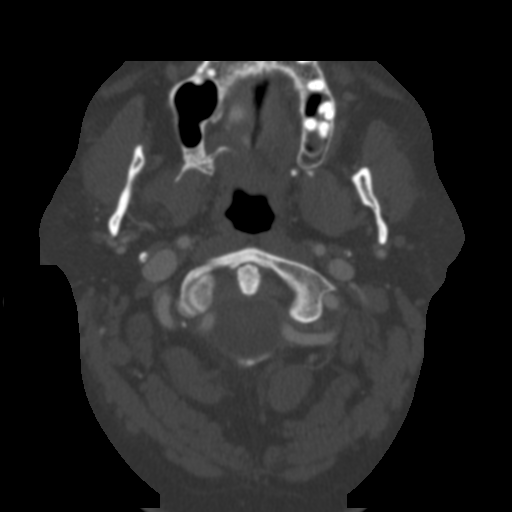
[im 261/366  soft-tissue]
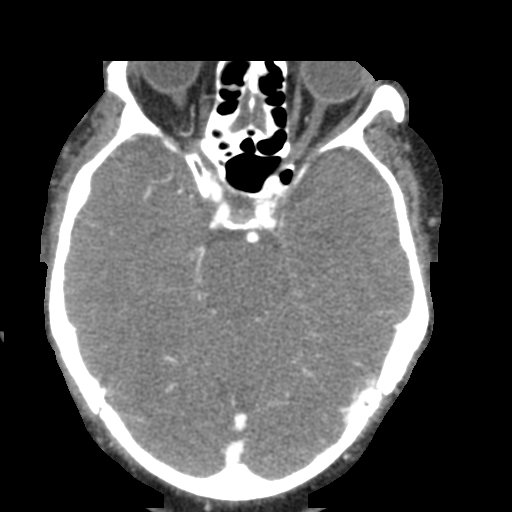
[im 313/366  bone]
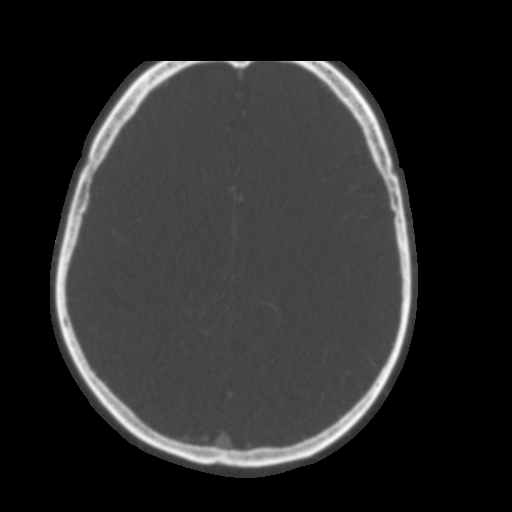

[6 of 33 positions shown; findings below may reference images not displayed]

FINDINGS: CT HEAD FINDINGS

Brain:

No evidence of acute intracranial hemorrhage.

No demarcated cortical infarction.

No evidence of intracranial mass.

No midline shift or extra-axial fluid collection.

Cerebral volume is normal for age.

Vascular: Reported separately

Skull: Normal. Negative for fracture or focal lesion.

Sinuses: Mild scattered paranasal sinus mucosal thickening, greatest
within the inferior left maxillary sinus.

Orbits: Visualized orbits demonstrate no acute abnormality.

CTA NECK FINDINGS

Aortic arch: Standard aortic branching.

Right carotid system: CCA and ICA smooth and widely patent within
the neck without stenosis. Partially retropharyngeal course of the
right ICA.

Left carotid system: CCA and ICA smooth and widely patent within the
neck without stenosis.

Vertebral arteries: Codominant. The vertebral arteries are widely
patent throughout the neck.

Skeleton: No acute bony abnormality or suspicious osseous lesion.
Mild cervical spondylosis.

Other neck: 1.8 cm right thyroid lobe nodule. No soft tissue neck
mass or pathologically enlarged cervical chain lymph nodes.

Upper chest: No consolidation within the imaged lung apices.

Review of the MIP images confirms the above findings

CTA HEAD FINDINGS

Anterior circulation:

The intracranial internal carotid arteries are patent without
significant stenosis. The right middle and anterior cerebral
arteries are patent without significant proximal stenosis. The left
middle and anterior cerebral arteries are patent without significant
proximal stenosis. No intracranial aneurysm is identified.

Posterior circulation:

Soft and calcified plaque within the intracranial vertebral
arteries. Sites of up to mild stenosis within the intracranial right
vertebral artery. Sites of up to mild/moderate stenosis within the
intracranial left vertebral artery. The basilar artery is widely
patent. The bilateral posterior cerebral arteries are patent without
significant proximal stenosis.

Venous sinuses: Within limitations of contrast timing, no convincing
thrombus.

Anatomic variants: Posterior communicating arteries are poorly
delineated bilaterally and may be hypoplastic or absent.

Review of the MIP images confirms the above findings
IMPRESSION: CT head:

Normal noncontrast CT appearance of the brain for age. No evidence
of acute intracranial abnormality.

CTA neck:

1. Common carotid, internal carotid and vertebral arteries patent
within the neck without stenosis
2. 1.8 cm right thyroid lobe nodule. This was previously imaged by
thyroid ultrasound on 06/22/2017 with biopsy recommended at that
time. Please correlate with any available pathology.

CTA head:

1. No intracranial large vessel occlusion.
2. Mixed plaque within the intracranial vertebral arteries with
sites of up to mild stenosis on the right and mild/moderate stenosis
on the left.
3. No intracranial aneurysm identified.

## 2021-11-19 ENCOUNTER — Other Ambulatory Visit: Payer: Self-pay | Admitting: Internal Medicine

## 2021-11-19 DIAGNOSIS — R7309 Other abnormal glucose: Secondary | ICD-10-CM

## 2021-11-19 NOTE — Telephone Encounter (Signed)
Please refill as per office routine med refill policy (all routine meds to be refilled for 3 mo or monthly (per pt preference) up to one year from last visit, then month to month grace period for 3 mo, then further med refills will have to be denied) ? ?

## 2021-11-20 ENCOUNTER — Other Ambulatory Visit: Payer: Self-pay

## 2021-11-20 ENCOUNTER — Emergency Department (HOSPITAL_COMMUNITY)
Admission: EM | Admit: 2021-11-20 | Discharge: 2021-11-20 | Disposition: A | Discharge status: Home / Self Care | Payer: BC Managed Care – PPO | Attending: Emergency Medicine | Admitting: Emergency Medicine

## 2021-11-20 ENCOUNTER — Encounter (HOSPITAL_COMMUNITY): Payer: Self-pay | Admitting: Emergency Medicine

## 2021-11-20 ENCOUNTER — Emergency Department (HOSPITAL_COMMUNITY): Payer: BC Managed Care – PPO

## 2021-11-20 DIAGNOSIS — R112 Nausea with vomiting, unspecified: Secondary | ICD-10-CM | POA: Diagnosis not present

## 2021-11-20 DIAGNOSIS — R7309 Other abnormal glucose: Secondary | ICD-10-CM

## 2021-11-20 DIAGNOSIS — R1031 Right lower quadrant pain: Secondary | ICD-10-CM | POA: Diagnosis present

## 2021-11-20 DIAGNOSIS — Z7982 Long term (current) use of aspirin: Secondary | ICD-10-CM | POA: Diagnosis not present

## 2021-11-20 DIAGNOSIS — R109 Unspecified abdominal pain: Secondary | ICD-10-CM

## 2021-11-20 LAB — URINALYSIS, ROUTINE W REFLEX MICROSCOPIC
Bacteria, UA: NONE SEEN
Bilirubin Urine: NEGATIVE
Glucose, UA: NEGATIVE mg/dL
Hgb urine dipstick: NEGATIVE
Ketones, ur: NEGATIVE mg/dL
Nitrite: NEGATIVE
Protein, ur: NEGATIVE mg/dL
Specific Gravity, Urine: 1.013 (ref 1.005–1.030)
pH: 7 (ref 5.0–8.0)

## 2021-11-20 LAB — COMPREHENSIVE METABOLIC PANEL
ALT: 35 U/L (ref 0–44)
AST: 25 U/L (ref 15–41)
Albumin: 4.3 g/dL (ref 3.5–5.0)
Alkaline Phosphatase: 66 U/L (ref 38–126)
Anion gap: 10 (ref 5–15)
BUN: 15 mg/dL (ref 8–23)
CO2: 29 mmol/L (ref 22–32)
Calcium: 10.2 mg/dL (ref 8.9–10.3)
Chloride: 102 mmol/L (ref 98–111)
Creatinine, Ser: 1.53 mg/dL — ABNORMAL HIGH (ref 0.61–1.24)
GFR, Estimated: 50 mL/min — ABNORMAL LOW (ref >60)
Glucose, Bld: 133 mg/dL — ABNORMAL HIGH (ref 70–99)
Potassium: 3.8 mmol/L (ref 3.5–5.1)
Sodium: 141 mmol/L (ref 135–145)
Total Bilirubin: 0.9 mg/dL (ref 0.3–1.2)
Total Protein: 7.7 g/dL (ref 6.5–8.1)

## 2021-11-20 LAB — CBC WITH DIFFERENTIAL/PLATELET
Abs Immature Granulocytes: 0.04 10*3/uL (ref 0.00–0.07)
Basophils Absolute: 0.1 10*3/uL (ref 0.0–0.1)
Basophils Relative: 1 %
Eosinophils Absolute: 0 10*3/uL (ref 0.0–0.5)
Eosinophils Relative: 0 %
HCT: 48.8 % (ref 39.0–52.0)
Hemoglobin: 17 g/dL (ref 13.0–17.0)
Immature Granulocytes: 0 %
Lymphocytes Relative: 12 %
Lymphs Abs: 1.2 10*3/uL (ref 0.7–4.0)
MCH: 31 pg (ref 26.0–34.0)
MCHC: 34.8 g/dL (ref 30.0–36.0)
MCV: 89.1 fL (ref 80.0–100.0)
Monocytes Absolute: 0.6 10*3/uL (ref 0.1–1.0)
Monocytes Relative: 6 %
Neutro Abs: 8.1 10*3/uL — ABNORMAL HIGH (ref 1.7–7.7)
Neutrophils Relative %: 81 %
Platelets: 237 10*3/uL (ref 150–400)
RBC: 5.48 MIL/uL (ref 4.22–5.81)
RDW: 12.8 % (ref 11.5–15.5)
WBC: 10 10*3/uL (ref 4.0–10.5)
nRBC: 0 % (ref 0.0–0.2)

## 2021-11-20 LAB — LIPASE, BLOOD: Lipase: 32 U/L (ref 11–51)

## 2021-11-20 MED ORDER — IOHEXOL 300 MG/ML  SOLN
100.0000 mL | Freq: Once | INTRAMUSCULAR | Status: AC | PRN
  Administered 2021-11-20: 100 mL via INTRAVENOUS

## 2021-11-20 MED ORDER — HYDROMORPHONE HCL 2 MG/ML IJ SOLN: 1.0000 mg | Freq: Once | INTRAMUSCULAR | Status: DC

## 2021-11-20 MED ORDER — LOSARTAN POTASSIUM-HCTZ 50-12.5 MG PO TABS: 1.0000 | ORAL_TABLET | Freq: Every day | ORAL | 3 refills | Status: DC

## 2021-11-20 MED ORDER — PIOGLITAZONE HCL 15 MG PO TABS: 15.0000 mg | ORAL_TABLET | Freq: Every day | ORAL | 3 refills | Status: DC

## 2021-11-20 MED ORDER — ONDANSETRON 4 MG PO TBDP
4.0000 mg | ORAL_TABLET | Freq: Once | ORAL | Status: AC
  Administered 2021-11-20: 4 mg via ORAL
  Filled 2021-11-20: qty 1

## 2021-11-20 MED ORDER — SODIUM CHLORIDE 0.9 % IV BOLUS
1000.0000 mL | Freq: Once | INTRAVENOUS | Status: AC
  Administered 2021-11-20: 1000 mL via INTRAVENOUS

## 2021-11-20 MED ORDER — HYDROMORPHONE HCL 2 MG/ML IJ SOLN
1.0000 mg | Freq: Once | INTRAMUSCULAR | Status: AC
  Administered 2021-11-20: 1 mg via INTRAVENOUS
  Filled 2021-11-20: qty 1

## 2021-11-20 NOTE — ED Triage Notes (Signed)
BIBA Per EMS: Pt coming home w/ c/o kidney stones. Last night pt developed extreme pain. N/V as well. '4mg'$  zofran given en route.  20 R hand  VSS

## 2021-11-20 NOTE — ED Provider Triage Note (Signed)
Emergency Medicine Provider Triage Evaluation Note  Ruben West , a 66 y.o. male  was evaluated in triage.  Pt complains of flank pain. Pt right R flank pain, diagnosed with kidney stone on 8/15.  Here with persistent nausea and vomiting since last night. Endorse chills, still having flank pain.   Review of Systems  Positive: As above Negative: As above  Physical Exam  BP 126/84 (BP Location: Left Arm)   Pulse 71   Temp 98 F (36.7 C) (Oral)   Resp 15   SpO2 93%  Gen:   Awake, no distress   Resp:  Normal effort  MSK:   Moves extremities without difficulty  Other:    Medical Decision Making  Medically screening exam initiated at 2:25 PM.  Appropriate orders placed.  Ruben West was informed that the remainder of the evaluation will be completed by another provider, this initial triage assessment does not replace that evaluation, and the importance of remaining in the ED until their evaluation is complete.     Domenic Moras, PA-C 11/20/21 1430

## 2021-11-20 NOTE — ED Provider Notes (Signed)
[facility] DEPT Provider Note   CSN: [id number] Arrival date & time: 11/20/21  1358     History  Chief Complaint  Patient presents with   Flank Pain   Emesis    Ruben West is a 66 y.o. male with recent history of renal stone and fatty liver disease presenting for abdominal pain.  Reports that on August 15 he presented to the emergency department for right lower quadrant pain and was evaluated for possible appendicitis.  CT scan was negative for appendicitis but did reveal a 3 mm kidney stone.  Was discharged home and advised to be seen again for painful urination right lower quadrant pain and nausea vomiting.  Started to experience those symptoms yesterday evening.  She assumed that kidney stone might be passing.  Pain has been persistent crampy and sharp.  Received Dilaudid on arrival which helped with pain.  Pain radiates to the back and groin.   Flank Pain  Emesis      Home Medications Prior to Admission medications   Medication Sig Start Date End Date Taking? Authorizing Provider  aspirin EC 81 MG tablet Take 81 mg by mouth every morning.    [provider]  Cholecalciferol (VITAMIN D-3) 25 MCG (1000 UT) CAPS Take 1 capsule by mouth daily.    [provider]  hydrocortisone 2.5 % cream Apply topically 2 (two) times daily. 06/22/21   Lavonna Monarch, MD  lidocaine (LIDODERM) 5 % Place 2 patches onto the skin daily. Put on neck as directed at night for 12 hours or so- Remove & Discard patch within 12 hours or as directed by MD 08/27/20   Lovorn, Jinny Blossom, MD  losartan-hydrochlorothiazide (HYZAAR) 50-12.5 MG tablet Take 1 tablet by mouth daily. 11/20/21   Biagio Borg, MD  lovastatin (MEVACOR) 40 MG tablet 1 tab by mouth once daily 12/08/20   Biagio Borg, MD  MAGNESIUM PO Take by mouth.    [provider]  NURTEC 75 MG TBDP TAKE 1 TABLET BY MOUTH AS NEEDED FOR MIGRAINE FIRST 15 MIN 06/14/21   Lovorn, Jinny Blossom, MD  ondansetron  (ZOFRAN ODT) 4 MG disintegrating tablet Take 1 tablet (4 mg total) by mouth every 8 (eight) hours as needed for nausea or vomiting. 11/10/20   Narda Amber K, DO  pioglitazone (ACTOS) 15 MG tablet Take 1 tablet (15 mg total) by mouth daily. 11/20/21   Biagio Borg, MD  POTASSIUM PO Take by mouth.    [provider]  sildenafil (VIAGRA) 100 MG tablet Take 0.5-1 tablets (50-100 mg total) by mouth daily as needed for erectile dysfunction. 12/04/19   Biagio Borg, MD  VITAMIN D PO Take by mouth.    [provider]  zolmitriptan (ZOMIG-ZMT) 5 MG disintegrating tablet DISSOLVE 1 TABLET ON TONGUE AS NEEDED FOR MIGRAINE 05/09/20   Lovorn, Jinny Blossom, MD      Allergies    Gabapentin    Review of Systems   Review of Systems  Gastrointestinal:  Positive for vomiting.  Genitourinary:  Positive for flank pain.    Physical Exam Updated Vital Signs BP (!) 150/88   Pulse 74   Temp 98.1 F (36.7 C) (Oral)   Resp 14   SpO2 95%  Physical Exam Vitals and nursing note reviewed.  HENT:     Head: Normocephalic and atraumatic.     Mouth/Throat:     Mouth: Mucous membranes are moist.  Eyes:     General:  Right eye: No discharge.        Left eye: No discharge.     Conjunctiva/sclera: Conjunctivae normal.  Cardiovascular:     Rate and Rhythm: Normal rate and regular rhythm.     Pulses: Normal pulses.     Heart sounds: Normal heart sounds.  Pulmonary:     Effort: Pulmonary effort is normal.     Breath sounds: Normal breath sounds.  Abdominal:     General: Abdomen is flat.     Palpations: Abdomen is soft.     Tenderness: There is abdominal tenderness in the right lower quadrant. There is no right CVA tenderness or left CVA tenderness.  Skin:    General: Skin is warm and dry.  Neurological:     General: No focal deficit present.  Psychiatric:        Mood and Affect: Mood normal.     ED Results / Procedures / Treatments   Labs (all labs ordered are listed, but only  abnormal results are displayed) Labs Reviewed  CBC WITH DIFFERENTIAL/PLATELET - Abnormal; Notable for the following components:      Result Value   Neutro Abs 8.1 (*)    All other components within normal limits  COMPREHENSIVE METABOLIC PANEL - Abnormal; Notable for the following components:   Glucose, Bld 133 (*)    Creatinine, Ser 1.53 (*)    GFR, Estimated 50 (*)    All other components within normal limits  URINALYSIS, ROUTINE W REFLEX MICROSCOPIC - Abnormal; Notable for the following components:   Leukocytes,Ua TRACE (*)    All other components within normal limits  LIPASE, BLOOD    EKG None  Radiology CT Abdomen Pelvis W Contrast  Result Date: 11/20/2021 CLINICAL DATA:  Right lower quadrant abdominal pain EXAM: CT ABDOMEN AND PELVIS WITH CONTRAST TECHNIQUE: Multidetector CT imaging of the abdomen and pelvis was performed using the standard protocol following bolus administration of intravenous contrast. RADIATION DOSE REDUCTION: This exam was performed according to the departmental dose-optimization program which includes automated exposure control, adjustment of the mA and/or kV according to patient size and/or use of iterative reconstruction technique. CONTRAST:  187m OMNIPAQUE IOHEXOL 300 MG/ML  SOLN COMPARISON:  None Available. FINDINGS: Lower chest: Lung bases are clear. Hepatobiliary: Liver is within normal limits. Gallbladder is unremarkable. No intrahepatic or extrahepatic ductal dilatation. Pancreas: Within normal limits.  No pancreatic ductal dilatation. Spleen: Within normal limits. Adrenals/Urinary Tract: Adrenal glands are within normal limits. Kidneys are within normal limits.  No hydronephrosis. Bladder is within normal limits. Stomach/Bowel: Stomach is within normal limits. No evidence of bowel obstruction. Suspected duodenal diverticuli along the 2nd/3rd portion of the duodenum (series 2/image 35), adjacent to the pancreatic head/uncinate process but not favored to  reflect pancreatic cysts. Normal appendix (series 2/image 69). Scattered right colonic diverticuli, without evidence of diverticulitis. Vascular/Lymphatic: CT aneurysm Atherosclerotic calcifications of the abdominal aorta and branch vessels. No suspicious abdominopelvic lymphadenopathy. Reproductive: Prostate is unremarkable. Other: No abdominopelvic ascites. Small fat containing periumbilical hernia (series 2/image 62). Musculoskeletal: Mild degenerative changes of the visualized thoracolumbar spine. IMPRESSION: Normal appendix. No CT findings to account for the patient's right lower quadrant abdominal pain. Aortic Atherosclerosis (ICD10-I70.0). Electronically Signed   By: SJulian HyM.D.   On: 11/20/2021 21:37   [place]  Result Date: 11/20/2021 CLINICAL DATA:  Right flank pain EXAM: RENAL / URINARY TRACT ULTRASOUND COMPLETE COMPARISON:  11/16/2021 FINDINGS: Right Kidney: Renal measurements: 11.3 x 5.8 x 4.8 cm = volume: 164 mL. Echogenicity  within normal limits. No mass or hydronephrosis visualized. Left Kidney: Renal measurements: 10.0 x 5.4 x 4.3 cm = volume: 121 mL. Echogenicity within normal limits. No mass or hydronephrosis visualized. Bladder: Appears normal for degree of bladder distention. Other: None. IMPRESSION: Unremarkable ultrasound of the kidneys and urinary bladder. Electronically Signed   By: Davina Poke D.O.   On: 11/20/2021 15:45    Procedures Procedures    Medications Ordered in ED Medications  ondansetron (ZOFRAN-ODT) disintegrating tablet 4 mg (4 mg Oral Given 11/20/21 1504)  HYDROmorphone (DILAUDID) injection 1 mg (1 mg Intravenous Given 11/20/21 1504)  sodium chloride 0.9 % bolus 1,000 mL (1,000 mLs Intravenous New Bag/Given 11/20/21 2010)  iohexol (OMNIPAQUE) 300 MG/ML solution 100 mL (100 mLs Intravenous Contrast Given 11/20/21 2106)    ED Course/ Medical Decision Making/ A&P                           Medical Decision Making Amount and/or Complexity of Data  Reviewed Radiology: ordered.   This patient presents to the ED for concern of abdominal pain, this involves a number of treatment options, and is a complaint that carries with it a high risk of complications and morbidity.  The differential diagnosis includes pyelonephritis, appendicitis, ureterolithiasis, and acute cystitis.   Co morbidities: Discussed in HPI  EMR reviewed including pt PMHx, past surgical history and past visits to ER.   See HPI for more details   Lab Tests:   I ordered and independently interpreted labs. Labs notable for hyperglycemia and elevated creatinine.   Imaging Studies:  NAD. I personally reviewed all imaging studies and no acute abnormality found. I agree with radiology interpretation.    Cardiac Monitoring:  The patient was maintained on a cardiac monitor.  I personally viewed and interpreted the cardiac monitored which showed an underlying rhythm of: NSR NA   Medicines ordered:  I ordered medication including Dilaudid for pain, Zofran for nausea, normal saline bolus for fluid resuscitation. Reevaluation of the patient after these medicines showed that the patient improved I have reviewed the patients home medicines and have made adjustments as needed    Consults/Attending Physician   I discussed this case with my attending physician who cosigned this note including patient's presenting symptoms, physical exam, and planned diagnostics and interventions. Attending physician stated agreement with plan or made changes to plan which were implemented.   Reevaluation:  After the interventions noted above I re-evaluated patient and found that they have : Improved     Problem List / ED Course: Patient concern for abdominal pain.  Considered appendicitis, pyelonephritis, urolithiasis but unlikely given findings from CT scan and renal ultrasound.  Treated pain and nausea and volume resuscitated.  Upon reevaluation, patient stated that pain was  much better.  No issues with p.o. challenge.  Advised that he take Tylenol and ibuprofen for pain as needed.  Discussed return precautions.  Advised to follow-up with PCP regarding ongoing abdominal pain.   Dispostion:  After consideration of the diagnostic results and the patients response to treatment, I feel that the patent would benefit from discharge to home and follow-up with PCP regarding ongoing abdominal pain.         Final Clinical Impression(s) / ED Diagnoses Final diagnoses:  Abdominal pain, unspecified abdominal location    Rx / DC Orders ED Discharge Orders     None         Harriet Pho, PA-C 11/20/21 2240  Lacretia Leigh, MD 11/23/21 0700

## 2021-11-20 NOTE — Discharge Instructions (Addendum)
Please follow up with your primary doctor within the next 5-7 days.  If you do not have a primary care provider, information for a healthcare clinic has been provided for you to make arrangements for follow up care.  Recommend ibuprofen and Tylenol for pain.  Please return to the ER sooner if you have any new or worsening symptoms, or if you have any of the following symptoms:  Abdominal pain that does not go away.  You have a fever.  You keep throwing up (vomiting).  The pain is felt only in portions of the abdomen. Pain in the right side could possibly be appendicitis. In an adult, pain in the left lower portion of the abdomen could be colitis or diverticulitis.  You pass bloody or black tarry stools.  There is bright red blood in the stool.  The constipation stays for more than 4 days.  There is belly (abdominal) or rectal pain.  You do not seem to be getting better.  You have any questions or concerns.

## 2021-11-25 ENCOUNTER — Encounter: Payer: Self-pay | Admitting: Internal Medicine

## 2021-11-25 ENCOUNTER — Other Ambulatory Visit: Payer: Self-pay | Admitting: Internal Medicine

## 2021-11-25 ENCOUNTER — Ambulatory Visit: Payer: BC Managed Care – PPO | Admitting: Internal Medicine

## 2021-11-25 VITALS — BP 136/80 | HR 86 | Temp 98.1°F | Ht 68.0 in | Wt 243.0 lb

## 2021-11-25 DIAGNOSIS — E7800 Pure hypercholesterolemia, unspecified: Secondary | ICD-10-CM | POA: Diagnosis not present

## 2021-11-25 DIAGNOSIS — N1832 Chronic kidney disease, stage 3b: Secondary | ICD-10-CM

## 2021-11-25 DIAGNOSIS — R7309 Other abnormal glucose: Secondary | ICD-10-CM

## 2021-11-25 DIAGNOSIS — E538 Deficiency of other specified B group vitamins: Secondary | ICD-10-CM

## 2021-11-25 DIAGNOSIS — E559 Vitamin D deficiency, unspecified: Secondary | ICD-10-CM | POA: Diagnosis not present

## 2021-11-25 DIAGNOSIS — N200 Calculus of kidney: Secondary | ICD-10-CM | POA: Insufficient documentation

## 2021-11-25 DIAGNOSIS — Z125 Encounter for screening for malignant neoplasm of prostate: Secondary | ICD-10-CM | POA: Diagnosis not present

## 2021-11-25 LAB — CBC WITH DIFFERENTIAL/PLATELET
Basophils Absolute: 0.1 10*3/uL (ref 0.0–0.1)
Basophils Relative: 0.9 % (ref 0.0–3.0)
Eosinophils Absolute: 0.1 10*3/uL (ref 0.0–0.7)
Eosinophils Relative: 1.3 % (ref 0.0–5.0)
HCT: 48.7 % (ref 39.0–52.0)
Hemoglobin: 16.8 g/dL (ref 13.0–17.0)
Lymphocytes Relative: 27.9 % (ref 12.0–46.0)
Lymphs Abs: 2.7 10*3/uL (ref 0.7–4.0)
MCHC: 34.5 g/dL (ref 30.0–36.0)
MCV: 88.2 fl (ref 78.0–100.0)
Monocytes Absolute: 1 10*3/uL (ref 0.1–1.0)
Monocytes Relative: 10.2 % (ref 3.0–12.0)
Neutro Abs: 5.8 10*3/uL (ref 1.4–7.7)
Neutrophils Relative %: 59.7 % (ref 43.0–77.0)
Platelets: 226 10*3/uL (ref 150.0–400.0)
RBC: 5.52 Mil/uL (ref 4.22–5.81)
RDW: 12.9 % (ref 11.5–15.5)
WBC: 9.7 10*3/uL (ref 4.0–10.5)

## 2021-11-25 LAB — URINALYSIS, ROUTINE W REFLEX MICROSCOPIC
Bilirubin Urine: NEGATIVE
Ketones, ur: NEGATIVE
Nitrite: NEGATIVE
Specific Gravity, Urine: 1.01 (ref 1.000–1.030)
Total Protein, Urine: NEGATIVE
Urine Glucose: NEGATIVE
Urobilinogen, UA: 0.2 (ref 0.0–1.0)
pH: 6.5 (ref 5.0–8.0)

## 2021-11-25 LAB — BASIC METABOLIC PANEL
BUN: 19 mg/dL (ref 6–23)
CO2: 29 mEq/L (ref 19–32)
Calcium: 9.8 mg/dL (ref 8.4–10.5)
Chloride: 98 mEq/L (ref 96–112)
Creatinine, Ser: 1.75 mg/dL — ABNORMAL HIGH (ref 0.40–1.50)
GFR: 40.15 mL/min — ABNORMAL LOW (ref >60.00)
Glucose, Bld: 110 mg/dL — ABNORMAL HIGH (ref 70–99)
Potassium: 4.3 mEq/L (ref 3.5–5.1)
Sodium: 139 mEq/L (ref 135–145)

## 2021-11-25 LAB — HEPATIC FUNCTION PANEL
ALT: 26 U/L (ref 0–53)
AST: 19 U/L (ref 0–37)
Albumin: 4.3 g/dL (ref 3.5–5.2)
Alkaline Phosphatase: 69 U/L (ref 39–117)
Bilirubin, Direct: 0.3 mg/dL (ref 0.0–0.3)
Total Bilirubin: 1.2 mg/dL (ref 0.2–1.2)
Total Protein: 7.5 g/dL (ref 6.0–8.3)

## 2021-11-25 LAB — TSH: TSH: 3.87 u[IU]/mL (ref 0.35–5.50)

## 2021-11-25 LAB — LIPID PANEL
Cholesterol: 136 mg/dL (ref 0–200)
HDL: 41.6 mg/dL (ref >39.00)
LDL Cholesterol: 64 mg/dL (ref 0–99)
NonHDL: 94.19
Total CHOL/HDL Ratio: 3
Triglycerides: 152 mg/dL — ABNORMAL HIGH (ref 0.0–149.0)
VLDL: 30.4 mg/dL (ref 0.0–40.0)

## 2021-11-25 LAB — PSA: PSA: 0.47 ng/mL (ref 0.10–4.00)

## 2021-11-25 LAB — HEMOGLOBIN A1C: Hgb A1c MFr Bld: 5.3 % (ref 4.6–6.5)

## 2021-11-25 LAB — VITAMIN D 25 HYDROXY (VIT D DEFICIENCY, FRACTURES): VITD: 30.83 ng/mL (ref 30.00–100.00)

## 2021-11-25 LAB — VITAMIN B12: Vitamin B-12: 530 pg/mL (ref 211–911)

## 2021-11-25 MED ORDER — HYDROMORPHONE HCL 2 MG PO TABS: 2.0000 mg | ORAL_TABLET | Freq: Four times a day (QID) | ORAL | 0 refills | Status: DC | PRN

## 2021-11-25 NOTE — Assessment & Plan Note (Signed)
Lab Results  Component Value Date   LDLCALC 64 11/25/2021   Stable, pt to continue current statin lovastatin 40 mg qd

## 2021-11-25 NOTE — Progress Notes (Signed)
Patient ID: Ruben West, male   DOB: 02-Dec-1955, 66 y.o.   MRN: [id number]

## 2021-11-25 NOTE — Assessment & Plan Note (Signed)
Last vitamin D Lab Results  Component Value Date   VD25OH 30.31 12/08/2020   Low, to start oral replacement

## 2021-11-25 NOTE — Patient Instructions (Addendum)
Please continue all other medications as before, and refills have been done if requested - dilaudid  Please have the pharmacy call with any other refills you may need.  Please continue your efforts at being more active, low cholesterol diet, and weight control.  Please keep your appointments with your specialists as you may have planned - urology for the active kidney stone  Please go to the LAB at the blood drawing area for the tests to be done  You will be contacted by phone if any changes need to be made immediately.  Otherwise, you will receive a letter about your results with an explanation, but please check with MyChart first.  Please remember to sign up for MyChart if you have not done so, as this will be important to you in the future with finding out test results, communicating by private email, and scheduling acute appointments online when needed.  Please make an Appointment to return Sept 25, 2023 or sooner if needed

## 2021-11-25 NOTE — Progress Notes (Signed)
Patient ID: Ruben West, male   DOB: [birth year], 66 y.o.   MRN: [id number]        Chief Complaint: follow up active renal stone       HPI:  Ruben West is a 66 y.o. male here with c/o right side and RLQ pain, mod to severe, constant with flares, ran out of dilaudid 2 mg prn #15 tabs from Ed visit  8/25, trying to use sparingly.  Does have referral in to urology, with suggestion of a plan for a procedure to retrieve the stone, but f/u appt with [facility] not yet set nor other details.  Denies urinary symptoms such as dysuria, frequency, urgency, hematuria or n/v, fever, chills.  Pt denies chest pain, increased sob or doe, wheezing, orthopnea, PND, increased LE swelling, palpitations, dizziness or syncope.   Pt denies polydipsia, polyuria, or new focal neuro s/s.          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 His smoking use included cigars. He has never used smokeless tobacco. He reports that he does not currently use alcohol. He reports that he does not use drugs. family history includes Emphysema in his father; Healthy in  his daughter; Heart attack in his mother; Seizures in his grandchild and mother. Allergies  Allergen Reactions   Gabapentin Nausea And Vomiting   Current Outpatient Medications on File Prior to Visit  Medication Sig Dispense Refill   aspirin EC 81 MG tablet Take 81 mg by mouth every morning.     Cholecalciferol (VITAMIN D-3) 25 MCG (1000 UT) CAPS Take 1 capsule by mouth daily.     hydrocortisone 2.5 % cream Apply topically 2 (two) times daily. 28 g 8   lidocaine (LIDODERM) 5 % Place 2 patches onto the skin daily. Put on neck as directed at night for 12 hours or so- Remove & Discard patch within 12 hours or as directed by MD 30 patch 5   losartan-hydrochlorothiazide (HYZAAR) 50-12.5 MG tablet Take 1 tablet by mouth daily. 90 tablet 3   lovastatin (MEVACOR) 40 MG tablet 1 tab by mouth once daily 90 tablet 3   MAGNESIUM PO Take by mouth.     NURTEC 75 MG TBDP TAKE 1 TABLET BY MOUTH AS NEEDED FOR MIGRAINE FIRST 15 MIN 16 tablet 5   ondansetron (ZOFRAN ODT) 4 MG disintegrating tablet Take 1 tablet (4 mg total) by mouth every 8 (eight)  hours as needed for nausea or vomiting. 20 tablet 1   pioglitazone (ACTOS) 15 MG tablet Take 1 tablet (15 mg total) by mouth daily. 90 tablet 3   POTASSIUM PO Take by mouth.     sildenafil (VIAGRA) 100 MG tablet Take 0.5-1 tablets (50-100 mg total) by mouth daily as needed for erectile dysfunction. 5 tablet 11   VITAMIN D PO Take by mouth.     zolmitriptan (ZOMIG-ZMT) 5 MG disintegrating tablet DISSOLVE 1 TABLET ON TONGUE AS NEEDED FOR MIGRAINE 10 tablet 5   No current facility-administered medications on file prior to visit.        ROS:  All others reviewed and negative.  Objective        PE:  BP 136/80 (BP Location: Right Arm, Patient Position: Sitting, Cuff Size: Large)   Pulse 86   Temp 98.1 F (36.7 C) (Oral)   Ht '5\' 8"'$  (1.727 m)   Wt 243 lb (110.2 kg)   SpO2 91%   BMI 36.95 kg/m                 Constitutional: Pt appears in NAD                HENT: Head: NCAT.                Right Ear: External ear normal.                 Left Ear: External ear normal.                Eyes: . Pupils are equal, round, and reactive to light. Conjunctivae and EOM are normal               Nose: without d/c or deformity               Neck: Neck supple. Gross normal ROM               Cardiovascular: Normal rate and regular rhythm.                 Pulmonary/Chest: Effort normal and breath sounds without rales or wheezing.                Abd:  Soft, NT, ND, + BS, no organomegaly               Neurological: Pt is alert. At baseline orientation, motor grossly intact               Skin: Skin is warm. No rashes, no other new lesions, LE edema - none               Psychiatric: Pt behavior is normal without agitation   Micro: none  Cardiac tracings I have personally interpreted today:  none  Pertinent Radiological findings (summarize): none   Lab Results  Component Value Date   WBC 9.7 11/25/2021   HGB 16.8 11/25/2021   HCT 48.7 11/25/2021   PLT 226.0 11/25/2021   GLUCOSE 110 (H) 11/25/2021   CHOL 136 11/25/2021   TRIG 152.0 (H) 11/25/2021   HDL 41.60 11/25/2021   LDLDIRECT 58.0 12/08/2020   LDLCALC 64 11/25/2021   ALT 26 11/25/2021   AST 19 11/25/2021   NA 139 11/25/2021   K 4.3 11/25/2021   CL 98 11/25/2021   CREATININE 1.75 (H) 11/25/2021   BUN 19 11/25/2021   CO2 29 11/25/2021   TSH 3.87 11/25/2021   PSA 0.47 11/25/2021  HGBA1C 5.3 11/25/2021   Assessment/Plan:  Ruben West is a 66 y.o. White or Caucasian [1] male with  has a past medical history of ALLERGIC RHINITIS (11/23/2006), Back pain, ERECTILE DYSFUNCTION, ORGANIC (04/04/2007), FATTY LIVER DISEASE (05/09/2008), GERD (04/04/2007), Headache, Heart murmur, Hypercholesteremia, HYPERGLYCEMIA (04/04/2007), HYPERTENSION (11/23/2006), Migraines, NASH (nonalcoholic steatohepatitis), Other testicular hypofunction (05/19/2007), PUD (peptic ulcer disease), and Sleep apnea.  Vitamin D  deficiency Last vitamin D Lab Results  Component Value Date   VD25OH 30.31 12/08/2020   Low, to start oral replacement   HYPERGLYCEMIA Lab Results  Component Value Date   HGBA1C 5.3 11/25/2021   Stable, pt to continue current medical treatment actos 15 mg qd   Hyperlipidemia Lab Results  Component Value Date   LDLCALC 64 11/25/2021   Stable, pt to continue current statin lovastatin 40 mg qd   Right renal stone With persistent pain, for dilaudid refill, and f/u urology as planned, also for f/u renal function today Followup: Return in about 26 days (around 12/21/2021), or if symptoms worsen or fail to improve.  Cathlean Cower, MD 11/25/2021 7:20 PM [facility]

## 2021-11-25 NOTE — Assessment & Plan Note (Signed)
Lab Results  Component Value Date   HGBA1C 5.3 11/25/2021   Stable, pt to continue current medical treatment actos 15 mg qd  

## 2021-11-25 NOTE — Assessment & Plan Note (Signed)
With persistent pain, for dilaudid refill, and f/u urology as planned, also for f/u renal function today

## 2021-12-02 ENCOUNTER — Other Ambulatory Visit: Payer: Self-pay | Admitting: Urology

## 2021-12-03 ENCOUNTER — Encounter (HOSPITAL_BASED_OUTPATIENT_CLINIC_OR_DEPARTMENT_OTHER): Payer: Self-pay | Admitting: Urology

## 2021-12-03 NOTE — H&P (Signed)
Office Visit Report     12/01/2021   --------------------------------------------------------------------------------   Ruben West  MRN: [id number]  DOB: February 07, 1956, 66 year old Male  SSN:    PRIMARY CARE:  Cathlean Cower, MD  REFERRING:  Glena Norfolk. Lovena Neighbours, MD  PROVIDER:  Ellison Hughs, M.D.  TREATING:  Jiles Crocker, NP  LOCATION:  [facility] [phone]     --------------------------------------------------------------------------------   CC/HPI: 11/16/2021:  -CTSS from [place] showed a 3 mm right mid ureteral stone with mild hydronephrosis  -Pain is currently controlled with dilaudid 2 mg  -Main c/o is intermittent nausea that is modestly controlled with zofran.  -Denies fevers, but reports chills with episodes of pain  -AFVSS today and he states that he has been afebrile at home  -He denies dysuria or hematuria  -No prior history of kidney stones   12/01/2021: Pt with above noted hx. U/S at last OV showed some continuation of right-sided hydronephrosis in relation to previously identified right mid ureteral calculus. Patient was given additional prescriptions for tamsulosin, Toradol and Zofran at last visit. Discussion about continued medical expulsive therapy versus definitive ureteroscopy was held. He returns today for follow-up exam and right renal ultrasound.   Patient was seen in the ED on 8/25 with continued/poorly controlled right-sided pain and discomfort as well as associated nausea and vomiting. Repeat CT imaging did not show any obvious hydronephrosis and on my independent review calculus was identified in the right distal ureter which contradicts the interpreting radiologist report of the absence of any obvious renal/ureteral calculi. I measured the right ureteral calculi at approximately 2.13m located at the right UVJ.   Today, UA is clear. Right renal u/s does not show any continuation of previously identified hydronephrosis. KUB shows  an opacity consistent with previously noted distal right ureteral calculi easily visualized distally to the right sacral wing. Patient has stopped tamsulosin. He was having symptoms of orthostasis while on the alpha-blocker therapy, this resolved once he stopped the alpha-blocker therapy. He is continuing efforts at straining his urine and remaining liberally hydrated. He does endorse continuing to be intermittently but mildly symptomatic endorsing some right flank and lower back discomfort but not to the severity in which he presented to the emergency department on 8/25. Last week he did have some mild dysuria and gross hematuria but that is since resolved. He denies any other bothersome lower urinary tract symptomology. He has not had a recurrence of any severe nausea/vomiting. He remains afebrile.     ALLERGIES: Flomax - Dizziness    MEDICATIONS: Ketorolac Tromethamine 10 mg tablet 1 tablet PO Q 6 H PRN  Aspirin Ec 81 mg tablet, delayed release  Hydrocortisone 2.5 % cream  Hydromorphone Hcl  Losartan-Hydrochlorothiazide 50 mg-12.5 mg tablet  Lovastatin 40 mg tablet  Magnesium  Nurtec Odt 75 mg tablet,disintegrating  Ondansetron Hcl 4 mg tablet  Ondansetron Odt 8 mg tablet,disintegrating 1 tablet PO Q 6 H PRN PRN nausea  Pioglitazone Hcl 15 mg tablet  Potassium  Sildenafil Citrate 100 mg tablet  Vitamin D3  Zolmitriptan Odt 5 mg tablet,disintegrating  Zomax     GU PSH: None   NON-GU PSH: None   GU PMH: Flank Pain - 11/16/2021 Ureteral calculus - 11/16/2021    NON-GU PMH: Cardiac murmur, unspecified Hypercholesterolemia Hypertension    FAMILY HISTORY: 1 Daughter - Daughter   SOCIAL HISTORY: Marital Status: Married Preferred Language: English; Ethnicity: Not Hispanic Or Latino; Race: White Current Smoking Status: Patient does not smoke anymore.  Tobacco Use Assessment Completed: Used Tobacco in last 30 days? Light Drinker.  Drinks 2 caffeinated drinks per day.    REVIEW  OF SYSTEMS:    GU Review Male:   Patient denies frequent urination, hard to postpone urination, burning/ pain with urination, get up at night to urinate, leakage of urine, stream starts and stops, trouble starting your stream, have to strain to urinate , erection problems, and penile pain.  Gastrointestinal (Upper):   Patient reports nausea and vomiting. Patient denies indigestion/ heartburn.  Gastrointestinal (Lower):   Patient denies diarrhea and constipation.  Constitutional:   Patient denies fever, night sweats, weight loss, and fatigue.  Skin:   Patient denies itching and skin rash/ lesion.  Eyes:   Patient denies blurred vision and double vision.  Ears/ Nose/ Throat:   Patient denies sore throat and sinus problems.  Hematologic/Lymphatic:   Patient denies swollen glands and easy bruising.  Cardiovascular:   Patient denies leg swelling and chest pains.  Respiratory:   Patient denies cough and shortness of breath.  Endocrine:   Patient denies excessive thirst.  Musculoskeletal:   Patient reports back pain. Patient denies joint pain.  Neurological:   Patient denies headaches and dizziness.  Psychologic:   Patient denies depression and anxiety.   VITAL SIGNS:      12/01/2021 11:35 AM  BP 142/86 mmHg  Pulse 49 /min  Temperature 98.3 F / 36.8 C   MULTI-SYSTEM PHYSICAL EXAMINATION:    Constitutional: Well-nourished. No physical deformities. Normally developed. Good grooming.  Neck: Neck symmetrical, not swollen. Normal tracheal position.  Respiratory: No labored breathing, no use of accessory muscles.   Cardiovascular: Normal temperature, normal extremity pulses, no swelling, no varicosities.  Skin: No paleness, no jaundice, no cyanosis. No lesion, no ulcer, no rash.  Neurologic / Psychiatric: Oriented to time, oriented to place, oriented to person. No depression, no anxiety, no agitation.  Gastrointestinal: Obese abdomen. No mass, no tenderness, no rigidity. No CVA or flank tenderness.   Musculoskeletal: Normal gait and station of head and neck.     Complexity of Data:  Source Of History:  Patient, Medical Record Summary  Records Review:   Previous Doctor Records, Previous Hospital Records, Previous Patient Records  Urine Test Review:   Urinalysis  X-Ray Review: KUB: Reviewed Films. Discussed With Patient.  Renal Ultrasound (Limited): Reviewed Films. Discussed With Patient.  C.T. Abdomen/Pelvis: Reviewed Films. Reviewed Report.     12/01/21  Urinalysis  Urine Appearance Clear   Urine Color Yellow   Urine Glucose Neg mg/dL  Urine Bilirubin Neg mg/dL  Urine Ketones Neg mg/dL  Urine Specific Gravity <=1.005   Urine Blood Neg ery/uL  Urine pH 6.5   Urine Protein Neg mg/dL  Urine Urobilinogen 0.2 mg/dL  Urine Nitrites Neg   Urine Leukocyte Esterase Neg leu/uL   PROCEDURES:         Renal Ultrasound (Limited) - 41660  Kidney:Right Length: 11.4cm Depth: 5.9cm Cortical Width: 1.6cm Width: 5.3cm    Right Kidney/Ureter:  appears WNL   Bladder:  PVR= 91.38m      . Patient confirmed No Neulasta OnPro Device.            KUB - [id number] A single view of the abdomen is obtained. KUB today shows an approximately 3 to 3.3 mm opacity slightly distal to the right sacral wing which correlates to location and size/shape of the distal right UVJ calculus I identified on CT exam obtained in the ED on 8/25. He has  left sided opacities in the pelvic inlet consistent with benign pelvic phleboliths. No other opacity identified consistent with renal or ureteral calculi seen on today's exam.      . Patient confirmed No Neulasta OnPro Device.           Urinalysis - 81003 Dipstick Dipstick Cont'd  Color: Yellow Bilirubin: Neg mg/dL  Appearance: Clear Ketones: Neg mg/dL  Specific Gravity: <=1.005 Blood: Neg ery/uL  pH: 6.5 Protein: Neg mg/dL  Glucose: Neg mg/dL Urobilinogen: 0.2 mg/dL    Nitrites: Neg    Leukocyte Esterase: Neg leu/uL    ASSESSMENT:      ICD-10 Details  1  GU:   Ureteral calculus - N20.1 Right, Acute, Complicated Injury   PLAN:            Medications Stop Meds: Finasteride 5 mg tablet 1 tablet PO Daily  Start: 12/01/2021  Stop: 12/01/2022   Discontinue: 12/01/2021  - Reason: The medication was entered incorrectly.  Tamsulosin Hcl 0.4 mg capsule 1 capsule PO BID  Start: 12/01/2021  Stop: 05/30/2022   Discontinue: 12/01/2021  - Reason: The medication was entered incorrectly.  Polyethylene Glycol 3350 17 gram/dose powder Dissolve and consume PO 1 capful in 8oz of water daily  Start: 12/01/2021  Discontinue: 12/01/2021  - Reason: The medication was entered incorrectly.  Tamsulosin Hcl 0.4 mg capsule  Discontinue: 12/01/2021  - Reason: Made him dizzy           Orders Labs Urine Culture  X-Rays: KUB          Schedule Return Visit/Planned Activity: Next Available Appointment - Follow up MD, Schedule Surgery          Document Letter(s):  Created for Patient: Clinical Summary   Created for Chart: Work Excuse         Notes:   Patient continues to be intermittently symptomatic but doing well today from a distal right ureteral calculus. No obvious obstructive signs noted on today's renal ultrasound, UA is clear. On KUB today, a 3 mm distal right ureteral calculi is easily identified. Patient cannot tolerate tamsulosin due to side effects of orthostasis. He is eager to proceed with definitive management of the calculus.   For ureteroscopy I described the risks which include heart attack, stroke, pulmonary embolus, death, bleeding, infection, damage to contiguous structures, positioning injury, ureteral stricture, ureteral avulsion, ureteral injury, need for ureteral stent, inability to perform ureteroscopy, need for an interval procedure, inability to clear stone burden, stent discomfort and pain.   For shockwave lithotripsy I described the risks which include arrhythmia, kidney contusion, kidney hemorrhage, need for transfusion, long-term risk  of diabetes or hypertension, back discomfort, flank ecchymosis, flank abrasion, inability to break up stone, inability to pass stone fragments, Steinstrasse, infection associated with obstructing stones, need for different surgical procedure and possible need for repeat shockwave lithotripsy.   Given stones visibility, he would be a candidate for shockwave lithotripsy and patient would prefer this option over ureteroscopy. He understands I will need to consult with his urologist before being appropriately scheduled. He is agreeable to follow the recommendations provided by Dr. Lovena Neighbours. For now he will continue staying liberally hydrated and monitoring for any interval stone material passage. He has pain medication, antinausea medication to take as needed. We discussed appropriate return to clinic, ED follow-up instructions for severe and uncontrollable exacerbation of symptoms. Once I have discussed with his urologist, the patient will be contacted and scheduled appropriately.    * Signed by Jiles Crocker,  NP on 12/01/21 at 12:09 PM (EDT)*      The information contained in this medical record document is considered private and confidential patient information. This information can only be used for the medical diagnosis and/or medical services that are being provided by the patient's selected caregivers. This information can only be distributed outside of the patient's care if the patient agrees and signs waivers of authorization for this information to be sent to an outside source or route.

## 2021-12-03 NOTE — Progress Notes (Signed)
Pre-procedure call completed for Lithotripsy.  Patient will bring CPAP machine.  Caregiver will be his wife Ruben West.  Instructions reviewed with patient and he verbalized understanding.  Arrival time 10:30 a.m.

## 2021-12-07 ENCOUNTER — Other Ambulatory Visit: Payer: Self-pay

## 2021-12-07 ENCOUNTER — Encounter (HOSPITAL_BASED_OUTPATIENT_CLINIC_OR_DEPARTMENT_OTHER): Payer: Self-pay | Admitting: Urology

## 2021-12-07 ENCOUNTER — Ambulatory Visit (HOSPITAL_COMMUNITY): Payer: BC Managed Care – PPO

## 2021-12-07 ENCOUNTER — Ambulatory Visit (HOSPITAL_BASED_OUTPATIENT_CLINIC_OR_DEPARTMENT_OTHER)
Admission: RE | Admit: 2021-12-07 | Discharge: 2021-12-07 | Disposition: A | Discharge status: Home / Self Care | Payer: BC Managed Care – PPO | Attending: Urology | Admitting: Urology

## 2021-12-07 ENCOUNTER — Encounter (HOSPITAL_BASED_OUTPATIENT_CLINIC_OR_DEPARTMENT_OTHER)
Admission: RE | Disposition: A | Discharge status: Home / Self Care | Payer: Self-pay | Source: Home / Self Care | Attending: Urology

## 2021-12-07 DIAGNOSIS — E669 Obesity, unspecified: Secondary | ICD-10-CM | POA: Insufficient documentation

## 2021-12-07 DIAGNOSIS — G4730 Sleep apnea, unspecified: Secondary | ICD-10-CM | POA: Diagnosis not present

## 2021-12-07 DIAGNOSIS — Z79899 Other long term (current) drug therapy: Secondary | ICD-10-CM | POA: Insufficient documentation

## 2021-12-07 DIAGNOSIS — Z6837 Body mass index (BMI) 37.0-37.9, adult: Secondary | ICD-10-CM | POA: Diagnosis not present

## 2021-12-07 DIAGNOSIS — I10 Essential (primary) hypertension: Secondary | ICD-10-CM | POA: Diagnosis not present

## 2021-12-07 DIAGNOSIS — N201 Calculus of ureter: Secondary | ICD-10-CM | POA: Insufficient documentation

## 2021-12-07 HISTORY — PX: EXTRACORPOREAL SHOCK WAVE LITHOTRIPSY: SHX1557

## 2021-12-07 SURGERY — LITHOTRIPSY, ESWL
Anesthesia: LOCAL | Laterality: Right

## 2021-12-07 MED ORDER — DIPHENHYDRAMINE HCL 25 MG PO CAPS
25.0000 mg | ORAL_CAPSULE | ORAL | Status: AC
  Administered 2021-12-07: 25 mg via ORAL

## 2021-12-07 MED ORDER — CIPROFLOXACIN HCL 500 MG PO TABS
500.0000 mg | ORAL_TABLET | ORAL | Status: AC
  Administered 2021-12-07: 500 mg via ORAL

## 2021-12-07 MED ORDER — DIAZEPAM 5 MG PO TABS
10.0000 mg | ORAL_TABLET | ORAL | Status: AC
  Administered 2021-12-07: 10 mg via ORAL

## 2021-12-07 MED ORDER — CIPROFLOXACIN HCL 500 MG PO TABS
ORAL_TABLET | ORAL | Status: AC
  Filled 2021-12-07: qty 1

## 2021-12-07 MED ORDER — DIPHENHYDRAMINE HCL 25 MG PO CAPS
ORAL_CAPSULE | ORAL | Status: AC
  Filled 2021-12-07: qty 1

## 2021-12-07 MED ORDER — DIAZEPAM 5 MG PO TABS
ORAL_TABLET | ORAL | Status: AC
  Filled 2021-12-07: qty 2

## 2021-12-07 MED ORDER — SODIUM CHLORIDE 0.9 % IV SOLN: INTRAVENOUS | Status: DC

## 2021-12-07 NOTE — Op Note (Addendum)
Right distal ureteral stone 3-4 mm  Right ESWL    Findings: excellent targeting of stone and the stone faded. Energy up to 6 and slowed the rate.  He may need a staged procedure if he fails to pass the stone and / or the stone fragments.

## 2021-12-07 NOTE — Interval H&P Note (Signed)
History and Physical Interval Note:  12/07/2021 12:46 PM  Darletta Moll  has presented today for surgery, with the diagnosis of RIGHT URETERAL CALCULI.  The various methods of treatment have been discussed with the patient and family. After consideration of risks, benefits and other options for treatment, the patient has consented to  Procedure(s): EXTRACORPOREAL SHOCK WAVE LITHOTRIPSY (ESWL) (Right) as a surgical intervention.  The patient's history has been reviewed, patient examined, no change in status, stable for surgery. 4 mm stone right distal persistent on today's KUB. He reports he has not passed a stone and has some RLQ discomfort and urgency. No dysuria or hematuria. No fever, cough or congestion.  I have reviewed the patient's chart and labs.  Questions were answered to the patient's satisfaction.     Festus Aloe

## 2021-12-08 ENCOUNTER — Encounter (HOSPITAL_BASED_OUTPATIENT_CLINIC_OR_DEPARTMENT_OTHER): Payer: Self-pay | Admitting: Urology

## 2021-12-13 ENCOUNTER — Other Ambulatory Visit: Payer: Self-pay | Admitting: Internal Medicine

## 2021-12-13 NOTE — Telephone Encounter (Signed)
Please refill as per office routine med refill policy (all routine meds to be refilled for 3 mo or monthly (per pt preference) up to one year from last visit, then month to month grace period for 3 mo, then further med refills will have to be denied) ? ?

## 2021-12-14 ENCOUNTER — Encounter: Payer: BC Managed Care – PPO | Admitting: Internal Medicine

## 2021-12-15 ENCOUNTER — Telehealth: Payer: Self-pay

## 2021-12-15 NOTE — Telephone Encounter (Signed)
[employer] approved 11/15/21-12/15/22 Case ID [id number]

## 2021-12-21 ENCOUNTER — Ambulatory Visit (INDEPENDENT_AMBULATORY_CARE_PROVIDER_SITE_OTHER): Payer: BC Managed Care – PPO | Admitting: Internal Medicine

## 2021-12-21 VITALS — BP 136/80 | HR 62 | Temp 98.1°F | Ht 68.0 in | Wt 255.0 lb

## 2021-12-21 DIAGNOSIS — E1165 Type 2 diabetes mellitus with hyperglycemia: Secondary | ICD-10-CM

## 2021-12-21 DIAGNOSIS — Z0001 Encounter for general adult medical examination with abnormal findings: Secondary | ICD-10-CM | POA: Diagnosis not present

## 2021-12-21 DIAGNOSIS — Z23 Encounter for immunization: Secondary | ICD-10-CM | POA: Diagnosis not present

## 2021-12-21 DIAGNOSIS — I10 Essential (primary) hypertension: Secondary | ICD-10-CM

## 2021-12-21 DIAGNOSIS — N1832 Chronic kidney disease, stage 3b: Secondary | ICD-10-CM

## 2021-12-21 DIAGNOSIS — E559 Vitamin D deficiency, unspecified: Secondary | ICD-10-CM | POA: Diagnosis not present

## 2021-12-21 DIAGNOSIS — E538 Deficiency of other specified B group vitamins: Secondary | ICD-10-CM | POA: Diagnosis not present

## 2021-12-21 DIAGNOSIS — E7800 Pure hypercholesterolemia, unspecified: Secondary | ICD-10-CM

## 2021-12-21 NOTE — Assessment & Plan Note (Addendum)
Lab Results  Component Value Date   HGBA1C 5.3 11/25/2021   Stable, pt to continue current medical treatment - diet, wt control,excericse and change the actos to mounjaro if ok with insurance for wt loss and sugar

## 2021-12-21 NOTE — Assessment & Plan Note (Signed)
Lab Results  Component Value Date   CREATININE 1.75 (H) 11/25/2021   Stable overall, cont to avoid nephrotoxins

## 2021-12-21 NOTE — Progress Notes (Signed)
Patient ID: Ruben West, male   DOB: 1955-04-28, 66 y.o.   MRN: [id number]         Chief Complaint:: wellness exam and Physical (Muscle soreness in right arm)  , fatty liver, obesity, dm, low vit d, hld, ckd       HPI:  Ruben West is a 66 y.o. male here for wellness exam; plans to call for optho exam himself, declines covid booster, shingrix o/w up to date                        Also has appt with urology on oct 2.  Also has pending appt for nephrology.  Taking actos for fatty liver.  Taking Vit D 1000 u qd, Willing for Card CT score.  Gained over 10 lbs in 1 mo, but plans to do better with diet, had changed to more carbs from sodas due to recent kidney stone. Plans to be more active.  Pt denies chest pain, increased sob or doe, wheezing, orthopnea, PND, increased LE swelling, palpitations, dizziness or syncope.   Pt denies polydipsia, polyuria, or new focal neuro s/s.   Due for prevnar 20 and flu shot Wt Readings from Last 3 Encounters:  12/21/21 255 lb (115.7 kg)  12/07/21 247 lb (112 kg)  11/25/21 243 lb (110.2 kg)   BP Readings from Last 3 Encounters:  12/21/21 136/80  12/07/21 (!) 137/93  11/25/21 136/80   Immunization History  Administered Date(s) Administered   Fluad Quad(high Dose 65+) 12/08/2020, 12/21/2021   Influenza Split 01/29/2011   Influenza, Seasonal, Injecte, Preservative Fre 01/10/2015   Influenza,inj,Quad PF,6+ Mos 02/13/2016, 01/27/2017   Influenza,inj,quad, With Preservative 01/27/2017   Influenza-Unspecified 01/10/2015, 01/27/2017   PFIZER(Purple Top)SARS-COV-2 Vaccination 11/04/2019, 11/25/2019   PNEUMOCOCCAL CONJUGATE-20 12/21/2021   Pneumococcal Polysaccharide-23 12/08/2020   Td 11/28/2002   Tdap 03/04/2014   Zoster, Live 03/04/2014   Health Maintenance Due  Topic Date Due   OPHTHALMOLOGY EXAM  Never done   Diabetic kidney evaluation - Urine ACR  Never done      Past Medical History:  Diagnosis Date   ALLERGIC RHINITIS 11/23/2006   Back pain     ERECTILE DYSFUNCTION, 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   FATTY LIVER DISEASE 05/09/2008   GERD 04/04/2007   no per pt   Headache    Migraines a few times a year 1993   Heart murmur    Hypercholesteremia    HYPERGLYCEMIA 04/04/2007   HYPERTENSION 11/23/2006   Migraines    NASH (nonalcoholic steatohepatitis)    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 His smoking use included cigars. He has never used smokeless tobacco. He reports that he does not currently use alcohol. He reports that he does not use drugs. family history includes Emphysema in his father; Healthy in his daughter; Heart attack in his mother; Seizures in his grandchild and mother. Allergies  Allergen Reactions   Gabapentin Nausea And Vomiting   Current Outpatient Medications on  File Prior to Visit  Medication Sig Dispense Refill   aspirin EC 81 MG tablet Take 81 mg by mouth every morning.     Cholecalciferol (VITAMIN D-3) 25 MCG (1000 UT) CAPS Take 1 capsule by mouth daily.     HYDROmorphone (DILAUDID) 2 MG tablet Take 1 tablet (2 mg total) by mouth every 6 (six) hours as needed for severe pain. 30 tablet 0   losartan-hydrochlorothiazide (HYZAAR) 50-12.5 MG tablet Take 1 tablet by mouth daily. 90 tablet 3   lovastatin (MEVACOR) 40 MG tablet TAKE 1 TABLET BY MOUTH EVERY DAY 90 tablet 3   MAGNESIUM PO Take by mouth.     NURTEC 75 MG TBDP TAKE 1 TABLET BY MOUTH AS NEEDED FOR MIGRAINE FIRST 15 MIN 16 tablet 5   ondansetron (ZOFRAN ODT) 4 MG disintegrating tablet  Take 1 tablet (4 mg total) by mouth every 8 (eight) hours as needed for nausea or vomiting. 20 tablet 1   pioglitazone (ACTOS) 15 MG tablet Take 1 tablet (15 mg total) by mouth daily. 90 tablet 3   POTASSIUM PO Take by mouth.     sildenafil (VIAGRA) 100 MG tablet Take 0.5-1 tablets (50-100 mg total) by mouth daily as needed for erectile dysfunction. 5 tablet 11   VITAMIN D PO Take by mouth.     zolmitriptan (ZOMIG-ZMT) 5 MG disintegrating tablet DISSOLVE 1 TABLET ON TONGUE AS NEEDED FOR MIGRAINE 10 tablet 5   eletriptan (RELPAX) 40 MG tablet Take by mouth.     No current facility-administered medications on file prior to visit.        ROS:  All others reviewed and negative.  Objective        PE:  BP 136/80 (BP Location: Right Arm, Patient Position: Sitting, Cuff Size: Large)   Pulse 62   Temp 98.1 F (36.7 C) (Oral)   Ht '5\' 8"'$  (1.727 m)   Wt 255 lb (115.7 kg)   SpO2 92%   BMI 38.77 kg/m                 Constitutional: Pt appears in NAD               HENT: Head: NCAT.                Right Ear: External ear normal.                 Left Ear: External ear normal.                Eyes: . Pupils are equal, round, and reactive to light. Conjunctivae and EOM are normal               Nose: without d/c or deformity               Neck: Neck supple. Gross normal ROM               Cardiovascular: Normal rate and regular rhythm.                 Pulmonary/Chest: Effort normal and breath sounds without rales or wheezing.                Abd:  Soft, NT, ND, + BS, no organomegaly               Neurological: Pt is alert. At baseline orientation, motor grossly intact               Skin: Skin is  warm. No rashes, no other new lesions, LE edema - none               Psychiatric: Pt behavior is normal without agitation   Micro: none  Cardiac tracings I have personally interpreted today:  none  Pertinent Radiological findings (summarize): none   Lab Results  Component Value Date   WBC 9.7  11/25/2021   HGB 16.8 11/25/2021   HCT 48.7 11/25/2021   PLT 226.0 11/25/2021   GLUCOSE 110 (H) 11/25/2021   CHOL 136 11/25/2021   TRIG 152.0 (H) 11/25/2021   HDL 41.60 11/25/2021   LDLDIRECT 58.0 12/08/2020   LDLCALC 64 11/25/2021   ALT 26 11/25/2021   AST 19 11/25/2021   NA 139 11/25/2021   K 4.3 11/25/2021   CL 98 11/25/2021   CREATININE 1.75 (H) 11/25/2021   BUN 19 11/25/2021   CO2 29 11/25/2021   TSH 3.87 11/25/2021   PSA 0.47 11/25/2021   HGBA1C 5.3 11/25/2021   Assessment/Plan:  BAXTER GONZALEZ is a 66 y.o. White or Caucasian [1] male with  has a past medical history of ALLERGIC RHINITIS (11/23/2006), Back pain, ERECTILE DYSFUNCTION, ORGANIC (04/04/2007), FATTY LIVER DISEASE (05/09/2008), GERD (04/04/2007), Headache, Heart murmur, Hypercholesteremia, HYPERGLYCEMIA (04/04/2007), HYPERTENSION (11/23/2006), Migraines, NASH (nonalcoholic steatohepatitis), Other testicular hypofunction (05/19/2007), PUD (peptic ulcer disease), and Sleep apnea.  Vitamin D deficiency Last vitamin D Lab Results  Component Value Date   VD25OH 30.83 11/25/2021   Low, to increase oral replacement to 2000 u qd   Encounter for well adult exam with abnormal findings Age and sex appropriate education and counseling updated with regular exercise and diet Referrals for preventative services - none needed Immunizations addressed - declines covid booster, shingrix for now Smoking counseling  - none needed Evidence for depression or other mood disorder - none significant Most recent labs reviewed. I have personally reviewed and have noted: 1) the patient's medical and social history 2) The patient's current medications and supplements 3) The patient's height, weight, and BMI have been recorded in the chart   Diabetes [facility]) Lab Results  Component Value Date   HGBA1C 5.3 11/25/2021   Stable, pt to continue current medical treatment - diet, wt control,excericse and change the actos to mounjaro if  ok with insurance for wt loss and sugar  Essential hypertension BP Readings from Last 3 Encounters:  12/21/21 136/80  12/07/21 (!) 137/93  11/25/21 136/80   Stable, pt to continue medical treatment hyzaar 50-12.5 qd   Hyperlipidemia Lab Results  Component Value Date   LDLCALC 64 11/25/2021   Stable, pt to continue current statin lovastatin 40 qd., for cardiac CT score  Stage 3b chronic kidney disease (CKD) ([place]) Lab Results  Component Value Date   CREATININE 1.75 (H) 11/25/2021   Stable overall, cont to avoid nephrotoxins  Followup: No follow-ups on file.  Cathlean Cower, MD 12/21/2021 9:20 PM [facility]

## 2021-12-21 NOTE — Assessment & Plan Note (Addendum)
Lab Results  Component Value Date   LDLCALC 64 11/25/2021   Stable, pt to continue current statin lovastatin 40 qd., for cardiac CT score

## 2021-12-21 NOTE — Assessment & Plan Note (Addendum)
Last vitamin D Lab Results  Component Value Date   VD25OH 30.83 11/25/2021   Low, to increase oral replacement to 2000 u qd

## 2021-12-21 NOTE — Patient Instructions (Signed)
You had the Prevnar 20 and the Flu shots today  Ok to stop the pioglitazone if you are able to start the [facility] to increase the Vitamin D to 2000 units per day.  You will be contacted regarding the referral for: Cardiac CT score  Please continue all other medications as before, and refills have been done if requested.  Please have the pharmacy call with any other refills you may need.  Please continue your efforts at being more active, low cholesterol diet, and weight control.  You are otherwise up to date with prevention measures today.  Please keep your appointments with your specialists as you may have planned - Nephrology and Urology soon  Please make an Appointment to return in 6 months, or sooner if needed, also with Lab Appointment for testing done 3-5 days before at the [facility] (so this is for TWO appointments - please see the scheduling desk as you leave)

## 2021-12-21 NOTE — Assessment & Plan Note (Signed)
Age and sex appropriate education and counseling updated with regular exercise and diet Referrals for preventative services - none needed Immunizations addressed - declines covid booster, shingrix for now Smoking counseling  - none needed Evidence for depression or other mood disorder - none significant Most recent labs reviewed. I have personally reviewed and have noted: 1) the patient's medical and social history 2) The patient's current medications and supplements 3) The patient's height, weight, and BMI have been recorded in the chart

## 2021-12-21 NOTE — Assessment & Plan Note (Signed)
BP Readings from Last 3 Encounters:  12/21/21 136/80  12/07/21 (!) 137/93  11/25/21 136/80   Stable, pt to continue medical treatment hyzaar 50-12.5 qd

## 2021-12-30 ENCOUNTER — Other Ambulatory Visit: Payer: Self-pay | Admitting: Urology

## 2022-01-04 ENCOUNTER — Encounter (HOSPITAL_BASED_OUTPATIENT_CLINIC_OR_DEPARTMENT_OTHER): Payer: Self-pay | Admitting: Urology

## 2022-01-04 NOTE — Progress Notes (Signed)
01/04/2022 12:08 PM Spoke w/ via phone for pre-op interview---PATIENT Lab needs dos---- EKG & CHEM 8 Lab results------CBC/BMET/A1C 11/25/21 IN EPIC COVID test -----patient states asymptomatic no test needed Arrive at -------0615 NPO after MN NO Solid Food.  Clear liquids from MN until---0415 Med rec completed Medications to take morning of surgery -----NONE, HOLD ASA x 1 week per Dr. Lovena Neighbours, HOLD Hyzaar and Actos per protocol DOS Diabetic medication -----Actos HOLD DOS, (takes for fatty liver, denies dx of DM2) Patient instructed no nail polish to be worn day of surgery Patient instructed to bring photo id and insurance card day of surgery Patient aware to have Driver (ride ) / caregiver    for 24 hours after surgery -Grandson, ANTONIO or wife, Millie  Patient Special Instructions -----BRING CPAP DOS Pre-Op special Istructions -----n/a  Patient verbalized understanding of instructions that were given at this phone interview. Patient denies shortness of breath, chest pain, fever, cough at this phone interview.  Evertt Chouinard, Arville Lime

## 2022-01-13 ENCOUNTER — Ambulatory Visit (HOSPITAL_BASED_OUTPATIENT_CLINIC_OR_DEPARTMENT_OTHER): Admission: RE | Admit: 2022-01-13 | Payer: BC Managed Care – PPO | Source: Ambulatory Visit | Admitting: Urology

## 2022-01-13 DIAGNOSIS — E1165 Type 2 diabetes mellitus with hyperglycemia: Secondary | ICD-10-CM

## 2022-01-13 DIAGNOSIS — N529 Male erectile dysfunction, unspecified: Secondary | ICD-10-CM

## 2022-01-13 DIAGNOSIS — I10 Essential (primary) hypertension: Secondary | ICD-10-CM

## 2022-01-13 HISTORY — DX: Personal history of urinary calculi: Z87.442

## 2022-01-13 SURGERY — CYSTOSCOPY/URETEROSCOPY/HOLMIUM LASER/STENT PLACEMENT
Anesthesia: General | Laterality: Right

## 2022-02-08 ENCOUNTER — Encounter: Payer: Self-pay | Admitting: Internal Medicine

## 2022-02-08 ENCOUNTER — Ambulatory Visit: Payer: BC Managed Care – PPO | Admitting: Internal Medicine

## 2022-02-08 VITALS — BP 132/70 | HR 66 | Temp 97.8°F | Ht 68.0 in | Wt 251.4 lb

## 2022-02-08 DIAGNOSIS — K5900 Constipation, unspecified: Secondary | ICD-10-CM | POA: Diagnosis not present

## 2022-02-08 DIAGNOSIS — K644 Residual hemorrhoidal skin tags: Secondary | ICD-10-CM

## 2022-02-08 DIAGNOSIS — E1165 Type 2 diabetes mellitus with hyperglycemia: Secondary | ICD-10-CM

## 2022-02-08 DIAGNOSIS — I10 Essential (primary) hypertension: Secondary | ICD-10-CM

## 2022-02-08 MED ORDER — HYDROCORTISONE (PERIANAL) 2.5 % EX CREA: 1.0000 | TOPICAL_CREAM | Freq: Two times a day (BID) | CUTANEOUS | 0 refills | Status: DC

## 2022-02-08 MED ORDER — POLYETHYLENE GLYCOL 3350 17 GM/SCOOP PO POWD: 17.0000 g | Freq: Every day | ORAL | 1 refills | Status: DC

## 2022-02-08 NOTE — Assessment & Plan Note (Signed)
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

## 2022-02-08 NOTE — Assessment & Plan Note (Signed)
BP Readings from Last 3 Encounters:  02/08/22 132/70  12/21/21 136/80  12/07/21 (!) 137/93   Stable, pt to continue medical treatment hyzaar 50 12.,5 qd

## 2022-02-08 NOTE — Progress Notes (Signed)
Patient ID: Ruben West, male   DOB: 1955-10-18, 66 y.o.   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 male here with c/o 6 days onset anal rectal pain severe to start, occurred with 2 days significant constipation prior and straining at stool.  Has hx of same years ago with heavy lifting job one time.  Also has had intermittent mild BRBPR that really got him upset.  Last colonoscopy sept 2022 with several polyps, no malignancy Pt denies chest pain, increased sob or doe, wheezing, orthopnea, PND, increased LE swelling, palpitations, dizziness or syncope.   Pt denies polydipsia, polyuria, or new focal neuro s/s.  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 He has never used smokeless tobacco. He reports that he does not currently use alcohol after a past usage of about 1.0 standard drink of alcohol per week. He reports that he does not use drugs. family history includes Emphysema in his father; Healthy in his daughter; Heart attack in his mother; Seizures in his grandchild and mother. Allergies  Allergen Reactions   Gabapentin Nausea And Vomiting   Current Outpatient Medications on File Prior to Visit  Medication Sig Dispense Refill   aspirin EC 81 MG tablet Take 81 mg by mouth every morning.     Cholecalciferol (VITAMIN D-3) 25 MCG (1000 UT) CAPS Take 1 capsule by mouth daily.     eletriptan (RELPAX) 40 MG tablet Take by mouth.     HYDROmorphone (DILAUDID) 2 MG tablet Take 1 tablet (2 mg total) by mouth every 6 (six) hours as needed for severe pain. 30 tablet 0   losartan-hydrochlorothiazide (HYZAAR) 50-12.5 MG tablet Take 1 tablet by mouth daily. 90 tablet 3   lovastatin (MEVACOR) 40 MG tablet TAKE 1 TABLET BY MOUTH EVERY DAY 90 tablet 3   MAGNESIUM PO Take by mouth.  NURTEC 75 MG TBDP TAKE 1 TABLET BY MOUTH AS NEEDED FOR MIGRAINE FIRST 15 MIN 16 tablet 5   ondansetron (ZOFRAN ODT) 4 MG disintegrating tablet Take 1 tablet (4 mg total) by mouth every 8 (eight) hours as needed for nausea or vomiting. 20 tablet 1   pioglitazone (ACTOS) 15 MG tablet Take 1 tablet (15 mg total) by mouth daily. 90 tablet 3   POTASSIUM PO Take by mouth.     sildenafil (VIAGRA) 100 MG tablet Take 0.5-1 tablets (50-100 mg total) by mouth daily as needed for erectile dysfunction. 5 tablet 11   VITAMIN D PO Take by mouth.     zolmitriptan (ZOMIG-ZMT) 5 MG disintegrating tablet DISSOLVE 1 TABLET ON TONGUE AS NEEDED FOR MIGRAINE 10 tablet 5   No current facility-administered medications on file prior to visit.        ROS:  All others reviewed and  negative.  Objective        PE:  BP 132/70   Pulse 66   Temp 97.8 F (36.6 C) (Oral)   Ht '5\' 8"'$  (1.727 m)   Wt 251 lb 6 oz (114 kg)   SpO2 96%   BMI 38.22 kg/m                 Constitutional: Pt appears in NAD               HENT: Head: NCAT.                Right Ear: External ear normal.                 Left Ear: External ear normal.                Eyes: . Pupils are equal, round, and reactive to light. Conjunctivae and EOM are normal               Nose: without d/c or deformity               Neck: Neck supple. Gross normal ROM               Cardiovascular: Normal rate and regular rhythm.                 Pulmonary/Chest: Effort normal and breath sounds without rales or wheezing.                Abd:  Soft, NT, ND, + BS, no organomegaly               Neurological: Pt is alert. At baseline orientation, motor grossly intact;    Anus with mod sized ext hemorrhoid , mild tender only, no active bleeding               Skin: Skin is warm. No rashes, LE edema - none               Psychiatric: Pt behavior is normal without agitation   Micro: none  Cardiac tracings I have personally interpreted today:  none  Pertinent Radiological findings (summarize): none   Lab Results  Component Value Date   WBC 9.7 11/25/2021   HGB 16.8 11/25/2021   HCT 48.7 11/25/2021   PLT 226.0 11/25/2021   GLUCOSE 110 (H) 11/25/2021   CHOL 136 11/25/2021   TRIG 152.0 (H) 11/25/2021   HDL 41.60 11/25/2021   LDLDIRECT 58.0 12/08/2020   LDLCALC 64 11/25/2021   ALT 26 11/25/2021   AST 19 11/25/2021  NA 139 11/25/2021   K 4.3 11/25/2021   CL 98 11/25/2021   CREATININE 1.75 (H) 11/25/2021   BUN 19 11/25/2021   CO2 29 11/25/2021   TSH 3.87 11/25/2021   PSA 0.47 11/25/2021   HGBA1C 5.3 11/25/2021   Assessment/Plan:  Ruben West is a 66 y.o. White or Caucasian [1] male with  has a past medical history of ALLERGIC RHINITIS (11/23/2006), Back pain, ERECTILE DYSFUNCTION, ORGANIC (04/04/2007), FATTY  LIVER DISEASE (05/09/2008), GERD (04/04/2007), Headache, Heart murmur, History of kidney stones, Hypercholesteremia, HYPERGLYCEMIA (04/04/2007), HYPERTENSION (11/23/2006), Migraines, NASH (nonalcoholic steatohepatitis), Other testicular hypofunction (05/19/2007), PUD (peptic ulcer disease), and Sleep apnea.  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  Cathlean Cower, MD 02/08/2022 9:13 AM [facility]

## 2022-02-08 NOTE — Patient Instructions (Addendum)
Please take all new medication as prescribed - the anusol HC cream  Ok to also take miralax OTC 17 mg per day for constipation  You can also take OTC Colace 100 mg up to twice per day for hard stools if needed  Please continue all other medications as before, and refills have been done if requested.  Please have the pharmacy call with any other refills you may need.  Please keep your appointments with your specialists as you may have planned  Please make an Appointment to return in Jun 21 2022, or sooner if needed

## 2022-02-08 NOTE — Assessment & Plan Note (Signed)
Now 6 days post onset with pain improved, no further bleeding, for anusol HC cr bid

## 2022-02-08 NOTE — Assessment & Plan Note (Signed)
Lab Results  Component Value Date   HGBA1C 5.3 11/25/2021   Stable, pt to continue current medical treatment actos 15 mg qd

## 2022-03-04 LAB — BASIC METABOLIC PANEL
CO2: 21 (ref 13–22)
Chloride: 107 (ref 99–108)
Creatinine: 1 (ref 0.6–1.3)
Glucose: 97
Potassium: 4.1 mEq/L (ref 3.5–5.1)

## 2022-03-04 LAB — CBC: RBC: 5.02 (ref 3.87–5.11)

## 2022-03-04 LAB — PROTEIN / CREATININE RATIO, URINE
Albumin, U: 3
Creatinine, Urine: 85.2

## 2022-03-04 LAB — CBC AND DIFFERENTIAL
HCT: 44 (ref 41–53)
Hemoglobin: 15.2 (ref 13.5–17.5)
WBC: 7.1

## 2022-03-04 LAB — COMPREHENSIVE METABOLIC PANEL: Albumin: 3 — AB (ref 3.5–5.0)

## 2022-03-04 LAB — MICROALBUMIN / CREATININE URINE RATIO: Microalb Creat Ratio: 4

## 2022-04-06 ENCOUNTER — Encounter (HOSPITAL_COMMUNITY): Payer: Self-pay

## 2022-04-06 ENCOUNTER — Ambulatory Visit: Payer: BC Managed Care – PPO | Admitting: Internal Medicine

## 2022-04-06 ENCOUNTER — Encounter: Payer: Self-pay | Admitting: Internal Medicine

## 2022-04-06 ENCOUNTER — Emergency Department (HOSPITAL_COMMUNITY): Payer: BC Managed Care – PPO

## 2022-04-06 ENCOUNTER — Other Ambulatory Visit: Payer: Self-pay

## 2022-04-06 ENCOUNTER — Emergency Department (HOSPITAL_COMMUNITY)
Admission: EM | Admit: 2022-04-06 | Discharge: 2022-04-06 | Disposition: A | Discharge status: Home / Self Care | Payer: BC Managed Care – PPO | Attending: Emergency Medicine | Admitting: Emergency Medicine

## 2022-04-06 VITALS — BP 120/64 | HR 63 | Temp 97.7°F | Ht 68.0 in | Wt 250.0 lb

## 2022-04-06 DIAGNOSIS — R110 Nausea: Secondary | ICD-10-CM | POA: Diagnosis not present

## 2022-04-06 DIAGNOSIS — R1084 Generalized abdominal pain: Secondary | ICD-10-CM | POA: Diagnosis not present

## 2022-04-06 DIAGNOSIS — I10 Essential (primary) hypertension: Secondary | ICD-10-CM | POA: Insufficient documentation

## 2022-04-06 DIAGNOSIS — R1031 Right lower quadrant pain: Secondary | ICD-10-CM | POA: Insufficient documentation

## 2022-04-06 DIAGNOSIS — R109 Unspecified abdominal pain: Secondary | ICD-10-CM

## 2022-04-06 DIAGNOSIS — R509 Fever, unspecified: Secondary | ICD-10-CM | POA: Diagnosis present

## 2022-04-06 LAB — URINALYSIS, ROUTINE W REFLEX MICROSCOPIC
Bilirubin Urine: NEGATIVE
Glucose, UA: NEGATIVE mg/dL
Hgb urine dipstick: NEGATIVE
Ketones, ur: NEGATIVE mg/dL
Leukocytes,Ua: NEGATIVE
Nitrite: NEGATIVE
Protein, ur: NEGATIVE mg/dL
Specific Gravity, Urine: 1.02 (ref 1.005–1.030)
pH: 5 (ref 5.0–8.0)

## 2022-04-06 LAB — LIPASE, BLOOD: Lipase: 46 U/L (ref 11–51)

## 2022-04-06 LAB — COMPREHENSIVE METABOLIC PANEL
ALT: 36 U/L (ref 0–44)
AST: 29 U/L (ref 15–41)
Albumin: 3.6 g/dL (ref 3.5–5.0)
Alkaline Phosphatase: 60 U/L (ref 38–126)
Anion gap: 9 (ref 5–15)
BUN: 14 mg/dL (ref 8–23)
CO2: 26 mmol/L (ref 22–32)
Calcium: 9.2 mg/dL (ref 8.9–10.3)
Chloride: 104 mmol/L (ref 98–111)
Creatinine, Ser: 1.23 mg/dL (ref 0.61–1.24)
GFR, Estimated: >60 mL/min (ref >60)
Glucose, Bld: 104 mg/dL — ABNORMAL HIGH (ref 70–99)
Potassium: 4.2 mmol/L (ref 3.5–5.1)
Sodium: 139 mmol/L (ref 135–145)
Total Bilirubin: 1 mg/dL (ref 0.3–1.2)
Total Protein: 6.7 g/dL (ref 6.5–8.1)

## 2022-04-06 LAB — CBC
HCT: 47 % (ref 39.0–52.0)
Hemoglobin: 16 g/dL (ref 13.0–17.0)
MCH: 29.9 pg (ref 26.0–34.0)
MCHC: 34 g/dL (ref 30.0–36.0)
MCV: 87.7 fL (ref 80.0–100.0)
Platelets: 215 10*3/uL (ref 150–400)
RBC: 5.36 MIL/uL (ref 4.22–5.81)
RDW: 13.5 % (ref 11.5–15.5)
WBC: 7.8 10*3/uL (ref 4.0–10.5)
nRBC: 0 % (ref 0.0–0.2)

## 2022-04-06 MED ORDER — KETOROLAC TROMETHAMINE 60 MG/2ML IM SOLN
60.0000 mg | Freq: Once | INTRAMUSCULAR | Status: AC
  Administered 2022-04-06: 60 mg via INTRAMUSCULAR

## 2022-04-06 MED ORDER — ONDANSETRON 4 MG PO TBDP
4.0000 mg | ORAL_TABLET | Freq: Once | ORAL | Status: AC
  Administered 2022-04-06: 4 mg via ORAL
  Filled 2022-04-06: qty 1

## 2022-04-06 MED ORDER — ACETAMINOPHEN 500 MG PO TABS
1000.0000 mg | ORAL_TABLET | Freq: Once | ORAL | Status: AC
  Administered 2022-04-06: 1000 mg via ORAL
  Filled 2022-04-06: qty 2

## 2022-04-06 MED ORDER — ONDANSETRON HCL 4 MG/2ML IJ SOLN
4.0000 mg | Freq: Once | INTRAMUSCULAR | Status: AC
  Administered 2022-04-06: 4 mg via INTRAMUSCULAR

## 2022-04-06 MED ORDER — FAMOTIDINE 20 MG PO TABS
20.0000 mg | ORAL_TABLET | Freq: Once | ORAL | Status: AC
  Administered 2022-04-06: 20 mg via ORAL
  Filled 2022-04-06: qty 1

## 2022-04-06 NOTE — ED Provider Notes (Signed)
[place] DEPT Provider Note   CSN: [id number] Arrival date & time: 04/06/22  0941     History Chief Complaint  Patient presents with   Flank Pain    HPI Ruben West is a 67 y.o. male presenting for chief complaint of right-sided abdominal pain radiating to his right flank.  He also endorses dysuria, fevers, nausea without vomiting.  He endorses diarrhea frequently.  He endorses a history of hiatal hernia and similar symptoms in the past. He is otherwise ambulatory tolerating p.o. intake.  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 Has a history of nephrolithiasis and similar symptoms in the past.  Denies history of urinary tract infections.  States he has had urinary frequency over the past 48 hours as well.  Seen at urgent care and PCP and told to come to the ER for further evaluation..   Patient's recorded medical, surgical, social, medication list and allergies were reviewed in the Snapshot window as part of the initial history.   Review of Systems   Review of Systems  Constitutional:  Positive for chills and fever. Negative for fatigue.  HENT:  Negative for ear pain and sore throat.   Eyes:  Negative for pain and visual disturbance.  Respiratory:  Negative for cough and shortness of breath.   Cardiovascular:  Negative for chest pain and palpitations.  Gastrointestinal:  Negative for abdominal pain and vomiting.  Genitourinary:  Positive for dysuria, flank pain and frequency. Negative for hematuria.  Musculoskeletal:  Negative for arthralgias and back pain.  Skin:  Negative for color change and rash.  Neurological:  Negative for seizures and syncope.  All other systems reviewed and are negative.   Physical Exam Updated Vital Signs BP 132/80   Pulse 66   Temp 98 F (36.7 C)   Resp 16   SpO2 99%  Physical Exam Vitals and nursing note reviewed.   Constitutional:      General: He is not in acute distress.    Appearance: He is well-developed.  HENT:     Head: Normocephalic and atraumatic.  Eyes:     Conjunctiva/sclera: Conjunctivae normal.  Cardiovascular:     Rate and Rhythm: Normal rate and regular rhythm.     Heart sounds: No murmur heard. Pulmonary:     Effort: Pulmonary effort is normal. No respiratory distress.     Breath sounds: Normal breath sounds.  Abdominal:     Palpations: Abdomen is soft.     Tenderness: There is abdominal tenderness (Diffuse. Does not localize). There is no guarding.  Musculoskeletal:        General: No swelling.     Cervical back: Neck supple.  Skin:    General: Skin is warm and dry.     Capillary Refill: Capillary refill takes less than 2 seconds.  Neurological:     Mental Status: He is alert.  Psychiatric:        Mood and Affect: Mood normal.      ED Course/ Medical Decision Making/ A&P Clinical Course as of 04/06/22 2317  Tue Apr 06, 2022  1532 HLD HTN  [CC]    Clinical Course User Index [CC] Tretha Sciara, MD    Procedures Procedures   Medications Ordered in ED Medications  famotidine (PEPCID) tablet 20 mg (20 mg Oral Given 04/06/22 1552)  acetaminophen (TYLENOL) tablet 1,000 mg (1,000 mg Oral Given 04/06/22 1552)  ondansetron (ZOFRAN-ODT)  disintegrating tablet 4 mg (4 mg Oral Given 04/06/22 1553)   Medical Decision Making:   Ruben West is a 67 y.o. male who presented to the ED today with abdominal pain, detailed above.    Patient's presentation is complicated by their history of multiple comorbid medical conditions, advanced age.  Patient placed on continuous vitals and telemetry monitoring while in ED which was reviewed periodically.  Complete initial physical exam performed, notably the patient  was hemodynamically stable in no acute distress.  He has diffuse abdominal tenderness without guarding..     Reviewed and confirmed nursing documentation for past medical  history, family history, social history.    Initial Assessment:   With the patient's presentation of abdominal pain, most likely diagnosis is urinary tract infection vs nonspecific gastritis. Other diagnoses were considered including (but not limited to) gastroenteritis, colitis, small bowel obstruction, appendicitis, cholecystitis, pancreatitis, nephrolithiasis,  pyleonephritis, testicular torsion. These are considered less likely due to history of present illness and physical exam findings.   This is most consistent with an acute life/limb threatening illness complicated by underlying chronic conditions.   Initial Plan:  CBC/CMP to evaluate for underlying infectious/metabolic etiology for patient's abdominal pain  Lipase to evaluate for pancreatitis  Triage ordered a CT Ab/pelvis without contrast due to favored nephrolithiasis over GI etiology for patient's abdominal pain.  It ultimately resulted normal.  I had a shared medical decision making conversation with the patient regarding further care and management.  Offered contrasted study for further diagnostic evaluation however after shared medical decision making, patient does not want undergo repeat CT today given visualized appendix that appeared within normal limits. Will proceed with right upper quadrant ultrasound for further evaluation of biliary etiology of patient's symptoms. Urinalysis and repeat physical assessment to evaluate for UTI/Pyelonpehritis  Empiric management of symptoms with escalating pain control and antiemetics as needed.   Initial Study Results:   Laboratory  All laboratory results reviewed without evidence of clinically relevant pathology.     Radiology All images reviewed independently. Agree with radiology report at this time.   US Abdomen Limited RUQ (LIVER/GB)  Result Date: 04/06/2022 CLINICAL DATA:  Upper quadrant pain EXAM: ULTRASOUND ABDOMEN LIMITED RIGHT UPPER QUADRANT COMPARISON:  None Available. FINDINGS:  Gallbladder: No gallstones or wall thickening visualized. No sonographic Murphy sign noted by sonographer. Common bile duct: Diameter: 3.5 mm Liver: Diffuse increase echogenicity. No focal mass. Portal vein is patent on color Doppler imaging with normal direction of blood flow towards the liver. Other: None. IMPRESSION: 1. Diffuse increase echogenicity in the liver is nonspecific but often due to hepatic steatosis. 2. The gallbladder and common bile duct are normal. Electronically Signed   By: Dorise Bullion III M.D.   On: 04/06/2022 17:27   CT Renal Stone Study  Result Date: 04/06/2022 CLINICAL DATA:  Right-sided abdominal pain, renal stone suspected. EXAM: CT ABDOMEN AND PELVIS WITHOUT CONTRAST TECHNIQUE: Multidetector CT imaging of the abdomen and pelvis was performed following the standard protocol without IV contrast. RADIATION DOSE REDUCTION: This exam was performed according to the departmental dose-optimization program which includes automated exposure control, adjustment of the mA and/or kV according to patient size and/or use of iterative reconstruction technique. COMPARISON:  CT November 19, 2021 FINDINGS: Lower chest: No acute abnormality. Hepatobiliary: Unremarkable noncontrast enhanced appearance of the hepatic parenchyma. Gallbladder is unremarkable. No biliary ductal dilation. Pancreas: No pancreatic ductal dilation or evidence of acute inflammation. Spleen: No splenomegaly. Adrenals/Urinary Tract: Adrenal glands are normal. No hydronephrosis. No  renal, ureteral or bladder calculi. Urinary bladder is unremarkable for degree of distension. Stomach/Bowel: Stomach is minimally distended limiting evaluation. Duodenal diverticula. No pathologic dilation of small or large bowel. No evidence of acute bowel inflammation. The appendix and terminal ileum appear normal. Vascular/Lymphatic: Normal caliber abdominal aorta. Aortic atherosclerosis. No pathologically enlarged abdominal or pelvic lymph nodes.  Smooth IVC contours. Reproductive: Prostate is unremarkable. Other: No significant abdominopelvic free fluid. Fat containing paraumbilical hernia. Musculoskeletal: No acute osseous abnormality. Multilevel degenerative change of the spine. 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 Possible avascular necrosis of the right femoral head. IMPRESSION: 1. No acute abnormality in the abdomen or pelvis. Specifically, no evidence of obstructive uropathy. 2. Fat containing paraumbilical hernia. 3.  Aortic Atherosclerosis (ICD10-I70.0). Electronically Signed   By: Dahlia Bailiff M.D.   On: 04/06/2022 12:27      Final Reassessment and Plan:   Patient observed in emergency department for total of 9 hours.  Flank pain, abdominal pain all resolved with the above therapies.  Had a prolonged conversation.  Ultimately, it appears that his clinical syndrome is likely more benign etiology given no acute pathology identified today. However clinical overlap with conditions such as appendicitis remains on the differential.  Patient will plan to follow-up with PCP in a.m. or return if his symptoms come back.  Otherwise patient stable for outpatient care and management with strict return precautions reinforced.   Disposition:  I have considered need for hospitalization, however, considering all of the above, I believe this patient is stable for discharge at this time.  Patient/family educated about specific return precautions for given chief complaint and symptoms.  Patient/family educated about follow-up with PCP .     Patient/family expressed understanding of return precautions and need for follow-up. Patient spoken to regarding all imaging and laboratory results and appropriate follow up for these results. All education provided in verbal form with additional information in written form. Time was allowed for answering of patient questions. Patient discharged.    Emergency Department Medication Summary:    Medications  famotidine (PEPCID) tablet 20 mg (20 mg Oral Given 04/06/22 1552)  acetaminophen (TYLENOL) tablet 1,000 mg (1,000 mg Oral Given 04/06/22 1552)  ondansetron (ZOFRAN-ODT) disintegrating tablet 4 mg (4 mg Oral Given 04/06/22 1553)          Clinical Impression:  1. Flank pain   2. Generalized abdominal pain      Discharge   Final Clinical Impression(s) / ED Diagnoses Final diagnoses:  Flank pain  Generalized abdominal pain    Rx / DC Orders ED Discharge Orders     None         Tretha Sciara, MD 04/06/22 2318

## 2022-04-06 NOTE — ED Triage Notes (Addendum)
Patient sent from urgent care due to his right upper side pain that radiates to his right back and right upper abdomen. History of kidney stones. Nauseous. Burning with urination.

## 2022-04-06 NOTE — ED Notes (Signed)
Sent from PCP due to sharp upper abd pain since 11pm.

## 2022-04-06 NOTE — Addendum Note (Signed)
Addended by: Basil Dess on: 04/06/2022 09:35 AM   Modules accepted: Orders

## 2022-04-06 NOTE — Assessment & Plan Note (Signed)
With dry heaves Zofran 4 mg IM Pt was sent to ER

## 2022-04-06 NOTE — Discharge Instructions (Addendum)
You were seen today for abdominal pain. We did not identify any emergent cause for your symptoms. Your evaluation is most consistent with nonspecific cause.   Plan and next steps:   The following may be helpful in managing your symptoms:   Pain- Lidocaine Patches  Apply to affected area for up to 12 hours at a time.   Pain/Fever- Adult Tylenol dosing:  650 mg orally every 4 to 6 hours as needed, MAX: 3250 mg/24 hours   (Extra-strength) 1000 mg orally every 6 hours as needed; MAX: 3000 mg/24 hours   Do not use if you have liver disease. Read the label on the bottle.   Pain/Fever- Adult Ibuprofen Dosing  200 to 400 mg orally every 4 to 6 hours as needed; MAX 1200 mg/day; do not take longer than 10 days   Do not use if you have kidney disease. Read the label on the bottle   Gastritis/Heartburn- Omeprazole Take '20mg'$  Daily for 14 days  Nausea- You have been prescribed Zofran Take '4mg'$  by mouth as needed every 6 hours approximately 30 minutes before eating.  Findings:  You may see all of your lab and imaging results utilizing our online portal! Look in this document or ask a team member for your mychart* access information. The most notable results have additionally been verbally communicated with you and your bedside family.    Follow-up Plan:   Follow up with the patient's normal primary care provider for monitoring of this condition within 48 hours.   Signs/Symptoms that would warrant return to the ED:  Please return to the ED if you experience worsening of symptoms or any abrupt changes in your health. Standard of care precautions for your chief complaint have already been verbally communicated with you. Always be on alert for fevers, chills, shortness of breath, chest pains, or sudden changes that warrant immediate evaluation.    Thank you for allowing [name] to be a part of you and your families' care.   Tretha Sciara MD

## 2022-04-06 NOTE — ED Provider Triage Note (Signed)
Emergency Medicine Provider Triage Evaluation Note  Ruben West , a 67 y.o. male  was evaluated in triage.  Pt complains of right flank pain, hx of stones. Seen at [facility] in [place] yesterday, did a urine that looked ok. Pain worse in RUQ. Went to PCP today who wasn't sure if gallbladder or appendix pain.  Nausea, no vomiting. Diarrhea yesterday. Cold seat yesterday, no fevers. ? Dysuria, polyuria (but drinking more water).  Review of Systems  Positive: As above Negative: As above  Physical Exam  BP (!) 171/110 (BP Location: Left Arm)   Pulse 71   Temp 98.2 F (36.8 C) (Oral)   Resp 19   SpO2 98%  Gen:   Awake, no distress   Resp:  Normal effort  MSK:   Moves extremities without difficulty  Other:    Medical Decision Making  Medically screening exam initiated at 10:02 AM.  Appropriate orders placed.  Ruben West was informed that the remainder of the evaluation will be completed by another provider, this initial triage assessment does not replace that evaluation, and the importance of remaining in the ED until their evaluation is complete.     Tacy Learn, PA-C 04/06/22 1004

## 2022-04-06 NOTE — Patient Instructions (Signed)
[facility] Emergency room  Address: [address], [place], [place] [address] Hours: Open 24 hours Phone: [phone]   [place] [facility] Address: [address] [place], [place] [address] Hours: Open 24 hours Phone: [phone]

## 2022-04-06 NOTE — Assessment & Plan Note (Addendum)
Pt was sent to [facility] ER R/o acute abdomen Toradol 60 mg IM

## 2022-04-06 NOTE — Progress Notes (Signed)
Subjective:  Patient ID: Ruben West, male    DOB: Feb 09, 1956  Age: 67 y.o. MRN: [id number]  CC: Abdominal Pain ( Sharp pain On right lower side and under ribs , and has been feeling nausea and had diarreha , the pain started Monday morning also had cold sweats )   HPI Ruben West presents for acute sharp RUQ pain since 11 am yesterday. He thought he was passing a kidney stone, he was nauseated and diarrhea in am. He went to UC - UA was nl. No other labs. Pt slept last night. Chills x1. Pain is 7/10 now...  Outpatient Medications Prior to Visit  Medication Sig Dispense Refill   aspirin EC 81 MG tablet Take 81 mg by mouth every morning.     Cholecalciferol (VITAMIN D-3) 25 MCG (1000 UT) CAPS Take 1 capsule by mouth daily.     eletriptan (RELPAX) 40 MG tablet Take by mouth.     hydrocortisone (ANUSOL-HC) 2.5 % rectal cream Place 1 Application rectally 2 (two) times daily. 30 g 0   HYDROmorphone (DILAUDID) 2 MG tablet Take 1 tablet (2 mg total) by mouth every 6 (six) hours as needed for severe pain. 30 tablet 0   losartan-hydrochlorothiazide (HYZAAR) 50-12.5 MG tablet Take 1 tablet by mouth daily. 90 tablet 3   lovastatin (MEVACOR) 40 MG tablet TAKE 1 TABLET BY MOUTH EVERY DAY 90 tablet 3   MAGNESIUM PO Take by mouth.     NURTEC 75 MG TBDP TAKE 1 TABLET BY MOUTH AS NEEDED FOR MIGRAINE FIRST 15 MIN 16 tablet 5   ondansetron (ZOFRAN ODT) 4 MG disintegrating tablet Take 1 tablet (4 mg total) by mouth every 8 (eight) hours as needed for nausea or vomiting. 20 tablet 1   pioglitazone (ACTOS) 15 MG tablet Take 1 tablet (15 mg total) by mouth daily. 90 tablet 3   polyethylene glycol powder (GLYCOLAX/MIRALAX) 17 GM/SCOOP powder Take 17 g by mouth daily. 3350 g 1   POTASSIUM PO Take by mouth.     sildenafil (VIAGRA) 100 MG tablet Take 0.5-1 tablets (50-100 mg total) by mouth daily as needed for erectile dysfunction. 5 tablet 11   VITAMIN D PO Take by mouth.     zolmitriptan (ZOMIG-ZMT) 5 MG  disintegrating tablet DISSOLVE 1 TABLET ON TONGUE AS NEEDED FOR MIGRAINE 10 tablet 5   No facility-administered medications prior to visit.    ROS: Review of Systems  Constitutional:  Positive for chills. Negative for appetite change, fatigue and unexpected weight change.  HENT:  Negative for congestion, nosebleeds, sneezing, sore throat and trouble swallowing.   Eyes:  Negative for itching and visual disturbance.  Respiratory:  Negative for cough.   Cardiovascular:  Negative for chest pain, palpitations and leg swelling.  Gastrointestinal:  Positive for abdominal distention, abdominal pain, diarrhea and nausea. Negative for blood in stool, rectal pain and vomiting.  Genitourinary:  Negative for frequency and hematuria.  Musculoskeletal:  Positive for back pain. Negative for gait problem, joint swelling and neck pain.  Skin:  Negative for rash.  Neurological:  Negative for dizziness, tremors, speech difficulty and weakness.  Psychiatric/Behavioral:  Negative for agitation, dysphoric mood and sleep disturbance. The patient is not nervous/anxious.     Objective:  BP 120/64 (BP Location: Left Arm, Patient Position: Sitting, Cuff Size: Normal)   Pulse 63   Temp 97.7 F (36.5 C) (Oral)   Ht '5\' 8"'$  (1.727 m)   Wt 250 lb (113.4 kg)   SpO2 97%  BMI 38.01 kg/m   BP Readings from Last 3 Encounters:  04/06/22 120/64  02/08/22 132/70  12/21/21 136/80    Wt Readings from Last 3 Encounters:  04/06/22 250 lb (113.4 kg)  02/08/22 251 lb 6 oz (114 kg)  12/21/21 255 lb (115.7 kg)    Physical Exam Constitutional:      General: He is in acute distress.     Appearance: He is well-developed. He is obese.     Comments: NAD  Eyes:     Conjunctiva/sclera: Conjunctivae normal.     Pupils: Pupils are equal, round, and reactive to light.  Neck:     Thyroid: No thyromegaly.     Vascular: No JVD.  Cardiovascular:     Rate and Rhythm: Normal rate and regular rhythm.     Heart sounds: Normal  heart sounds. No murmur heard.    No friction rub. No gallop.  Pulmonary:     Effort: Pulmonary effort is normal. No respiratory distress.     Breath sounds: Normal breath sounds. No wheezing or rales.  Chest:     Chest wall: No tenderness.  Abdominal:     General: Bowel sounds are normal. There is no distension.     Palpations: Abdomen is soft. There is no mass.     Tenderness: There is abdominal tenderness in the right lower quadrant. There is guarding and rebound.  Musculoskeletal:        General: No tenderness. Normal range of motion.     Cervical back: Normal range of motion.  Lymphadenopathy:     Cervical: No cervical adenopathy.  Skin:    General: Skin is warm and dry.     Findings: No rash.  Neurological:     Mental Status: He is alert and oriented to person, place, and time.     Cranial Nerves: No cranial nerve deficit.     Motor: No abnormal muscle tone.     Coordination: Coordination normal.     Gait: Gait normal.     Deep Tendon Reflexes: Reflexes are normal and symmetric.  Psychiatric:        Behavior: Behavior normal.        Thought Content: Thought content normal.        Judgment: Judgment normal.   RLQ>>RUQ pain Rebound +/- No mass He is in pain Lab Results  Component Value Date   WBC 7.1 03/04/2022   HGB 15.2 03/04/2022   HCT 44 03/04/2022   PLT 226.0 11/25/2021   GLUCOSE 110 (H) 11/25/2021   CHOL 136 11/25/2021   TRIG 152.0 (H) 11/25/2021   HDL 41.60 11/25/2021   LDLDIRECT 58.0 12/08/2020   LDLCALC 64 11/25/2021   ALT 26 11/25/2021   AST 19 11/25/2021   NA 139 11/25/2021   K 4.1 03/04/2022   CL 107 03/04/2022   CREATININE 1.0 03/04/2022   BUN 19 11/25/2021   CO2 21 03/04/2022   TSH 3.87 11/25/2021   PSA 0.47 11/25/2021   HGBA1C 5.3 11/25/2021    DG Abd 1 View  Result Date: 12/08/2021 CLINICAL DATA:  Patient in for lithotripsy. EXAM: ABDOMEN - 1 VIEW COMPARISON:  Abdomen images 12/01/2021. CT 11/20/2021. Ultrasound 12/01/2021 11/20/2021.  FINDINGS: Previously marked calcific density in the right lower pelvis remains in unchanged position and may represent distal right ureteral stone. Multiple pelvic calcifications are noted in the left lower pelvis, most consistent phleboliths. No bowel distention. Degenerative change lumbar spine and both hips. IMPRESSION: Previously marked calcific density in the  right lower pelvis remains in unchanged position may represent a distal right ureteral stone. Electronically Signed   By: Marcello Moores  Register M.D.   On: 12/08/2021 07:09    Assessment & Plan:   Problem List Items Addressed This Visit       Other   RLQ abdominal pain - Primary    Pt was sent to ER R/o acute abdomen Toradol 60 mg IM      Nausea    With dry heaves Zofran 4 mg IM Pt was sent to ER         No orders of the defined types were placed in this encounter.     Follow-up: No follow-ups on file.  Walker Kehr, MD

## 2022-05-02 ENCOUNTER — Encounter: Payer: Self-pay | Admitting: Internal Medicine

## 2022-05-02 NOTE — Progress Notes (Unsigned)
    Subjective:    Patient ID: Ruben West, male    DOB: 04/28/1955, 67 y.o.   MRN: [id number]      HPI Ruben West is here for No chief complaint on file.   He is here for an acute visit for cold symptoms.  His symptoms started  He is experiencing   He has tried taking      Medications and allergies reviewed with patient and updated if appropriate.  Current Outpatient Medications on File Prior to Visit  Medication Sig Dispense Refill   aspirin EC 81 MG tablet Take 81 mg by mouth every morning.     Cholecalciferol (VITAMIN D-3) 25 MCG (1000 UT) CAPS Take 1 capsule by mouth daily.     eletriptan (RELPAX) 40 MG tablet Take by mouth.     hydrocortisone (ANUSOL-HC) 2.5 % rectal cream Place 1 Application rectally 2 (two) times daily. 30 g 0   HYDROmorphone (DILAUDID) 2 MG tablet Take 1 tablet (2 mg total) by mouth every 6 (six) hours as needed for severe pain. 30 tablet 0   losartan-hydrochlorothiazide (HYZAAR) 50-12.5 MG tablet Take 1 tablet by mouth daily. 90 tablet 3   lovastatin (MEVACOR) 40 MG tablet TAKE 1 TABLET BY MOUTH EVERY DAY 90 tablet 3   MAGNESIUM PO Take by mouth.     NURTEC 75 MG TBDP TAKE 1 TABLET BY MOUTH AS NEEDED FOR MIGRAINE FIRST 15 MIN 16 tablet 5   ondansetron (ZOFRAN ODT) 4 MG disintegrating tablet Take 1 tablet (4 mg total) by mouth every 8 (eight) hours as needed for nausea or vomiting. 20 tablet 1   pioglitazone (ACTOS) 15 MG tablet Take 1 tablet (15 mg total) by mouth daily. 90 tablet 3   polyethylene glycol powder (GLYCOLAX/MIRALAX) 17 GM/SCOOP powder Take 17 g by mouth daily. 3350 g 1   POTASSIUM PO Take by mouth.     sildenafil (VIAGRA) 100 MG tablet Take 0.5-1 tablets (50-100 mg total) by mouth daily as needed for erectile dysfunction. 5 tablet 11   VITAMIN D PO Take by mouth.     zolmitriptan (ZOMIG-ZMT) 5 MG disintegrating tablet DISSOLVE 1 TABLET ON TONGUE AS NEEDED FOR MIGRAINE 10 tablet 5   No current facility-administered medications on file  prior to visit.    Review of Systems     Objective:  There were no vitals filed for this visit. BP Readings from Last 3 Encounters:  04/06/22 132/80  04/06/22 120/64  02/08/22 132/70   Wt Readings from Last 3 Encounters:  04/06/22 250 lb (113.4 kg)  02/08/22 251 lb 6 oz (114 kg)  12/21/21 255 lb (115.7 kg)   There is no height or weight on file to calculate BMI.    Physical Exam         Assessment & Plan:    See Problem List for Assessment and Plan of chronic medical problems.

## 2022-05-02 NOTE — Patient Instructions (Addendum)
       Medications changes include :   Augmentin for sinus infection, prednisone for neck pain    Return if symptoms worsen or fail to improve.

## 2022-05-03 ENCOUNTER — Ambulatory Visit: Payer: BC Managed Care – PPO | Admitting: Internal Medicine

## 2022-05-03 VITALS — BP 142/80 | HR 77 | Temp 98.6°F | Ht 68.0 in | Wt 256.0 lb

## 2022-05-03 DIAGNOSIS — J0100 Acute maxillary sinusitis, unspecified: Secondary | ICD-10-CM | POA: Diagnosis not present

## 2022-05-03 DIAGNOSIS — M542 Cervicalgia: Secondary | ICD-10-CM

## 2022-05-03 DIAGNOSIS — I10 Essential (primary) hypertension: Secondary | ICD-10-CM

## 2022-05-03 MED ORDER — PREDNISONE 20 MG PO TABS: 40.0000 mg | ORAL_TABLET | Freq: Every day | ORAL | 0 refills | Status: AC

## 2022-05-03 MED ORDER — AMOXICILLIN-POT CLAVULANATE 875-125 MG PO TABS: 1.0000 | ORAL_TABLET | Freq: Two times a day (BID) | ORAL | 0 refills | Status: DC

## 2022-06-21 ENCOUNTER — Ambulatory Visit: Payer: BC Managed Care – PPO | Admitting: Internal Medicine

## 2022-06-21 VITALS — BP 140/88 | HR 65 | Temp 98.0°F | Ht 68.0 in | Wt 255.5 lb

## 2022-06-21 DIAGNOSIS — Z0001 Encounter for general adult medical examination with abnormal findings: Secondary | ICD-10-CM

## 2022-06-21 DIAGNOSIS — Z125 Encounter for screening for malignant neoplasm of prostate: Secondary | ICD-10-CM

## 2022-06-21 DIAGNOSIS — N1832 Chronic kidney disease, stage 3b: Secondary | ICD-10-CM

## 2022-06-21 DIAGNOSIS — I10 Essential (primary) hypertension: Secondary | ICD-10-CM | POA: Diagnosis not present

## 2022-06-21 DIAGNOSIS — E559 Vitamin D deficiency, unspecified: Secondary | ICD-10-CM | POA: Diagnosis not present

## 2022-06-21 DIAGNOSIS — E7800 Pure hypercholesterolemia, unspecified: Secondary | ICD-10-CM

## 2022-06-21 DIAGNOSIS — E538 Deficiency of other specified B group vitamins: Secondary | ICD-10-CM

## 2022-06-21 DIAGNOSIS — K760 Fatty (change of) liver, not elsewhere classified: Secondary | ICD-10-CM | POA: Diagnosis not present

## 2022-06-21 LAB — LIPID PANEL
Cholesterol: 123 mg/dL (ref 0–200)
HDL: 36.2 mg/dL — ABNORMAL LOW (ref >39.00)
NonHDL: 87.08
Total CHOL/HDL Ratio: 3
Triglycerides: 228 mg/dL — ABNORMAL HIGH (ref 0.0–149.0)
VLDL: 45.6 mg/dL — ABNORMAL HIGH (ref 0.0–40.0)

## 2022-06-21 LAB — URINALYSIS, ROUTINE W REFLEX MICROSCOPIC
Bilirubin Urine: NEGATIVE
Hgb urine dipstick: NEGATIVE
Ketones, ur: NEGATIVE
Leukocytes,Ua: NEGATIVE
Nitrite: NEGATIVE
RBC / HPF: NONE SEEN (ref 0–?)
Specific Gravity, Urine: >=1.03 — AB (ref 1.000–1.030)
Total Protein, Urine: NEGATIVE
Urine Glucose: NEGATIVE
Urobilinogen, UA: 0.2 (ref 0.0–1.0)
pH: 6 (ref 5.0–8.0)

## 2022-06-21 LAB — CBC WITH DIFFERENTIAL/PLATELET
Basophils Absolute: 0.1 10*3/uL (ref 0.0–0.1)
Basophils Relative: 1 % (ref 0.0–3.0)
Eosinophils Absolute: 0.1 10*3/uL (ref 0.0–0.7)
Eosinophils Relative: 2 % (ref 0.0–5.0)
HCT: 44.3 % (ref 39.0–52.0)
Hemoglobin: 15.5 g/dL (ref 13.0–17.0)
Lymphocytes Relative: 32.2 % (ref 12.0–46.0)
Lymphs Abs: 2.3 10*3/uL (ref 0.7–4.0)
MCHC: 34.9 g/dL (ref 30.0–36.0)
MCV: 88.5 fl (ref 78.0–100.0)
Monocytes Absolute: 0.6 10*3/uL (ref 0.1–1.0)
Monocytes Relative: 9.1 % (ref 3.0–12.0)
Neutro Abs: 4 10*3/uL (ref 1.4–7.7)
Neutrophils Relative %: 55.7 % (ref 43.0–77.0)
Platelets: 185 10*3/uL (ref 150.0–400.0)
RBC: 5.01 Mil/uL (ref 4.22–5.81)
RDW: 13.8 % (ref 11.5–15.5)
WBC: 7.1 10*3/uL (ref 4.0–10.5)

## 2022-06-21 LAB — BASIC METABOLIC PANEL
BUN: 16 mg/dL (ref 6–23)
CO2: 28 mEq/L (ref 19–32)
Calcium: 9.3 mg/dL (ref 8.4–10.5)
Chloride: 105 mEq/L (ref 96–112)
Creatinine, Ser: 1.07 mg/dL (ref 0.40–1.50)
GFR: 72.17 mL/min (ref >60.00)
Glucose, Bld: 103 mg/dL — ABNORMAL HIGH (ref 70–99)
Potassium: 4.1 mEq/L (ref 3.5–5.1)
Sodium: 142 mEq/L (ref 135–145)

## 2022-06-21 LAB — HEMOGLOBIN A1C: Hgb A1c MFr Bld: 5.2 % (ref 4.6–6.5)

## 2022-06-21 LAB — HEPATIC FUNCTION PANEL
ALT: 26 U/L (ref 0–53)
AST: 24 U/L (ref 0–37)
Albumin: 4.1 g/dL (ref 3.5–5.2)
Alkaline Phosphatase: 58 U/L (ref 39–117)
Bilirubin, Direct: 0.1 mg/dL (ref 0.0–0.3)
Total Bilirubin: 0.7 mg/dL (ref 0.2–1.2)
Total Protein: 6.3 g/dL (ref 6.0–8.3)

## 2022-06-21 LAB — MICROALBUMIN / CREATININE URINE RATIO
Creatinine,U: 155.8 mg/dL
Microalb Creat Ratio: 0.4 mg/g (ref 0.0–30.0)
Microalb, Ur: <0.7 mg/dL (ref 0.0–1.9)

## 2022-06-21 LAB — LDL CHOLESTEROL, DIRECT: Direct LDL: 48 mg/dL

## 2022-06-21 MED ORDER — LOSARTAN POTASSIUM-HCTZ 100-12.5 MG PO TABS: 1.0000 | ORAL_TABLET | Freq: Every day | ORAL | 3 refills | Status: DC

## 2022-06-21 MED ORDER — TIRZEPATIDE 2.5 MG/0.5ML ~~LOC~~ SOAJ: 2.5000 mg | SUBCUTANEOUS | 11 refills | Status: DC

## 2022-06-21 NOTE — Progress Notes (Unsigned)
Patient ID: Ruben West, male   DOB: June 19, 1955, 67 y.o.   MRN: [id number]         Chief Complaint:: wellness exam and htn, hold, dm, osa       HPI:  Ruben West is a 67 y.o. male here for wellness exam has eye exam sched next month;  declines covid booster, shingrix, o/w up to date                        Also Pt denies chest pain, increased sob or doe, wheezing, orthopnea, PND, increased LE swelling, palpitations, dizziness or syncope.   Pt denies fever, wt loss, night sweats, loss of appetite, or other constitutional symptoms   Pt denies polydipsia, polyuria, or new focal neuro s/s.   Unable to lose wt.  Now on CPAP with good compliance.  Still smoking cigars, not ready to quit.   Wt Readings from Last 3 Encounters:  06/21/22 255 lb 8 oz (115.9 kg)  05/03/22 256 lb (116.1 kg)  04/06/22 250 lb (113.4 kg)   BP Readings from Last 3 Encounters:  06/21/22 (!) 140/88  05/03/22 (!) 142/80  04/06/22 132/80   Immunization History  Administered Date(s) Administered   Fluad Quad(high Dose 65+) 12/08/2020, 12/21/2021   Influenza Split 01/29/2011   Influenza, Seasonal, Injecte, Preservative Fre 01/10/2015   Influenza,inj,Quad PF,6+ Mos 02/13/2016, 01/27/2017   Influenza,inj,quad, With Preservative 01/27/2017   Influenza-Unspecified 01/29/2011, 01/10/2015, 02/13/2016, 01/27/2017   PFIZER(Purple Top)SARS-COV-2 Vaccination 11/04/2019, 11/25/2019   PNEUMOCOCCAL CONJUGATE-20 12/21/2021   Pneumococcal Polysaccharide-23 12/08/2020   Td 11/28/2002   Tdap 03/04/2014   Zoster, Live 03/04/2014   Health Maintenance Due  Topic Date Due   OPHTHALMOLOGY EXAM  Never done      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   FATTY LIVER DISEASE 05/09/2008   GERD 04/04/2007   no per pt   Headache    Migraines a few times a year 1993   Heart murmur    History of kidney stones    Hypercholesteremia     HYPERGLYCEMIA 04/04/2007   HYPERTENSION 11/23/2006   Migraines    NASH (nonalcoholic steatohepatitis)    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 He has never used smokeless tobacco. He reports that he does not currently use alcohol after a past usage of about 1.0 standard drink of alcohol per week. He reports that he does not use drugs. family history includes Emphysema in his father; Healthy in his daughter; Heart attack in his mother; Seizures in his grandchild and mother. Allergies  Allergen Reactions   Gabapentin Nausea And Vomiting   Current Outpatient Medications on File Prior to Visit  Medication Sig Dispense Refill   aspirin EC 81 MG tablet Take 81 mg by mouth every morning.     Cholecalciferol (VITAMIN D-3) 25 MCG (1000 UT) CAPS Take 1 capsule  by mouth daily.     eletriptan (RELPAX) 40 MG tablet Take by mouth.     hydrocortisone (ANUSOL-HC) 2.5 % rectal cream Place 1 Application rectally 2 (two) times daily. 30 g 0   lovastatin (MEVACOR) 40 MG tablet TAKE 1 TABLET BY MOUTH EVERY DAY 90 tablet 3   MAGNESIUM PO Take by mouth.     NURTEC 75 MG TBDP TAKE 1 TABLET BY MOUTH AS NEEDED FOR MIGRAINE FIRST 15 MIN 16 tablet 5   ondansetron (ZOFRAN ODT) 4 MG disintegrating tablet Take 1 tablet (4 mg total) by mouth every 8 (eight) hours as needed for nausea or vomiting. 20 tablet 1   polyethylene glycol powder (GLYCOLAX/MIRALAX) 17 GM/SCOOP powder Take 17 g by mouth daily. 3350 g 1   POTASSIUM PO Take by mouth.      sildenafil (VIAGRA) 100 MG tablet Take 0.5-1 tablets (50-100 mg total) by mouth daily as needed for erectile dysfunction. 5 tablet 11   VITAMIN D PO Take by mouth.     zolmitriptan (ZOMIG-ZMT) 5 MG disintegrating tablet DISSOLVE 1 TABLET ON TONGUE AS NEEDED FOR MIGRAINE 10 tablet 5   No current facility-administered medications on file prior to visit.        ROS:  All others reviewed and negative.  Objective        PE:  BP (!) 140/88   Pulse 65   Temp 98 F (36.7 C) (Temporal)   Ht 5\' 8"  (1.727 m)   Wt 255 lb 8 oz (115.9 kg)   SpO2 95%   BMI 38.85 kg/m                 Constitutional: Pt appears in NAD               HENT: Head: NCAT.                Right Ear: External ear normal.                 Left Ear: External ear normal.                Eyes: . Pupils are equal, round, and reactive to light. Conjunctivae and EOM are normal               Nose: without d/c or deformity               Neck: Neck supple. Gross normal ROM               Cardiovascular: Normal rate and regular rhythm.                 Pulmonary/Chest: Effort normal and breath sounds without rales or wheezing.                Abd:  Soft, NT, ND, + BS, no organomegaly               Neurological: Pt is alert. At baseline orientation, motor grossly intact               Skin: Skin is warm. No rashes, no other new lesions, LE edema - none               Psychiatric: Pt behavior is normal without agitation   Micro: none  Cardiac tracings I have personally interpreted today:  none  Pertinent Radiological findings (summarize): none   Lab Results  Component Value Date   WBC 7.1 06/21/2022   HGB 15.5 06/21/2022  HCT 44.3 06/21/2022   PLT 185.0 06/21/2022   GLUCOSE 103 (H) 06/21/2022   CHOL 123 06/21/2022   TRIG 228.0 (H) 06/21/2022   HDL 36.20 (L) 06/21/2022   LDLDIRECT 48.0 06/21/2022   LDLCALC 64 11/25/2021   ALT 26 06/21/2022   AST 24 06/21/2022   NA 142 06/21/2022   K 4.1 06/21/2022   CL 105 06/21/2022    CREATININE 1.07 06/21/2022   BUN 16 06/21/2022   CO2 28 06/21/2022   TSH 2.02 06/21/2022   PSA 0.36 06/21/2022   HGBA1C 5.2 06/21/2022   MICROALBUR <0.7 06/21/2022   Assessment/Plan:  Ruben West is a 67 y.o. White or Caucasian [1] male with  has a past medical history of ALLERGIC RHINITIS (11/23/2006), Back pain, ERECTILE DYSFUNCTION, ORGANIC (04/04/2007), FATTY LIVER DISEASE (05/09/2008), GERD (04/04/2007), Headache, Heart murmur, History of kidney stones, Hypercholesteremia, HYPERGLYCEMIA (04/04/2007), HYPERTENSION (11/23/2006), Migraines, NASH (nonalcoholic steatohepatitis), Other testicular hypofunction (05/19/2007), PUD (peptic ulcer disease), and Sleep apnea.  Encounter for well adult exam with abnormal findings Age and sex appropriate education and counseling updated with regular exercise and diet Referrals for preventative services - pt has eye exam next month Immunizations addressed - declines covid booster, shingrix Smoking counseling  - counseled to quit, pt not ready Evidence for depression or other mood disorder - none significant Most recent labs reviewed. I have personally reviewed and have noted: 1) the patient's medical and social history 2) The patient's current medications and supplements 3) The patient's height, weight, and BMI have been recorded in the chart   Essential hypertension BP Readings from Last 3 Encounters:  06/21/22 (!) 140/88  05/03/22 (!) 142/80  04/06/22 132/80   Unocntrolled, pt to increase to hyzaar 100 - 12.5 mg qd, also for Card Ct score   Hyperlipidemia Lab Results  Component Value Date   [place] 64 11/25/2021   Stable, pt to continue current statin lovastatin 40 mg qd   Stage 3b chronic kidney disease (CKD) ([place]) Lab Results  Component Value Date   CREATININE 1.07 06/21/2022   Stable overall, cont to avoid nephrotoxins   Vitamin D deficiency Last vitamin D Lab Results  Component Value Date   VD25OH 22.84 (L)  06/21/2022   Low, to start oral replacement   Fatty liver disease, nonalcoholic For wt loss - ok to change actos to mounjaro 2.5 mg once weekly  Followup: Return in about 6 months (around 12/22/2022).  Cathlean Cower, MD 06/23/2022 8:59 PM [facility]

## 2022-06-21 NOTE — Patient Instructions (Signed)
Ok to stop the actos and hyzaar 50-12.5 mg per day  Please take all new medication as prescribed - the hyzaar 100-12.5 mg per day, and mounjaro 2.5 mg weekly  Please continue all other medications as before, and refills have been done if requested.  Please have the pharmacy call with any other refills you may need.  Please continue your efforts at being more active, low cholesterol diet, and weight control.  You are otherwise up to date with prevention measures today.  Please keep your appointments with your specialists as you may have planned  You will be contacted regarding the referral for: Cardiac CT score  Please go to the LAB at the blood drawing area for the tests to be done  You will be contacted by phone if any changes need to be made immediately.  Otherwise, you will receive a letter about your results with an explanation, but please check with MyChart first.  Please remember to sign up for MyChart if you have not done so, as this will be important to you in the future with finding out test results, communicating by private email, and scheduling acute appointments online when needed.  Please make an Appointment to return in 6 months, or sooner if needed

## 2022-06-22 LAB — VITAMIN D 25 HYDROXY (VIT D DEFICIENCY, FRACTURES): VITD: 22.84 ng/mL — ABNORMAL LOW (ref 30.00–100.00)

## 2022-06-22 LAB — VITAMIN B12: Vitamin B-12: 254 pg/mL (ref 211–911)

## 2022-06-22 LAB — PSA: PSA: 0.36 ng/mL (ref 0.10–4.00)

## 2022-06-22 LAB — TSH: TSH: 2.02 u[IU]/mL (ref 0.35–5.50)

## 2022-06-23 ENCOUNTER — Encounter: Payer: Self-pay | Admitting: Internal Medicine

## 2022-06-23 NOTE — Assessment & Plan Note (Signed)
Lab Results  Component Value Date   CREATININE 1.07 06/21/2022   Stable overall, cont to avoid nephrotoxins

## 2022-06-23 NOTE — Assessment & Plan Note (Signed)
Last vitamin D Lab Results  Component Value Date   VD25OH 22.84 (L) 06/21/2022   Low, to start oral replacement

## 2022-06-23 NOTE — Assessment & Plan Note (Signed)
For wt loss - ok to change actos to mounjaro 2.5 mg once weekly

## 2022-06-23 NOTE — Assessment & Plan Note (Signed)
Lab Results  Component Value Date   LDLCALC 64 11/25/2021   Stable, pt to continue current statin lovastatin 40 mg qd  

## 2022-06-23 NOTE — Assessment & Plan Note (Signed)
BP Readings from Last 3 Encounters:  06/21/22 (!) 140/88  05/03/22 (!) 142/80  04/06/22 132/80   Unocntrolled, pt to increase to hyzaar 100 - 12.5 mg qd, also for Card Ct score

## 2022-06-23 NOTE — Assessment & Plan Note (Signed)
Age and sex appropriate education and counseling updated with regular exercise and diet Referrals for preventative services - pt has eye exam next month Immunizations addressed - declines covid booster, shingrix Smoking counseling  - counseled to quit, pt not ready Evidence for depression or other mood disorder - none significant Most recent labs reviewed. I have personally reviewed and have noted: 1) the patient's medical and social history 2) The patient's current medications and supplements 3) The patient's height, weight, and BMI have been recorded in the chart

## 2022-07-26 ENCOUNTER — Ambulatory Visit (HOSPITAL_BASED_OUTPATIENT_CLINIC_OR_DEPARTMENT_OTHER)
Admission: RE | Admit: 2022-07-26 | Discharge: 2022-07-26 | Disposition: A | Discharge status: Home / Self Care | Payer: BC Managed Care – PPO | Source: Ambulatory Visit | Attending: Internal Medicine | Admitting: Internal Medicine

## 2022-07-26 DIAGNOSIS — K760 Fatty (change of) liver, not elsewhere classified: Secondary | ICD-10-CM

## 2022-07-26 DIAGNOSIS — I10 Essential (primary) hypertension: Secondary | ICD-10-CM

## 2022-07-26 DIAGNOSIS — E7800 Pure hypercholesterolemia, unspecified: Secondary | ICD-10-CM

## 2022-07-27 ENCOUNTER — Other Ambulatory Visit: Payer: Self-pay | Admitting: Internal Medicine

## 2022-07-27 DIAGNOSIS — R931 Abnormal findings on diagnostic imaging of heart and coronary circulation: Secondary | ICD-10-CM

## 2022-07-27 DIAGNOSIS — I2510 Atherosclerotic heart disease of native coronary artery without angina pectoris: Secondary | ICD-10-CM

## 2022-08-01 ENCOUNTER — Other Ambulatory Visit: Payer: Self-pay | Admitting: Internal Medicine

## 2022-08-01 DIAGNOSIS — R918 Other nonspecific abnormal finding of lung field: Secondary | ICD-10-CM

## 2022-08-01 DIAGNOSIS — I2510 Atherosclerotic heart disease of native coronary artery without angina pectoris: Secondary | ICD-10-CM

## 2022-08-04 ENCOUNTER — Ambulatory Visit: Payer: BC Managed Care – PPO | Admitting: Student

## 2022-08-04 ENCOUNTER — Encounter: Payer: Self-pay | Admitting: Student

## 2022-08-04 VITALS — BP 138/82 | HR 94 | Temp 98.2°F | Ht 68.5 in | Wt 254.4 lb

## 2022-08-04 DIAGNOSIS — R918 Other nonspecific abnormal finding of lung field: Secondary | ICD-10-CM

## 2022-08-04 NOTE — Progress Notes (Signed)
Synopsis: Referred for lung mass by Corwin Levins, MD  Subjective:   PATIENT ID: Ruben West GENDER: male DOB: [birth year], MRN: [id number]  Chief Complaint  Patient presents with   Consult    Had CT with cardiologist-mass was seen on scan. Has a slight productive cough with clear phlegm-advises its related to pollen   66yM with history of OSA on CPAP, cigar smoking referred for 2.8 cm part solid nodule found incidentally on CT cardiac scoring  Minor cough producitve of clear phlegm. He thinks postnasal drainage. He has no DOE. No hemoptysis. Had a fevera bout a week ago but wondered if he was just stressed out.   He doesn't recall feeling like he  had a pneumonia or  anything before his CT cardiac.   Otherwise pertinent review of systems is negative.  Mother with lung cancer, aunt as well  He is a long Production assistant, radio. Smoked cigars frequently for about a year recently but only intermittently in past. No cigarettes. No vaping, MJ.   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   PUD (peptic ulcer disease)    Sleep apnea    on CPAP     Family History  Problem Relation Age of Onset   Heart attack Mother        MI, deceased 48   Seizures Mother    Emphysema Father        Deceased, 10   Healthy Daughter        x2   Seizures Grandchild    Cancer Neg Hx        no cancer in immediate family   Colon cancer Neg Hx    Esophageal cancer Neg Hx    Rectal cancer Neg Hx    Stomach cancer Neg Hx    Colon polyps Neg Hx      Past Surgical History:  Procedure  Laterality Date   COLONOSCOPY  09/09/2017   ELECTROCARDIOGRAM  02/21/2006   EXTRACORPOREAL SHOCK WAVE LITHOTRIPSY Right 12/07/2021   Procedure: EXTRACORPOREAL SHOCK WAVE LITHOTRIPSY (ESWL);  Surgeon: Jerilee Field, MD;  Location: [facility];  Service: Urology;  Laterality: Right;   KNEE SURGERY Left    2015   VASECTOMY      Social History   Socioeconomic History   Marital status: Married    Spouse name: Millie   Number of children: 2   Years of education: 11 grade   Highest education level: Not on file  Occupational History   Occupation: Scientist, water quality: [employer]  Tobacco Use   Smoking status: Former    Types: Cigars   Smokeless tobacco: Never   Tobacco comments:    Quit smoking a year ago. PAP 08/04/22  Vaping Use   Vaping Use: Never used  Substance and Sexual Activity   Alcohol use: Not Currently    Alcohol/week: 1.0 standard drink of alcohol    Types: 1 Cans of beer per week   Drug use: No  Sexual activity: Yes  Other Topics Concern   Not on file  Social History Narrative   Lives with wife in a one story home.  Has 2 grandchildren living with him.     Works as a Naval architect.     Education: 10th grade.        Right hand    Social Determinants of Health   Financial Resource Strain: Not on file  Food Insecurity: No Food Insecurity (06/20/2022)   Hunger Vital Sign    Worried About Running Out of Food in the Last Year: Never true    Ran Out of Food in the Last Year: Never true  Transportation Needs: No Transportation Needs (06/20/2022)   PRAPARE - Administrator, Civil Service (Medical): No    Lack of Transportation (Non-Medical): No  Physical Activity: Insufficiently Active (06/20/2022)   Exercise Vital Sign    Days of Exercise per Week: 2 days    Minutes of Exercise per Session: 10 min  Stress: No Stress Concern Present (06/20/2022)   [employer] of Occupational Health - Occupational Stress Questionnaire     Feeling of Stress : Not at all  Social Connections: Moderately Isolated (06/20/2022)   Social Connection and Isolation Panel [NHANES]    Frequency of Communication with Friends and Family: More than three times a week    Frequency of Social Gatherings with Friends and Family: Once a week    Attends Religious Services: Never    Database administrator or Organizations: No    Attends Engineer, structural: Not on file    Marital Status: Married  Catering manager Violence: Not on file     Allergies  Allergen Reactions   Gabapentin Nausea And Vomiting     Outpatient Medications Prior to Visit  Medication Sig Dispense Refill   aspirin EC 81 MG tablet Take 81 mg by mouth every morning.     Cholecalciferol (VITAMIN D-3) 25 MCG (1000 UT) CAPS Take 1 capsule by mouth daily.     eletriptan (RELPAX) 40 MG tablet Take by mouth.     losartan-hydrochlorothiazide (HYZAAR) 100-12.5 MG tablet Take 1 tablet by mouth daily. 90 tablet 3   lovastatin (MEVACOR) 40 MG tablet TAKE 1 TABLET BY MOUTH EVERY DAY 90 tablet 3   MAGNESIUM PO Take by mouth.     NURTEC 75 MG TBDP TAKE 1 TABLET BY MOUTH AS NEEDED FOR MIGRAINE FIRST 15 MIN 16 tablet 5   ondansetron (ZOFRAN ODT) 4 MG disintegrating tablet Take 1 tablet (4 mg total) by mouth every 8 (eight) hours as needed for nausea or vomiting. 20 tablet 1   polyethylene glycol powder (GLYCOLAX/MIRALAX) 17 GM/SCOOP powder Take 17 g by mouth daily. 3350 g 1   POTASSIUM PO Take by mouth.     VITAMIN D PO Take by mouth.     zolmitriptan (ZOMIG-ZMT) 5 MG disintegrating tablet DISSOLVE 1 TABLET ON TONGUE AS NEEDED FOR MIGRAINE 10 tablet 5   hydrocortisone (ANUSOL-HC) 2.5 % rectal cream Place 1 Application rectally 2 (two) times daily. 30 g 0   sildenafil (VIAGRA) 100 MG tablet Take 0.5-1 tablets (50-100 mg total) by mouth daily as needed for erectile dysfunction. 5 tablet 11   tirzepatide (MOUNJARO) 2.5 MG/0.5ML Pen Inject 2.5 mg into the skin once a week. K76.0,  E11.9 2 mL 11   No facility-administered medications prior to visit.       Objective:   Physical Exam:  General appearance: 67 y.o., male,  NAD, conversant  Eyes: anicteric sclerae; PERRL, tracking appropriately HENT: NCAT; MMM Neck: Trachea midline; no lymphadenopathy, no JVD Lungs: CTAB, no crackles, no wheeze, with normal respiratory effort CV: RRR, no murmur  Abdomen: Soft, non-tender; non-distended, BS present  Extremities: No peripheral edema, warm Skin: Normal turgor and texture; no rash Psych: Appropriate affect Neuro: Alert and oriented to person and place, no focal deficit     Vitals:   08/04/22 1017  BP: 138/82  Pulse: 94  Temp: 98.2 F (36.8 C)  TempSrc: Oral  SpO2: 94%  Weight: 254 lb 6.4 oz (115.4 kg)  Height: 5' 8.5" (1.74 m)   94% on RA BMI Readings from Last 3 Encounters:  08/04/22 38.12 kg/m  06/21/22 38.85 kg/m  05/03/22 38.92 kg/m   Wt Readings from Last 3 Encounters:  08/04/22 254 lb 6.4 oz (115.4 kg)  06/21/22 255 lb 8 oz (115.9 kg)  05/03/22 256 lb (116.1 kg)     CBC    Component Value Date/Time   WBC 7.1 06/21/2022 0852   RBC 5.01 06/21/2022 0852   HGB 15.5 06/21/2022 0852   HCT 44.3 06/21/2022 0852   PLT 185.0 06/21/2022 0852   MCV 88.5 06/21/2022 0852   MCV 86.0 09/05/2015 1616   MCH 29.9 04/06/2022 1221   MCHC 34.9 06/21/2022 0852   RDW 13.8 06/21/2022 0852   LYMPHSABS 2.3 06/21/2022 0852   MONOABS 0.6 06/21/2022 0852   EOSABS 0.1 06/21/2022 0852   BASOSABS 0.1 06/21/2022 0852      Chest Imaging: CT Cardiac scoring 07/30/22 with part solid dominant nodule 2.8cm just above diaphragm LLL - new relative to CT renal stone protocol CT 1/02024, 5mm nodule LUL  Pulmonary Functions Testing Results:     No data to display           Echocardiogram 12/2013:   - Left ventricle: The cavity size was normal. Wall thickness was    normal. Systolic function was normal. The estimated ejection    fraction was in the range  of 55% to 60%. Wall motion was normal;    there were no regional wall motion abnormalities. Left    ventricular diastolic function parameters were normal.  - Atrial septum: There was increased thickness of the septum,    consistent with lipomatous hypertrophy. No defect or patent    foramen ovale was identified.  - Pulmonic valve: There was trivial regurgitation.       Assessment & Plan:   # Pulmonary nodules # Dominant LLL 2.8cm part solid nodule  Pretty limited smoking history, does have family history of lung cancer.   Plan: - CT super d chest in 8 weeks - PFTs on same day as next clinic visit in 10-12 weeks with Dr. Delton Coombes or Dr. Vania Rea, MD [facility] 08/04/2022 10:40 AM

## 2022-08-04 NOTE — Patient Instructions (Signed)
-   PFTs same day as next visit in 10-12 weeks - CT Chest in 8 weeks, feel free to call with any questions about it before next visit

## 2022-09-02 ENCOUNTER — Encounter: Payer: Self-pay | Admitting: Cardiology

## 2022-09-02 NOTE — Progress Notes (Signed)
Cardiology Office Note   Date:  09/03/2022   ID:  SALBADOR West, DOB [birth year], MRN [id number]  PCP:  Ruben Levins, MD  Cardiologist:   None Referring:  Ruben Levins, MD  No chief complaint on file.     History of Present Illness: Ruben West is a 67 y.o. male who presents for evaluation of elevated coronary calcium.  He is referred by Dr. Ardeth West.  He had coronary calcium score which was 671 with most in the LAD and a percentile of 85%.  He saw Dr. Daleen West years ago and had a normal echo in 2015.  He might get a little shortness of breath doing some physical activities.  However, he pushes a lawnmower and he is not describing any chest pressure, neck or arm discomfort.  He has not had any new palpitations, presyncope or syncope.  He is try to eat healthier recently.  Of note he has a family history.  His blood pressure and his lipids actually are well-controlled on meds.  He does have a small lung nodule which can be followed.  There are actually might be a liver nodule as well that is going to be followed.  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     Cholecalciferol (VITAMIN D-3) 25 MCG (1000 UT) CAPS Take 1 capsule by mouth daily.     eletriptan (RELPAX) 40 MG tablet Take by mouth.     ibuprofen (ADVIL) 800 MG tablet Take 800 mg by mouth every 6 (six) hours as needed for mild pain or moderate pain.     losartan-hydrochlorothiazide (HYZAAR) 100-12.5 MG tablet Take 1 tablet by mouth daily. 90 tablet 3   lovastatin (MEVACOR) 40 MG tablet TAKE 1 TABLET BY MOUTH EVERY DAY 90 tablet 3   MAGNESIUM PO Take by mouth.     NURTEC 75 MG TBDP TAKE 1 TABLET BY MOUTH AS NEEDED FOR MIGRAINE FIRST 15 MIN 16 tablet 5   ondansetron (ZOFRAN ODT) 4 MG disintegrating tablet Take 1 tablet (4 mg total) by mouth every 8 (eight) hours as needed for nausea or vomiting. 20 tablet 1   polyethylene glycol powder (GLYCOLAX/MIRALAX) 17 GM/SCOOP powder Take 17 g by mouth daily. 3350 g 1   POTASSIUM PO Take by mouth.     zolmitriptan (ZOMIG-ZMT) 5 MG disintegrating tablet DISSOLVE 1 TABLET ON TONGUE AS NEEDED FOR MIGRAINE 10 tablet 5   VITAMIN D PO Take by mouth. (Patient not taking:  Reported on 09/03/2022)     No current facility-administered medications for this visit.    Allergies:   Gabapentin    Social History:  The patient  reports that he has quit smoking. His smoking use included cigars. He has never used smokeless tobacco. He reports that he does not currently use alcohol after a past usage of about 1.0 standard drink of alcohol per week. He reports that he does not use drugs.   Family History:  The patient's family history includes Emphysema in his father; Healthy in his daughter; Heart attack in his mother; Seizures in his grandchild and mother.    ROS:  Please see the history of present illness.   Otherwise, review of systems are positive for none.   All other systems are reviewed and negative.    PHYSICAL EXAM: VS:  BP 122/72   Pulse 61   Ht 5' 8.5" (1.74 m)   Wt 258  lb (117 kg)   SpO2 97%   BMI 38.66 kg/m  , BMI Body mass index is 38.66 kg/m. GENERAL:  Well appearing HEENT:  Pupils equal round and reactive, fundi not visualized, oral mucosa unremarkable NECK:  No jugular venous distention, waveform within normal limits, carotid upstroke brisk and symmetric, no bruits, no thyromegaly LYMPHATICS:  No cervical, inguinal adenopathy LUNGS:  Clear to auscultation bilaterally BACK:  No CVA tenderness CHEST:  Unremarkable HEART:  PMI not displaced or sustained,S1 and S2 within normal limits, no S3, no S4, no clicks, no rubs, no murmurs ABD:  Flat, positive bowel sounds normal in frequency in pitch, no bruits, no rebound, no guarding, no midline pulsatile mass, no hepatomegaly, no splenomegaly EXT:  2 plus pulses throughout, no edema, no cyanosis no clubbing SKIN:  No rashes no nodules NEURO:  Cranial nerves II through XII grossly intact, motor grossly intact throughout PSYCH:  Cognitively intact, oriented to person place and time    EKG:  EKG is ordered today. The ekg ordered today demonstrates sinus rhythm, rate 61, axis within normal limits, intervals within normal limits, no acute ST-T wave changes.   Recent Labs: 06/21/2022: ALT 26; BUN 16; Creatinine, Ser 1.07; Hemoglobin 15.5; Platelets 185.0; Potassium 4.1; Sodium 142; TSH 2.02    Lipid Panel    Component Value Date/Time   CHOL 123 06/21/2022 0852   TRIG 228.0 (H) 06/21/2022 0852   HDL 36.20 (L) 06/21/2022 0852   CHOLHDL 3 06/21/2022 0852   VLDL 45.6 (H) 06/21/2022 0852   LDLCALC 64 11/25/2021 1146   LDLCALC 56 12/04/2019 1339   LDLDIRECT 48.0 06/21/2022 0852      Wt Readings from Last 3 Encounters:  09/03/22 258 lb (117 kg)  08/04/22 254 lb 6.4 oz (115.4 kg)  06/21/22 255 lb 8 oz (115.9 kg)      Other studies Reviewed: Additional studies/ records that were reviewed today include: Labs. Review of the above records demonstrates:  Please see elsewhere in the note.      ASSESSMENT AND PLAN:  Elevated coronary calcium: He does have mild shortness of breath.  However, he does not have significant overt symptoms.  Given his known coronary disease it is reasonable to screen him with a POET (Plain Old Exercise Treadmill).  We talked a lot about risk reduction.  HTN: Blood pressure is at target.  No change in therapy.  Dyslipidemia:   Direct LDL was less than 50.  He will continue the meds as listed.  Sleep apnea: He does have sleep apnea managed by  his other providers.  Obesity: We talked about this at length.  We talked about diet.  Current medicines are reviewed at length with the patient today.  The patient does not have concerns regarding medicines.  The following changes have been made:  no change  Labs/ tests ordered today include:   Orders Placed This Encounter  Procedures   Exercise Tolerance Test   EKG 12-Lead     Disposition:   FU with me in on eyear.     Signed, Rollene Rotunda, MD  09/03/2022 10:02 AM    [facility]

## 2022-09-03 ENCOUNTER — Encounter: Payer: Self-pay | Admitting: Cardiology

## 2022-09-03 ENCOUNTER — Ambulatory Visit: Payer: BC Managed Care – PPO | Attending: Cardiology | Admitting: Cardiology

## 2022-09-03 VITALS — BP 122/72 | HR 61 | Ht 68.5 in | Wt 258.0 lb

## 2022-09-03 DIAGNOSIS — I2510 Atherosclerotic heart disease of native coronary artery without angina pectoris: Secondary | ICD-10-CM | POA: Diagnosis not present

## 2022-09-03 NOTE — Patient Instructions (Signed)
    Testing/Procedures:   Your physician has requested that you have an exercise tolerance test. For further information please visit [email or web address]. Please also follow instruction sheet, as given. [address]   Follow-Up: At [facility], you and your health needs are our priority.  As part of our continuing mission to provide you with exceptional heart care, we have created designated Provider Care Teams.  These Care Teams include your primary Cardiologist (physician) and Advanced Practice Providers (APPs -  Physician Assistants and Nurse Practitioners) who all work together to provide you with the care you need, when you need it.  We recommend signing up for the patient portal called "MyChart".  Sign up information is provided on this After Visit Summary.  MyChart is used to connect with patients for Virtual Visits (Telemedicine).  Patients are able to view lab/test results, encounter notes, upcoming appointments, etc.  Non-urgent messages can be sent to your provider as well.   To learn more about what you can do with MyChart, go to [email or web address].    Your next appointment:   12 month(s)  Provider:   JAMES HOCHREIN MD    

## 2022-09-06 ENCOUNTER — Ambulatory Visit (INDEPENDENT_AMBULATORY_CARE_PROVIDER_SITE_OTHER): Payer: Worker's Compensation | Admitting: Orthopaedic Surgery

## 2022-09-06 ENCOUNTER — Other Ambulatory Visit (INDEPENDENT_AMBULATORY_CARE_PROVIDER_SITE_OTHER): Payer: Worker's Compensation

## 2022-09-06 ENCOUNTER — Encounter: Payer: Self-pay | Admitting: Orthopaedic Surgery

## 2022-09-06 DIAGNOSIS — M25562 Pain in left knee: Secondary | ICD-10-CM | POA: Diagnosis not present

## 2022-09-06 MED ORDER — LIDOCAINE HCL 1 % IJ SOLN
3.0000 mL | INTRAMUSCULAR | Status: AC | PRN
Start: 2022-09-06 — End: 2022-09-06
  Administered 2022-09-06: 3 mL

## 2022-09-06 MED ORDER — METHYLPREDNISOLONE ACETATE 40 MG/ML IJ SUSP
40.0000 mg | INTRAMUSCULAR | Status: AC | PRN
Start: 2022-09-06 — End: 2022-09-06
  Administered 2022-09-06: 40 mg via INTRA_ARTICULAR

## 2022-09-06 NOTE — Progress Notes (Signed)
The patient is about 2 months out from an injury where he fell off the back of a trailer where he was climbing into and this was work-related.  He has been in physical therapy due to spine issues as a relates to this fall but he did was noted to have continued left knee pain and swelling.  He does have a remote history about 10 years ago having a left knee arthroscopy with a partial medial meniscectomy.  He says that he had done well until recently after this mechanical fall.  He has not had any type of injections in that knee recently.  He has not had therapy on the knee but he is in physical therapy for his spine.  On examination of his left knee today there is no effusion.  His range of motion is full.  There is a little bit of prepatellar swelling.  The knee is ligamentously stable and McMurray's and Lachman's exams are negative.  There is no redness around the knee.  3 views of the left knee show well-maintained joint space with no acute findings.  From my standpoint we will try conservative treatment with outpatient physical therapy with any modalities per therapist discretion that can help decrease his knee pain.  I did offer and provide a steroid injection in his left knee joint that he tolerated very well.  We will see him back in 4 weeks to see how he is doing overall.  I did see him with his case manager today as well.  All questions and concerns were addressed and answered.      Procedure Note  Patient: Ruben West             Date of Birth: 1956-03-13           MRN: [id number]             Visit Date: 09/06/2022  Procedures: Visit Diagnoses:  1. Acute pain of left knee     Large Joint Inj: L knee on 09/06/2022 10:18 AM Indications: diagnostic evaluation and pain Details: 22 G 1.5 in needle, superolateral approach  Arthrogram: No  Medications: 3 mL lidocaine 1 %; 40 mg methylPREDNISolone acetate 40 MG/ML Outcome: tolerated well, no immediate complications Procedure,  treatment alternatives, risks and benefits explained, specific risks discussed. Consent was given by the patient. Immediately prior to procedure a time out was called to verify the correct patient, procedure, equipment, support staff and site/side marked as required. Patient was prepped and draped in the usual sterile fashion.

## 2022-09-29 ENCOUNTER — Ambulatory Visit (HOSPITAL_COMMUNITY)
Admission: RE | Admit: 2022-09-29 | Discharge: 2022-09-29 | Disposition: A | Discharge status: Home / Self Care | Payer: BC Managed Care – PPO | Source: Ambulatory Visit | Attending: Student | Admitting: Student

## 2022-09-29 DIAGNOSIS — R918 Other nonspecific abnormal finding of lung field: Secondary | ICD-10-CM | POA: Diagnosis not present

## 2022-09-29 DIAGNOSIS — I700 Atherosclerosis of aorta: Secondary | ICD-10-CM | POA: Diagnosis not present

## 2022-09-29 DIAGNOSIS — I2510 Atherosclerotic heart disease of native coronary artery without angina pectoris: Secondary | ICD-10-CM | POA: Diagnosis not present

## 2022-10-06 ENCOUNTER — Telehealth (HOSPITAL_COMMUNITY): Payer: Self-pay | Admitting: *Deleted

## 2022-10-06 NOTE — Telephone Encounter (Signed)
Patient given reminder call for upcoming ETT on 10/11/22 at 3:30. Told to arrive 15 minutes before appointment time. Ruben West

## 2022-10-08 ENCOUNTER — Telehealth: Payer: Self-pay | Admitting: Orthopaedic Surgery

## 2022-10-08 NOTE — Telephone Encounter (Signed)
Pt would like to know if he needs his appt for 07/29 he feels as if he can go back to work full duty no restrictions just needs an okay from provider being this is WC

## 2022-10-11 ENCOUNTER — Ambulatory Visit (HOSPITAL_COMMUNITY): Payer: BC Managed Care – PPO | Attending: Cardiology

## 2022-10-11 ENCOUNTER — Other Ambulatory Visit: Payer: Self-pay | Admitting: Physical Medicine and Rehabilitation

## 2022-10-11 DIAGNOSIS — I2510 Atherosclerotic heart disease of native coronary artery without angina pectoris: Secondary | ICD-10-CM | POA: Insufficient documentation

## 2022-10-11 LAB — EXERCISE TOLERANCE TEST
Angina Index: 0
Duke Treadmill Score: 7
Exercise duration (min): 6 min
Exercise duration (sec): 45 s
MPHR: 153 {beats}/min
Peak HR: 142 {beats}/min
Percent HR: 93 %
Rest HR: 91 {beats}/min
ST Depression (mm): 0 mm

## 2022-10-11 NOTE — Telephone Encounter (Signed)
Per follow up no formal follow needed 10/2020.

## 2022-10-13 NOTE — Telephone Encounter (Signed)
I dont see any forms on [employer]

## 2022-10-15 NOTE — Telephone Encounter (Signed)
Form given to Dr. Magnus Ivan, can you please see if he completed in the outbox?

## 2022-10-15 NOTE — Telephone Encounter (Signed)
On your desk, [place]. Thanks

## 2022-10-18 ENCOUNTER — Ambulatory Visit (INDEPENDENT_AMBULATORY_CARE_PROVIDER_SITE_OTHER): Payer: BC Managed Care – PPO | Admitting: Emergency Medicine

## 2022-10-18 DIAGNOSIS — R918 Other nonspecific abnormal finding of lung field: Secondary | ICD-10-CM | POA: Diagnosis not present

## 2022-10-18 NOTE — Progress Notes (Signed)
Full PFT performed today. °

## 2022-10-18 NOTE — Patient Instructions (Signed)
Full PFT performed today. °

## 2022-10-20 LAB — PULMONARY FUNCTION TEST
DL/VA % pred: 107 %
DLCO cor % pred: 111 %
DLCO cor: 28.17 ml/min/mmHg
DLCO unc % pred: 111 %
DLCO unc: 28.17 ml/min/mmHg
FEF 25-75 Post: 4.36 L/sec
FEF2575-%Change-Post: 2 %
FEF2575-%Pred-Post: 175 %
FEF2575-%Pred-Pre: 170 %
FEV1-%Pred-Post: 119 %
FEV1-%Pred-Pre: 118 %
FEV1-Post: 3.81 L
FEV1-Pre: 3.77 L
FEV1FVC-%Change-Post: 2 %
FEV1FVC-%Pred-Pre: 112 %
FEV6-%Pred-Post: 109 %
FEV6-%Pred-Pre: 111 %
FEV6-Post: 4.46 L
FEV6-Pre: 4.51 L
FEV6FVC-%Change-Post: 0 %
FEV6FVC-%Pred-Post: 105 %
FEV6FVC-%Pred-Pre: 105 %
FVC-%Change-Post: -1 %
FVC-%Pred-Post: 104 %
FVC-%Pred-Pre: 105 %
FVC-Post: 4.47 L
Post FEV6/FVC ratio: 100 %
Pre FEV1/FVC ratio: 83 %
Pre FEV6/FVC Ratio: 100 %
RV % pred: 127 %
RV: 2.93 L
TLC % pred: 109 %
TLC: 7.35 L

## 2022-10-25 ENCOUNTER — Other Ambulatory Visit: Payer: Self-pay | Admitting: Internal Medicine

## 2022-10-25 ENCOUNTER — Ambulatory Visit: Payer: BC Managed Care – PPO | Admitting: Orthopaedic Surgery

## 2022-10-25 DIAGNOSIS — R7309 Other abnormal glucose: Secondary | ICD-10-CM

## 2022-10-26 ENCOUNTER — Other Ambulatory Visit: Payer: Self-pay | Admitting: Internal Medicine

## 2022-10-26 ENCOUNTER — Other Ambulatory Visit (HOSPITAL_COMMUNITY): Payer: Self-pay

## 2022-10-26 ENCOUNTER — Ambulatory Visit: Payer: BC Managed Care – PPO | Admitting: Emergency Medicine

## 2022-10-26 ENCOUNTER — Telehealth: Payer: Self-pay | Admitting: Internal Medicine

## 2022-10-26 MED ORDER — LOVASTATIN 40 MG PO TABS: ORAL_TABLET | ORAL | 2 refills | Status: DC

## 2022-10-26 NOTE — Telephone Encounter (Signed)
Pet test claims PA is not needed. Refill is too soon until 08/15 due to 90 day fill on 06/09

## 2022-10-26 NOTE — Telephone Encounter (Signed)
Prescription Request  10/26/2022  LOV: 06/21/2022   What is the name of the medication or equipment?  lovastatin (MEVACOR) 40 MG tablet   Pt was requesting for a refill for this medication but I don't see it on his med list pioglitazone  Have you contacted your pharmacy to request a refill? No   Which pharmacy would you like this sent to?  [id number] [place], [place] - [address] [place] [address] Phone: [phone] Fax: [phone]    Patient notified that their request is being sent to the clinical staff for review and that they should receive a response within 2 business days.   Please advise at Mobile [phone] (mobile)

## 2022-10-26 NOTE — Telephone Encounter (Signed)
[facility] 

## 2022-10-27 ENCOUNTER — Encounter: Payer: Self-pay | Admitting: Internal Medicine

## 2022-10-28 ENCOUNTER — Other Ambulatory Visit: Payer: Self-pay | Admitting: Internal Medicine

## 2022-10-28 MED ORDER — PIOGLITAZONE HCL 15 MG PO TABS: 15.0000 mg | ORAL_TABLET | Freq: Every day | ORAL | 3 refills | Status: DC

## 2022-11-11 ENCOUNTER — Encounter (INDEPENDENT_AMBULATORY_CARE_PROVIDER_SITE_OTHER): Payer: Self-pay

## 2022-11-22 ENCOUNTER — Telehealth: Payer: Self-pay | Admitting: Neurology

## 2022-11-22 NOTE — Telephone Encounter (Signed)
1. Which medications need to be refilled? (please list name of each medication and dose if known) NURTEC  2. Which pharmacy/location (including street and city if local pharmacy) is medication to be sent to? [facility] stated he needs refill of NURTEC. Pharmacy told pt to reach out to [name]

## 2022-11-23 ENCOUNTER — Ambulatory Visit (INDEPENDENT_AMBULATORY_CARE_PROVIDER_SITE_OTHER): Payer: BC Managed Care – PPO | Admitting: Emergency Medicine

## 2022-11-23 ENCOUNTER — Encounter: Payer: Self-pay | Admitting: Emergency Medicine

## 2022-11-23 VITALS — BP 130/74 | HR 74 | Temp 98.1°F | Ht 68.5 in | Wt 255.0 lb

## 2022-11-23 DIAGNOSIS — R0602 Shortness of breath: Secondary | ICD-10-CM | POA: Insufficient documentation

## 2022-11-23 DIAGNOSIS — R918 Other nonspecific abnormal finding of lung field: Secondary | ICD-10-CM | POA: Insufficient documentation

## 2022-11-23 DIAGNOSIS — Z2839 Other underimmunization status: Secondary | ICD-10-CM | POA: Insufficient documentation

## 2022-11-23 DIAGNOSIS — G4733 Obstructive sleep apnea (adult) (pediatric): Secondary | ICD-10-CM | POA: Diagnosis not present

## 2022-11-23 DIAGNOSIS — Z23 Encounter for immunization: Secondary | ICD-10-CM

## 2022-11-23 MED ORDER — NURTEC 75 MG PO TBDP: ORAL_TABLET | ORAL | 0 refills | Status: DC

## 2022-11-23 NOTE — Telephone Encounter (Signed)
Called patient and informed him that per Dr. Allena Katz she has ok 'd me to send 10 tablets no refills for patient but he needs a f/u appointment since it has been 2 years. I also informed patient that he can also request this from his PCP. Patient stated he would ike to make an appointment with Dr. Allena Katz. He will cal the front to get scheduled and is aware that we will send in a 10 tablet supply and he needs an appointment. No future refills until he is seen in office. Patient verbalized understanding.

## 2022-11-23 NOTE — Progress Notes (Signed)
Subjective:    Patient ID: Ruben West, male    DOB: Mar 03, 1956, 67 y.o.   MRN: [id number]  HPI 67 year old former smoker (cigars, for 1-2 yrs) with history of OSA on CPAP, hypertension, Nash, hyperlipidemia, GERD, chronic rhinitis.  He saw Dr. Thora Lance in our office on 08/04/2022 for an abnormality found on a cardiac calcium scoring CT chest done 07/26/2022.  This showed a mixed density left lower lobe 2.8 x 2.2 cm nodule that was new compared with January 2024 abdominal CT.  There was also a subsolid left upper lobe nodular density with a 5 mm solid component.  Plan made to repeat his CT as below.  Today he reports that he is feeling well. No breathing limitations. He is exercising and working on losing wt. Has has some cough in the am - feels dry from his CPAP. Clears some white mucous. Great CPAP compliance - gets his supplies without difficulty.   CT scan of the chest 09/29/2022 reviewed by me, shows full resolution of the previously identified mixed density left lower lobe and left upper lobe nodules.  There is a stable 5 mm subpleural solid nodule in the left lower lobe.  Pulmonary function testing done on 10/18/2022 and reviewed by me shows normal airflows with out of bronchodilator response, normal lung volumes, normal diffusion capacity.  Flow volume loop is normal in appearance.   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   PUD (peptic ulcer disease)    Sleep apnea    on CPAP     Family History  Problem Relation Age of Onset   Heart attack Mother        MI, deceased 30   Seizures Mother    Emphysema  Father        Deceased, 62   Healthy Daughter        x2   Seizures Grandchild    Cancer Neg Hx        no cancer in immediate family   Colon cancer Neg Hx    Esophageal cancer Neg Hx    Rectal cancer Neg Hx    Stomach cancer Neg Hx    Colon polyps Neg Hx   Positive for lung cancer in his mother and also his aunt  Social History   Socioeconomic History   Marital status: Married    Spouse name: Millie   Number of children: 2   Years of education: 11 grade   Highest education level: Not on file  Occupational History   Occupation: Scientist, water quality: [employer]  Tobacco Use   Smoking status: Former    Types: Cigars   Smokeless tobacco: Never   Tobacco comments:    Quit smoking a year ago. PAP 08/04/22  Vaping Use   Vaping status: Never Used  Substance and Sexual Activity   Alcohol use: Not Currently    Alcohol/week: 1.0 standard drink of alcohol    Types: 1 Cans of beer per week   Drug use: No   Sexual activity: Yes  Other Topics Concern  Not on file  Social History Narrative   Lives with wife in a one story home.  Has 2 grandchildren living with him.     Works as a Naval architect.     Education: 10th grade.        Right hand    Social Determinants of Health   Financial Resource Strain: Not on file  Food Insecurity: No Food Insecurity (06/20/2022)   Hunger Vital Sign    Worried About Running Out of Food in the Last Year: Never true    Ran Out of Food in the Last Year: Never true  Transportation Needs: No Transportation Needs (06/20/2022)   PRAPARE - Administrator, Civil Service (Medical): No    Lack of Transportation (Non-Medical): No  Physical Activity: Insufficiently Active (06/20/2022)   Exercise Vital Sign    Days of Exercise per Week: 2 days    Minutes of Exercise per Session: 10 min  Stress: No Stress Concern Present (06/20/2022)   [employer] of Occupational Health - Occupational Stress Questionnaire    Feeling of Stress : Not at all   Social Connections: Moderately Isolated (06/20/2022)   Social Connection and Isolation Panel [NHANES]    Frequency of Communication with Friends and Family: More than three times a week    Frequency of Social Gatherings with Friends and Family: Once a week    Attends Religious Services: Never    Database administrator or Organizations: No    Attends Engineer, structural: Not on file    Marital Status: Married  Intimate Partner Violence: Unknown (07/01/2021)   Received from [employer], [facility]   HITS    Physically Hurt: Not on file    Insult or Talk Down To: Not on file    Threaten Physical Harm: Not on file    Scream or Curse: Not on file     Allergies  Allergen Reactions   Gabapentin Nausea And Vomiting     Outpatient Medications Prior to Visit  Medication Sig Dispense Refill   aspirin EC 81 MG tablet Take 81 mg by mouth every morning.     Cholecalciferol (VITAMIN D-3) 25 MCG (1000 UT) CAPS Take 1 capsule by mouth daily.     eletriptan (RELPAX) 40 MG tablet Take by mouth.     ibuprofen (ADVIL) 800 MG tablet Take 800 mg by mouth every 6 (six) hours as needed for mild pain or moderate pain.     losartan-hydrochlorothiazide (HYZAAR) 100-12.5 MG tablet Take 1 tablet by mouth daily. 90 tablet 3   lovastatin (MEVACOR) 40 MG tablet TAKE 1 TABLET BY MOUTH EVERY DAY 90 tablet 2   ondansetron (ZOFRAN ODT) 4 MG disintegrating tablet Take 1 tablet (4 mg total) by mouth every 8 (eight) hours as needed for nausea or vomiting. 20 tablet 1   pioglitazone (ACTOS) 15 MG tablet TAKE 1 TABLET (15 MG TOTAL) BY MOUTH DAILY. 90 tablet 2   POTASSIUM PO Take by mouth.     Rimegepant Sulfate (NURTEC) 75 MG TBDP TAKE 1 TABLET BY MOUTH AS NEEDED FOR MIGRAINE FIRST 15 MIN 10 tablet 0   zolmitriptan (ZOMIG-ZMT) 5 MG disintegrating tablet DISSOLVE 1 TABLET ON TONGUE AS NEEDED FOR MIGRAINE 10 tablet 5   No facility-administered medications prior to visit.        Objective:   Physical  Exam  Vitals:   11/23/22 1304  BP: 130/74  Pulse: 74  Temp: 98.1 F (36.7 C)  TempSrc: Oral  SpO2: 96%  Weight: 255 lb (115.7 kg)  Height: 5' 8.5" (1.74 m)    Gen: Pleasant, overweight man made to get operated again, in no distress,  normal affect  ENT: No lesions,  mouth clear,  oropharynx clear, no postnasal drip  Neck: No JVD, no stridor  Lungs: No use of accessory muscles, no crackles or wheezing on normal respiration, no wheeze on forced expiration  Cardiovascular: RRR, heart sounds normal, no murmur or gallops, no peripheral edem  Musculoskeletal: No deformities, no cyanosis or clubbing  Neuro: alert, awake, non focal  Skin: Warm, no lesions or rash      Assessment & Plan:   Shortness of breath With a reassuring cardiac evaluation and now normal pulmonary function testing and CT chest.  Suspect that his dyspnea is due to deconditioning.  He states that is already getting better with his exercise routine.  Plan to continue same.  No indication for BD therapy at this time.  Pulmonary nodules Previously identified mixed density pulmonary nodules seen on his cardiac calcium CT chest from May 2024 have resolved.  He has residual subpleural 5 mm nodule that will need to be followed since he has moderate risk (due to family history).  We will repeat his CT chest in July 2024 and then follow-up to review for stability.  OSA (obstructive sleep apnea) Confirmed good compliance and good clinical benefit from his CPAP machine.  He gets his equipment without difficulty.  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  Plan to continue same  Immunizations incomplete Patient requests his flu shot today.  Plan to give to him  Levy Pupa, MD, PhD 11/23/2022, 1:23 PM [facility] [phone] or if no answer before 7:00PM call [phone] For any issues after 7:00PM please call eLink [phone]

## 2022-11-23 NOTE — Patient Instructions (Addendum)
We reviewed your CT scan of the chest today. We will plan to repeat your CT chest in July 2025 to compare with priors. We reviewed your pulmonary function testing today.  These are reassuring without any evidence of abnormal airflow.  Good news. Please continue your current exercise routine to work on your weight loss and conditioning. Continue your CPAP every night as you have been using it. Please follow Dr. Delton Coombes in July 2025 to review your CT scan, call sooner if you have any problems or changes in your breathing.

## 2022-11-23 NOTE — Assessment & Plan Note (Signed)
Patient requests his flu shot today.  Plan to give to him

## 2022-11-23 NOTE — Assessment & Plan Note (Signed)
With a reassuring cardiac evaluation and now normal pulmonary function testing and CT chest.  Suspect that his dyspnea is due to deconditioning.  He states that is already getting better with his exercise routine.  Plan to continue same.  No indication for BD therapy at this time.

## 2022-11-23 NOTE — Assessment & Plan Note (Signed)
Previously identified mixed density pulmonary nodules seen on his cardiac calcium CT chest from May 2024 have resolved.  He has residual subpleural 5 mm nodule that will need to be followed since he has moderate risk (due to family history).  We will repeat his CT chest in July 2024 and then follow-up to review for stability.

## 2022-11-23 NOTE — Assessment & Plan Note (Signed)
Confirmed good compliance and good clinical benefit from his CPAP machine.  He gets his equipment without difficulty.  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  Plan to continue same

## 2022-12-20 ENCOUNTER — Ambulatory Visit: Payer: BC Managed Care – PPO | Admitting: Internal Medicine

## 2022-12-20 ENCOUNTER — Encounter: Payer: Self-pay | Admitting: Internal Medicine

## 2022-12-20 VITALS — BP 126/70 | HR 78 | Temp 98.2°F | Ht 68.5 in | Wt 257.0 lb

## 2022-12-20 DIAGNOSIS — E7800 Pure hypercholesterolemia, unspecified: Secondary | ICD-10-CM

## 2022-12-20 DIAGNOSIS — M545 Low back pain, unspecified: Secondary | ICD-10-CM

## 2022-12-20 DIAGNOSIS — R7309 Other abnormal glucose: Secondary | ICD-10-CM | POA: Diagnosis not present

## 2022-12-20 DIAGNOSIS — N1832 Chronic kidney disease, stage 3b: Secondary | ICD-10-CM

## 2022-12-20 DIAGNOSIS — E559 Vitamin D deficiency, unspecified: Secondary | ICD-10-CM

## 2022-12-20 DIAGNOSIS — G8929 Other chronic pain: Secondary | ICD-10-CM

## 2022-12-20 DIAGNOSIS — I10 Essential (primary) hypertension: Secondary | ICD-10-CM | POA: Diagnosis not present

## 2022-12-20 LAB — LIPID PANEL
Cholesterol: 148 mg/dL (ref 0–200)
HDL: 54.7 mg/dL (ref >39.00)
LDL Cholesterol: 77 mg/dL (ref 0–99)
NonHDL: 93.51
Total CHOL/HDL Ratio: 3
Triglycerides: 81 mg/dL (ref 0.0–149.0)
VLDL: 16.2 mg/dL (ref 0.0–40.0)

## 2022-12-20 LAB — HEPATIC FUNCTION PANEL
ALT: 36 U/L (ref 0–53)
AST: 19 U/L (ref 0–37)
Albumin: 4 g/dL (ref 3.5–5.2)
Alkaline Phosphatase: 60 U/L (ref 39–117)
Bilirubin, Direct: 0.1 mg/dL (ref 0.0–0.3)
Total Bilirubin: 0.6 mg/dL (ref 0.2–1.2)
Total Protein: 6.8 g/dL (ref 6.0–8.3)

## 2022-12-20 LAB — BASIC METABOLIC PANEL
BUN: 21 mg/dL (ref 6–23)
CO2: 23 mEq/L (ref 19–32)
Calcium: 9.1 mg/dL (ref 8.4–10.5)
Chloride: 105 mEq/L (ref 96–112)
Creatinine, Ser: 1.03 mg/dL (ref 0.40–1.50)
GFR: 75.28 mL/min (ref >60.00)
Glucose, Bld: 167 mg/dL — ABNORMAL HIGH (ref 70–99)
Potassium: 3.6 mEq/L (ref 3.5–5.1)
Sodium: 138 mEq/L (ref 135–145)

## 2022-12-20 LAB — HEMOGLOBIN A1C: Hgb A1c MFr Bld: 5.2 % (ref 4.6–6.5)

## 2022-12-20 LAB — VITAMIN D 25 HYDROXY (VIT D DEFICIENCY, FRACTURES): VITD: 27.79 ng/mL — ABNORMAL LOW (ref 30.00–100.00)

## 2022-12-20 NOTE — Assessment & Plan Note (Signed)
Aggrevated s/p fall April 2024 8 ft off end of a truck - improved, cont PT,  to f/u any worsening symptoms or concerns

## 2022-12-20 NOTE — Progress Notes (Unsigned)
Patient ID: Ruben West, male   DOB: 1955/09/22, 67 y.o.   MRN: [id number]        Chief Complaint: follow up HTN, HLD and hyperglycemia , low vit d, ckd3a, chronic lbp       HPI:  Ruben West is a 67 y.o. male here overall doing ok.  Pt denies chest pain, increased sob or doe, wheezing, orthopnea, PND, increased LE swelling, palpitations, dizziness or syncope.   Pt denies polydipsia, polyuria, or new focal neuro s/s.    Pt denies fever, wt loss, night sweats, loss of appetite, or other constitutional symptoms  Also -  Larey Seat off back of truck april 2024 after last seen here, initialy thought to have fracture at [facility] but rx with muscle relaxer and saw ortho, has mild LS spine DJD, and to start PT later today.    Tolerating increased dose statin after CT cardiac score 671 on testing April 2024.  Also incidentally with abnormal lung apperance on that scan, but f/u with Ct and pulm aug 2024 showed likely benign, for f/u July 2025 with pulm for CT repeat,  No longer smoking.  Never started mounjaro.    POET stress testing negative for ischemia July 2024.   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 His smoking use included cigars. He has never used smokeless tobacco. He reports that he does not currently use alcohol after a past usage of about 1.0 standard drink of alcohol per week. He reports that he does not use drugs. family history includes Emphysema in his father; Healthy in his daughter; Heart attack in his mother; Seizures in his grandchild and mother. Allergies  Allergen Reactions   Gabapentin Nausea And Vomiting   Current Outpatient Medications on File Prior to Visit  Medication Sig Dispense Refill   aspirin EC 81 MG tablet Take 81 mg by mouth every morning.     Cholecalciferol (VITAMIN D-3) 25 MCG (1000 UT) CAPS Take 1 capsule by mouth daily.     eletriptan (RELPAX) 40 MG tablet Take by mouth.     ibuprofen (ADVIL) 800 MG tablet Take 800 mg by mouth every 6 (six) hours as needed for mild pain or moderate pain.     losartan-hydrochlorothiazide (HYZAAR) 100-12.5 MG tablet Take 1 tablet by mouth daily. 90  tablet 3   lovastatin (MEVACOR) 40 MG tablet TAKE 1 TABLET BY MOUTH EVERY DAY 90 tablet 2   ondansetron (ZOFRAN ODT) 4 MG disintegrating tablet Take 1 tablet (4 mg total) by mouth every 8 (eight) hours as needed for nausea or vomiting. 20 tablet 1   pioglitazone (ACTOS) 15 MG tablet TAKE 1 TABLET (15 MG TOTAL) BY MOUTH DAILY. 90 tablet 2   POTASSIUM PO Take by mouth.     Rimegepant Sulfate (NURTEC) 75 MG TBDP TAKE 1 TABLET BY MOUTH AS NEEDED FOR MIGRAINE FIRST 15 MIN 10 tablet 0   zolmitriptan (ZOMIG-ZMT) 5 MG disintegrating tablet DISSOLVE 1 TABLET ON TONGUE AS NEEDED FOR MIGRAINE 10 tablet 5   No current facility-administered medications on file prior to visit.        ROS:  All others reviewed and negative.  Objective        PE:  BP  126/70 (BP Location: Right Arm, Patient Position: Sitting, Cuff Size: Normal)   Pulse 78   Temp 98.2 F (36.8 C) (Oral)   Ht 5' 8.5" (1.74 m)   Wt 257 lb (116.6 kg)   SpO2 96%   BMI 38.51 kg/m                 Constitutional: Pt appears in NAD               HENT: Head: NCAT.                Right Ear: External ear normal.                 Left Ear: External ear normal.                Eyes: . Pupils are equal, round, and reactive to light. Conjunctivae and EOM are normal               Nose: without d/c or deformity               Neck: Neck supple. Gross normal ROM               Cardiovascular: Normal rate and regular rhythm.                 Pulmonary/Chest: Effort normal and breath sounds without rales or wheezing.                Abd:  Soft, NT, ND, + BS, no organomegaly               Neurological: Pt is alert. At baseline orientation, motor grossly intact               Skin: Skin is warm. No rashes, no other new lesions, LE edema - none               Psychiatric: Pt behavior is normal without agitation   Micro: none  Cardiac tracings I have personally interpreted today:  none  Pertinent Radiological findings (summarize): none   Lab Results  Component Value Date   WBC 7.1 06/21/2022   HGB 15.5 06/21/2022   HCT 44.3 06/21/2022   PLT 185.0 06/21/2022   GLUCOSE 167 (H) 12/20/2022   CHOL 148 12/20/2022   TRIG 81.0 12/20/2022   HDL 54.70 12/20/2022   LDLDIRECT 48.0 06/21/2022   LDLCALC 77 12/20/2022   ALT 36 12/20/2022   AST 19 12/20/2022   NA 138 12/20/2022   K 3.6 12/20/2022   CL 105 12/20/2022  CREATININE 1.03 12/20/2022   BUN 21 12/20/2022   CO2 23 12/20/2022   TSH 2.02 06/21/2022   PSA 0.36 06/21/2022   HGBA1C 5.2 12/20/2022   MICROALBUR <0.7 06/21/2022   Assessment/Plan:  Ruben West is a 67 y.o. White or Caucasian [1] male with  has a past medical history of ALLERGIC RHINITIS (11/23/2006), Back pain, ERECTILE DYSFUNCTION, ORGANIC (04/04/2007), FATTY LIVER  DISEASE (05/09/2008), GERD (04/04/2007), History of kidney stones, Hypercholesteremia, HYPERGLYCEMIA (04/04/2007), HYPERTENSION (11/23/2006), Migraines, NASH (nonalcoholic steatohepatitis), Other testicular hypofunction (05/19/2007), PUD (peptic ulcer disease), and Sleep apnea.  Low back pain Aggrevated s/p fall April 2024 8 ft off end of a truck - improved, cont PT,  to f/u any worsening symptoms or concerns   Essential hypertension BP Readings from Last 3 Encounters:  12/20/22 126/70  11/23/22 130/74  09/03/22 122/72   Stable, pt to continue medical treatment hyzaar 100 - 12.5 qd   Hyperlipidemia Lab Results  Component Value Date   LDLCALC 77 12/20/2022   Uncontrolled, goal ldl < 70, pt to continue current statin lovastatin 40 every day, and lower chol diet, declines other change for now   Stage 3b chronic kidney disease (CKD) (HCC) Lab Results  Component Value Date   CREATININE 1.03 12/20/2022   Stable overall, cont to avoid nephrotoxins   Vitamin D deficiency Last vitamin D Lab Results  Component Value Date   VD25OH 27.79 (L) 12/20/2022   Low, to start oral replacement  Followup: Return in about 6 months (around 06/19/2023).  Oliver Barre, MD 12/23/2022 9:00 PM [facility] - [facility] Internal Medicine

## 2022-12-20 NOTE — Patient Instructions (Addendum)
Please have your Shingrix (shingles) shots done at your local pharmacy.  Please continue all other medications as before, and refills have been done if requested.  Please have the pharmacy call with any other refills you may need.  Please continue your efforts at being more active, low cholesterol diet, and weight control..  Please keep your appointments with your specialists as you may have planned  Please go to the LAB at the blood drawing area for the tests to be done  You will be contacted by phone if any changes need to be made immediately.  Otherwise, you will receive a letter about your results with an explanation, but please check with MyChart first.  Please make an Appointment to return in 6 months, or sooner if needed, also with Lab Appointment for testing done 3-5 days before at the FIRST FLOOR Lab (so this is for TWO appointments - please see the scheduling desk as you leave)

## 2022-12-20 NOTE — Progress Notes (Signed)
The test results show that your current treatment is OK, as the tests are stable.  Please continue the same plan.  There is no other need for change of treatment or further evaluation based on these results, at this time.  thanks 

## 2022-12-23 ENCOUNTER — Encounter: Payer: Self-pay | Admitting: Internal Medicine

## 2022-12-23 NOTE — Assessment & Plan Note (Signed)
BP Readings from Last 3 Encounters:  12/20/22 126/70  11/23/22 130/74  09/03/22 122/72   Stable, pt to continue medical treatment hyzaar 100 - 12.5 qd

## 2022-12-23 NOTE — Assessment & Plan Note (Signed)
Lab Results  Component Value Date   LDLCALC 77 12/20/2022   Uncontrolled, goal ldl < 70, pt to continue current statin lovastatin 40 every day, and lower chol diet, declines other change for now

## 2022-12-23 NOTE — Assessment & Plan Note (Signed)
Last vitamin D Lab Results  Component Value Date   VD25OH 27.79 (L) 12/20/2022   Low, to start oral replacement

## 2022-12-23 NOTE — Assessment & Plan Note (Signed)
Lab Results  Component Value Date   CREATININE 1.03 12/20/2022   Stable overall, cont to avoid nephrotoxins

## 2023-02-22 ENCOUNTER — Encounter: Payer: Self-pay | Admitting: Neurology

## 2023-02-22 ENCOUNTER — Ambulatory Visit (INDEPENDENT_AMBULATORY_CARE_PROVIDER_SITE_OTHER): Payer: BC Managed Care – PPO | Admitting: Neurology

## 2023-02-22 VITALS — BP 142/86 | HR 62 | Ht 68.5 in | Wt 266.0 lb

## 2023-02-22 DIAGNOSIS — G43909 Migraine, unspecified, not intractable, without status migrainosus: Secondary | ICD-10-CM | POA: Diagnosis not present

## 2023-02-22 MED ORDER — NURTEC 75 MG PO TBDP: ORAL_TABLET | ORAL | 3 refills | Status: DC

## 2023-02-22 MED ORDER — ONDANSETRON 4 MG PO TBDP: 4.0000 mg | ORAL_TABLET | Freq: Three times a day (TID) | ORAL | 1 refills | Status: AC | PRN

## 2023-02-22 NOTE — Progress Notes (Signed)
Follow-up Visit   Date: 02/22/23   YOGI COLLA MRN: [id number] DOB: November 12, 1955   Interim History: Ruben West is a 67 y.o. right-handed Caucasian male with hyperlipidemia and hypertension returning to the clinic for follow-up of migraines.  The patient was accompanied to the clinic by self.  IMPRESSION/PLAN: Episodic migraine without aura, well-controlled with headaches occurring once every 3 months.  He has been on zomig in the past which also helped  - Continue Nurtec 75mg  as needed   - Refill for zofran 4mg  given as needed for severe migraine  Post-concussion headaches, resolved  Return to clinic in 1 year  ----------------------------------- UPDATE 02/22/2023:  He reports migraines are under good control.  He gets a migraine once every 3 months.  Most of the time, Nurtec provides good relief, especially if he catches it within the first 30-minutes.  If he does not take Nurtec early enough, then the migraine will last about a day and he has associated nausea/vomiting.    Medications:  Current Outpatient Medications on File Prior to Visit  Medication Sig Dispense Refill   aspirin EC 81 MG tablet Take 81 mg by mouth every morning.     Cholecalciferol (VITAMIN D-3) 25 MCG (1000 UT) CAPS Take 1 capsule by mouth daily.     eletriptan (RELPAX) 40 MG tablet Take by mouth.     ibuprofen (ADVIL) 800 MG tablet Take 800 mg by mouth every 6 (six) hours as needed for mild pain or moderate pain.     losartan-hydrochlorothiazide (HYZAAR) 100-12.5 MG tablet Take 1 tablet by mouth daily. 90 tablet 3   lovastatin (MEVACOR) 40 MG tablet TAKE 1 TABLET BY MOUTH EVERY DAY 90 tablet 2   ondansetron (ZOFRAN ODT) 4 MG disintegrating tablet Take 1 tablet (4 mg total) by mouth every 8 (eight) hours as needed for nausea or vomiting. 20 tablet 1   pioglitazone (ACTOS) 15 MG tablet TAKE 1 TABLET (15 MG TOTAL) BY MOUTH DAILY. 90 tablet 2   POTASSIUM PO Take by mouth.     Rimegepant Sulfate  (NURTEC) 75 MG TBDP TAKE 1 TABLET BY MOUTH AS NEEDED FOR MIGRAINE FIRST 15 MIN 10 tablet 0   zolmitriptan (ZOMIG-ZMT) 5 MG disintegrating tablet DISSOLVE 1 TABLET ON TONGUE AS NEEDED FOR MIGRAINE 10 tablet 5   No current facility-administered medications on file prior to visit.    Allergies:  Allergies  Allergen Reactions   Gabapentin Nausea And Vomiting    Vital Signs:  BP (!) 142/86   Pulse 62   Ht 5' 8.5" (1.74 m)   Wt 266 lb (120.7 kg)   SpO2 93%   BMI 39.86 kg/m   Neurological Exam: MENTAL STATUS including orientation to time, place, person, recent and remote memory, attention span and concentration, language, and fund of knowledge is normal.  Speech is not dysarthric.  CRANIAL NERVES: Pupils equal round and reactive to light.  Normal conjugate, extra-ocular eye movements in all directions of gaze.  No ptosis.    MOTOR:  Motor strength is 5/5 in all extremities.  No atrophy, fasciculations or abnormal movements.  No pronator drift.  Tone is normal.    COORDINATION/GAIT:  Gait narrow based and stable. Tandem gait intact.   Data:  CT head and cervical spine 04/15/2020:  No acute intracranial or cervical spine abnormalities.      Thank you for allowing me to participate in patient's care.  If I can answer any additional questions, I would be pleased to  do so.    Sincerely,    Relena Ivancic K. Allena Katz, DO

## 2023-03-09 ENCOUNTER — Telehealth: Payer: Self-pay | Admitting: Neurology

## 2023-03-09 NOTE — Telephone Encounter (Signed)
Pt stated pharmacy needs PA to refill Nurtec script

## 2023-03-16 ENCOUNTER — Telehealth: Payer: Self-pay | Admitting: Pharmacy Technician

## 2023-03-16 ENCOUNTER — Other Ambulatory Visit (HOSPITAL_COMMUNITY): Payer: Self-pay

## 2023-03-16 NOTE — Telephone Encounter (Signed)
PA has been submitted, and telephone encounter has been created. 

## 2023-03-16 NOTE — Telephone Encounter (Signed)
Pharmacy Patient Advocate Encounter   Received notification from Pt Calls Messages that prior authorization for NURTEC 75MG  is required/requested.   Insurance verification completed.   The patient is insured through [employer] .   Per test claim: PA required; PA submitted to above mentioned insurance via CoverMyMeds Key/confirmation #/EOC [id number] Status is pending

## 2023-04-11 ENCOUNTER — Ambulatory Visit: Payer: BC Managed Care – PPO | Admitting: Neurology

## 2023-05-26 ENCOUNTER — Other Ambulatory Visit: Payer: Self-pay

## 2023-05-26 ENCOUNTER — Other Ambulatory Visit: Payer: Self-pay | Admitting: Internal Medicine

## 2023-06-01 ENCOUNTER — Other Ambulatory Visit: Payer: Self-pay | Admitting: Internal Medicine

## 2023-06-01 ENCOUNTER — Ambulatory Visit: Payer: BC Managed Care – PPO | Admitting: Internal Medicine

## 2023-06-01 ENCOUNTER — Encounter: Payer: Self-pay | Admitting: Internal Medicine

## 2023-06-01 VITALS — BP 122/80 | HR 70 | Temp 98.2°F | Ht 68.5 in | Wt 267.0 lb

## 2023-06-01 DIAGNOSIS — R7309 Other abnormal glucose: Secondary | ICD-10-CM | POA: Diagnosis not present

## 2023-06-01 DIAGNOSIS — E669 Obesity, unspecified: Secondary | ICD-10-CM

## 2023-06-01 DIAGNOSIS — Z0001 Encounter for general adult medical examination with abnormal findings: Secondary | ICD-10-CM | POA: Diagnosis not present

## 2023-06-01 DIAGNOSIS — I10 Essential (primary) hypertension: Secondary | ICD-10-CM | POA: Diagnosis not present

## 2023-06-01 DIAGNOSIS — E538 Deficiency of other specified B group vitamins: Secondary | ICD-10-CM | POA: Diagnosis not present

## 2023-06-01 DIAGNOSIS — E7800 Pure hypercholesterolemia, unspecified: Secondary | ICD-10-CM

## 2023-06-01 DIAGNOSIS — E559 Vitamin D deficiency, unspecified: Secondary | ICD-10-CM | POA: Diagnosis not present

## 2023-06-01 DIAGNOSIS — Z125 Encounter for screening for malignant neoplasm of prostate: Secondary | ICD-10-CM

## 2023-06-01 DIAGNOSIS — N1832 Chronic kidney disease, stage 3b: Secondary | ICD-10-CM

## 2023-06-01 LAB — VITAMIN B12: Vitamin B-12: 457 pg/mL (ref 211–911)

## 2023-06-01 LAB — CBC WITH DIFFERENTIAL/PLATELET
Basophils Absolute: 0.1 10*3/uL (ref 0.0–0.1)
Basophils Relative: 0.8 % (ref 0.0–3.0)
Eosinophils Absolute: 0.1 10*3/uL (ref 0.0–0.7)
Eosinophils Relative: 1.4 % (ref 0.0–5.0)
HCT: 48 % (ref 39.0–52.0)
Hemoglobin: 16.2 g/dL (ref 13.0–17.0)
Lymphocytes Relative: 34.6 % (ref 12.0–46.0)
Lymphs Abs: 2.5 10*3/uL (ref 0.7–4.0)
MCHC: 33.8 g/dL (ref 30.0–36.0)
MCV: 89.7 fl (ref 78.0–100.0)
Monocytes Absolute: 0.7 10*3/uL (ref 0.1–1.0)
Monocytes Relative: 10 % (ref 3.0–12.0)
Neutro Abs: 3.9 10*3/uL (ref 1.4–7.7)
Neutrophils Relative %: 53.2 % (ref 43.0–77.0)
Platelets: 176 10*3/uL (ref 150.0–400.0)
RBC: 5.35 Mil/uL (ref 4.22–5.81)
RDW: 13.4 % (ref 11.5–15.5)
WBC: 7.3 10*3/uL (ref 4.0–10.5)

## 2023-06-01 LAB — MICROALBUMIN / CREATININE URINE RATIO
Creatinine,U: 149.6 mg/dL
Microalb Creat Ratio: 4.7 mg/g (ref 0.0–30.0)
Microalb, Ur: <0.7 mg/dL (ref 0.0–1.9)

## 2023-06-01 LAB — URINALYSIS, ROUTINE W REFLEX MICROSCOPIC
Bilirubin Urine: NEGATIVE
Hgb urine dipstick: NEGATIVE
Ketones, ur: NEGATIVE
Leukocytes,Ua: NEGATIVE
Nitrite: NEGATIVE
RBC / HPF: NONE SEEN (ref 0–?)
Specific Gravity, Urine: 1.025 (ref 1.000–1.030)
Total Protein, Urine: NEGATIVE
Urine Glucose: NEGATIVE
Urobilinogen, UA: 0.2 (ref 0.0–1.0)
pH: 6 (ref 5.0–8.0)

## 2023-06-01 LAB — HEPATIC FUNCTION PANEL
ALT: 32 U/L (ref 0–53)
AST: 20 U/L (ref 0–37)
Albumin: 4.2 g/dL (ref 3.5–5.2)
Alkaline Phosphatase: 63 U/L (ref 39–117)
Bilirubin, Direct: 0.2 mg/dL (ref 0.0–0.3)
Total Bilirubin: 0.7 mg/dL (ref 0.2–1.2)
Total Protein: 6.6 g/dL (ref 6.0–8.3)

## 2023-06-01 LAB — BASIC METABOLIC PANEL
BUN: 19 mg/dL (ref 6–23)
CO2: 31 meq/L (ref 19–32)
Calcium: 9.4 mg/dL (ref 8.4–10.5)
Chloride: 104 meq/L (ref 96–112)
Creatinine, Ser: 1.05 mg/dL (ref 0.40–1.50)
GFR: 73.33 mL/min (ref >60.00)
Glucose, Bld: 108 mg/dL — ABNORMAL HIGH (ref 70–99)
Potassium: 4.5 meq/L (ref 3.5–5.1)
Sodium: 141 meq/L (ref 135–145)

## 2023-06-01 LAB — LIPID PANEL
Cholesterol: 147 mg/dL (ref 0–200)
HDL: 47.3 mg/dL (ref >39.00)
LDL Cholesterol: 67 mg/dL (ref 0–99)
NonHDL: 99.98
Total CHOL/HDL Ratio: 3
Triglycerides: 164 mg/dL — ABNORMAL HIGH (ref 0.0–149.0)
VLDL: 32.8 mg/dL (ref 0.0–40.0)

## 2023-06-01 LAB — VITAMIN D 25 HYDROXY (VIT D DEFICIENCY, FRACTURES): VITD: 22.39 ng/mL — ABNORMAL LOW (ref 30.00–100.00)

## 2023-06-01 LAB — PSA: PSA: 0.35 ng/mL (ref 0.10–4.00)

## 2023-06-01 LAB — HEMOGLOBIN A1C: Hgb A1c MFr Bld: 5.6 % (ref 4.6–6.5)

## 2023-06-01 LAB — TSH: TSH: 3.16 u[IU]/mL (ref 0.35–5.50)

## 2023-06-01 MED ORDER — TIRZEPATIDE-WEIGHT MANAGEMENT 2.5 MG/0.5ML ~~LOC~~ SOLN: 2.5000 mg | SUBCUTANEOUS | 11 refills | Status: DC

## 2023-06-01 NOTE — Progress Notes (Signed)
 Patient ID: PACER DORN, male   DOB: [birth year], 68 y.o.   MRN: [id number]         Chief Complaint:: wellness exam and obesity, hyperglycemia, htn, hld, ckdb, low vit d       HPI:  Ruben West is a 68 y.o. male here for wellness exam; for shingrix at pharmacy o/w up to date                        Also Pt continues to have recurring LBP without change in severity, bowel or bladder change, fever, wt loss,  worsening LE pain/numbness/weakness, gait change or falls, but has missed work with flares recently, now currently better after left ESI recently  Pt denies chest pain, increased sob or doe, wheezing, orthopnea, PND, increased LE swelling, palpitations, dizziness or syncope.   Pt denies polydipsia, polyuria, or new focal neuro s/s.    Pt denies fever, wt loss, night sweats, loss of appetite, or other constitutional symptoms     Wt Readings from Last 3 Encounters:  06/01/23 267 lb (121.1 kg)  02/22/23 266 lb (120.7 kg)  12/20/22 257 lb (116.6 kg)   BP Readings from Last 3 Encounters:  06/01/23 122/80  02/22/23 (!) 142/86  12/20/22 126/70   Immunization History  Administered Date(s) Administered   Fluad Quad(high Dose 65+) 12/08/2020, 12/21/2021   Fluad Trivalent(High Dose 65+) 11/23/2022   Influenza Split 01/29/2011   Influenza, Seasonal, Injecte, Preservative Fre 01/10/2015   Influenza,inj,Quad PF,6+ Mos 02/13/2016, 01/27/2017   Influenza,inj,quad, With Preservative 01/27/2017   Influenza-Unspecified 01/29/2011, 01/10/2015, 02/13/2016, 01/27/2017   PFIZER(Purple Top)SARS-COV-2 Vaccination 11/04/2019, 11/25/2019   PNEUMOCOCCAL CONJUGATE-20 12/21/2021   Pneumococcal Polysaccharide-23 12/08/2020   Td 11/28/2002   Tdap 03/04/2014   Zoster, Live 03/04/2014   Health Maintenance Due  Topic Date Due   Medicare Annual Wellness (AWV)  Never done   Zoster Vaccines- Shingrix (1 of 2) 09/03/1974      Past Medical History:  Diagnosis Date   ALLERGIC RHINITIS 11/23/2006   Back  pain    ERECTILE DYSFUNCTION, 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   FATTY LIVER DISEASE 05/09/2008   GERD 04/04/2007   no per pt   History of kidney stones    Hypercholesteremia    HYPERGLYCEMIA 04/04/2007   HYPERTENSION 11/23/2006   Migraines    NASH (nonalcoholic steatohepatitis)    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 His smoking use included cigars. He has never used smokeless tobacco. He reports that he does not currently use alcohol after a past usage of about 1.0 standard drink of alcohol per week. He reports that he does not use drugs. family history includes Emphysema in his father; Healthy in his daughter; Heart attack in his mother; Seizures in his grandchild and mother. Allergies  Allergen Reactions   Gabapentin Nausea And Vomiting   Current Outpatient Medications on File Prior to Visit  Medication Sig Dispense Refill   aspirin EC 81 MG tablet Take 81  mg by mouth every morning.     Cholecalciferol (VITAMIN D-3) 25 MCG (1000 UT) CAPS Take 1 capsule by mouth daily.     diazepam (VALIUM) 10 MG tablet Take 10 mg by mouth as needed.     eletriptan (RELPAX) 40 MG tablet Take by mouth.     ibuprofen (ADVIL) 800 MG tablet Take 800 mg by mouth every 6 (six) hours as needed for mild pain or moderate pain.     losartan-hydrochlorothiazide (HYZAAR) 100-12.5 MG tablet TAKE 1 TABLET BY MOUTH EVERY DAY 90 tablet 3   lovastatin (MEVACOR) 40 MG tablet TAKE 1 TABLET BY MOUTH EVERY DAY 90 tablet 2   meloxicam (MOBIC) 7.5 MG tablet  Take 7.5-15 mg by mouth daily.     methocarbamol (ROBAXIN) 750 MG tablet Take 750 mg by mouth 3 (three) times daily.     ondansetron (ZOFRAN ODT) 4 MG disintegrating tablet Take 1 tablet (4 mg total) by mouth every 8 (eight) hours as needed for nausea or vomiting. 20 tablet 1   pioglitazone (ACTOS) 15 MG tablet TAKE 1 TABLET (15 MG TOTAL) BY MOUTH DAILY. 90 tablet 2   POTASSIUM PO Take by mouth.     Rimegepant Sulfate (NURTEC) 75 MG TBDP TAKE 1 TABLET BY MOUTH AS NEEDED FOR MIGRAINE FIRST 15 MIN 10 tablet 3   No current facility-administered medications on file prior to visit.        ROS:  All others reviewed and negative.  Objective        PE:  BP 122/80 (BP Location: Right Arm, Patient Position: Sitting, Cuff Size: Normal)   Pulse 70   Temp 98.2 F (36.8 C) (Oral)   Ht 5' 8.5" (1.74 m)   Wt 267 lb (121.1 kg)   SpO2 95%   BMI 40.01 kg/m                 Constitutional: Pt appears in NAD               HENT: Head: NCAT.                Right Ear: External ear normal.                 Left Ear: External ear normal.                Eyes: . Pupils are equal, round, and reactive to light. Conjunctivae and EOM are normal               Nose: without d/c or deformity               Neck: Neck supple. Gross normal ROM               Cardiovascular: Normal rate and regular rhythm.                 Pulmonary/Chest: Effort normal and breath sounds without rales or wheezing.                Abd:  Soft, NT, ND, + BS, no organomegaly               Neurological: Pt is alert. At baseline orientation, motor grossly intact               Skin: Skin is warm. No rashes, no other new lesions, LE edema - none               Psychiatric: Pt behavior is normal  without agitation   Micro: none  Cardiac tracings I have personally interpreted today:  none  Pertinent Radiological findings (summarize): none   Lab Results  Component Value Date   WBC 7.3 06/01/2023   HGB 16.2 06/01/2023   HCT 48.0 06/01/2023    PLT 176.0 06/01/2023   GLUCOSE 108 (H) 06/01/2023   CHOL 147 06/01/2023   TRIG 164.0 (H) 06/01/2023   HDL 47.30 06/01/2023   LDLDIRECT 48.0 06/21/2022   LDLCALC 67 06/01/2023   ALT 32 06/01/2023   AST 20 06/01/2023   NA 141 06/01/2023   K 4.5 06/01/2023   CL 104 06/01/2023   CREATININE 1.05 06/01/2023   BUN 19 06/01/2023   CO2 31 06/01/2023   TSH 3.16 06/01/2023   PSA 0.35 06/01/2023   HGBA1C 5.6 06/01/2023   MICROALBUR <0.7 06/01/2023   Assessment/Plan:  Ruben West is a 68 y.o. White or Caucasian [1] male with  has a past medical history of ALLERGIC RHINITIS (11/23/2006), Back pain, ERECTILE DYSFUNCTION, ORGANIC (04/04/2007), FATTY LIVER DISEASE (05/09/2008), GERD (04/04/2007), History of kidney stones, Hypercholesteremia, HYPERGLYCEMIA (04/04/2007), HYPERTENSION (11/23/2006), Migraines, NASH (nonalcoholic steatohepatitis), Other testicular hypofunction (05/19/2007), PUD (peptic ulcer disease), and Sleep apnea.  Encounter for well adult exam with abnormal findings Age and sex appropriate education and counseling updated with regular exercise and diet Referrals for preventative services - none needed Immunizations addressed - for shingrix at pharmacy Smoking counseling  - none needed Evidence for depression or other mood disorder - none significant Most recent labs reviewed. I have personally reviewed and have noted: 1) the patient's medical and social history 2) The patient's current medications and supplements 3) The patient's height, weight, and BMI have been recorded in the chart   Essential hypertension BP Readings from Last 3 Encounters:  06/01/23 122/80  02/22/23 (!) 142/86  12/20/22 126/70   Stable, pt to continue medical treatment hyzaar 100 12.5 qd   Hyperlipidemia Lab Results  Component Value Date   LDLCALC 67 06/01/2023   Stable, pt to continue current statin lovastatin 40 qd   Stage 3b chronic kidney disease (CKD) (HCC) Lab Results  Component  Value Date   CREATININE 1.05 06/01/2023   Stable overall, cont to avoid nephrotoxins   Vitamin D deficiency Last vitamin D Lab Results  Component Value Date   VD25OH 22.39 (L) 06/01/2023   Low, to start oral replacement   Obesity (BMI 30-39.9) Uncontrolled, pt to start zepbound 2.5 mg weekly with intent to titrate  Followup: Return in about 1 year (around 05/31/2024).  Oliver Barre, MD 06/05/2023 11:00 AM [facility] [place] [facility] - [facility] Internal Medicine

## 2023-06-01 NOTE — Patient Instructions (Signed)
 Please have your Shingrix (shingles) shots done at your local pharmacy.  Please take all new medication as prescribed - the zepbound if covered by your insurance  Please continue all other medications as before, and refills have been done if requested.  Please have the pharmacy call with any other refills you may need.  Please continue your efforts at being more active, low cholesterol diet, and weight control.  You are otherwise up to date with prevention measures today.  Please keep your appointments with your specialists as you may have planned  Please go to the LAB at the blood drawing area for the tests to be done  ,You will be contacted by phone if any changes need to be made immediately.  Otherwise, you will receive a letter about your results with an explanation, but please check with MyChart first.  Please make an Appointment to return for your 1 year visit, or sooner if needed

## 2023-06-05 ENCOUNTER — Encounter: Payer: Self-pay | Admitting: Internal Medicine

## 2023-06-05 DIAGNOSIS — R7309 Other abnormal glucose: Secondary | ICD-10-CM | POA: Insufficient documentation

## 2023-06-05 NOTE — Assessment & Plan Note (Signed)

## 2023-06-05 NOTE — Assessment & Plan Note (Addendum)
 Lab Results  Component Value Date   HGBA1C 5.6 06/01/2023   Stable, pt to continue current medical treatment actos 15 every day with intent to d/c with wt loss if able with zepbound if ok with insurance

## 2023-06-05 NOTE — Assessment & Plan Note (Signed)
 Lab Results  Component Value Date   CREATININE 1.05 06/01/2023   Stable overall, cont to avoid nephrotoxins

## 2023-06-05 NOTE — Assessment & Plan Note (Signed)
 Last vitamin D Lab Results  Component Value Date   VD25OH 22.39 (L) 06/01/2023   Low, to start oral replacement

## 2023-06-05 NOTE — Assessment & Plan Note (Signed)
 Lab Results  Component Value Date   LDLCALC 67 06/01/2023   Stable, pt to continue current statin lovastatin 40 qd

## 2023-06-05 NOTE — Assessment & Plan Note (Signed)
 BP Readings from Last 3 Encounters:  06/01/23 122/80  02/22/23 (!) 142/86  12/20/22 126/70   Stable, pt to continue medical treatment hyzaar 100 12.5 qd

## 2023-06-05 NOTE — Assessment & Plan Note (Addendum)
 Uncontrolled, pt to start zepbound 2.5 mg weekly with intent to titrate

## 2023-06-13 ENCOUNTER — Encounter: Admitting: Internal Medicine

## 2023-06-20 ENCOUNTER — Ambulatory Visit: Payer: BC Managed Care – PPO | Admitting: Internal Medicine

## 2023-07-12 NOTE — Telephone Encounter (Signed)
 Pharmacy Patient Advocate Encounter  Received notification from [facility] that Prior Authorization for NURTEC 75MG  has been APPROVED from 11.18.24 to 12.18.25   PA #/Case ID/Reference #: [id number]

## 2023-07-17 ENCOUNTER — Other Ambulatory Visit: Payer: Self-pay | Admitting: Neurology

## 2023-08-10 ENCOUNTER — Encounter: Payer: Self-pay | Admitting: Neurology

## 2023-08-10 ENCOUNTER — Telehealth: Payer: Self-pay | Admitting: Neurology

## 2023-08-10 NOTE — Telephone Encounter (Signed)
 Pt called in stating he has a migraine today and he needs a note for work excusing him from work due to the migraine. 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 He hasn't had to have one in the past. He would like it to be sent to mychart.

## 2023-08-10 NOTE — Telephone Encounter (Signed)
 Please let patient know that we do not write letters without evaluating patient, but I will make an exception today since this is also new for him.   If his work is requiring documentation, he may need to get FMLA for intermittent leave when migraines occurs.

## 2023-08-10 NOTE — Telephone Encounter (Signed)
 Called patient and informed him of Dr. Basilio Both response below. Patient stated he did a telehealth appt and was able to get a doctors note until Friday. I informed patient that if his migraines continue to increase to please contact our office for a sooner appt that way for migraines and FMLA. Patient verbalized understanding and thanked us  for the call.

## 2023-08-14 ENCOUNTER — Other Ambulatory Visit: Payer: Self-pay | Admitting: Internal Medicine

## 2023-08-15 ENCOUNTER — Other Ambulatory Visit: Payer: Self-pay

## 2023-08-17 ENCOUNTER — Encounter: Payer: Self-pay | Admitting: Cardiology

## 2023-08-21 ENCOUNTER — Other Ambulatory Visit: Payer: Self-pay | Admitting: Internal Medicine

## 2023-08-23 ENCOUNTER — Other Ambulatory Visit: Payer: Self-pay

## 2023-09-08 DIAGNOSIS — H25812 Combined forms of age-related cataract, left eye: Secondary | ICD-10-CM | POA: Insufficient documentation

## 2023-09-20 DIAGNOSIS — H25811 Combined forms of age-related cataract, right eye: Secondary | ICD-10-CM | POA: Insufficient documentation

## 2023-10-03 ENCOUNTER — Ambulatory Visit
Admission: RE | Admit: 2023-10-03 | Discharge: 2023-10-03 | Disposition: A | Discharge status: Home / Self Care | Payer: BC Managed Care – PPO | Source: Ambulatory Visit | Attending: Emergency Medicine | Admitting: Emergency Medicine

## 2023-10-03 DIAGNOSIS — R918 Other nonspecific abnormal finding of lung field: Secondary | ICD-10-CM

## 2023-10-19 ENCOUNTER — Ambulatory Visit: Payer: Self-pay | Admitting: Emergency Medicine

## 2023-10-19 DIAGNOSIS — R918 Other nonspecific abnormal finding of lung field: Secondary | ICD-10-CM

## 2023-10-19 NOTE — Progress Notes (Signed)
 Spoke with the patient regarding RB note. Pt verbalized understanding and had no concerns. Order placed

## 2023-10-21 ENCOUNTER — Ambulatory Visit: Payer: Self-pay

## 2023-10-21 NOTE — Telephone Encounter (Signed)
 FYI Only or Action Required?: FYI only for provider.  Patient was last seen in primary care on 06/01/2023 by Norleen Lynwood ORN, MD.  Called Nurse Triage reporting Fever.  Symptoms began 3 days ago.  Interventions attempted: OTC medications: ibuprofen and Rest, hydration, or home remedies.  Symptoms are: unchanged.  Triage Disposition: See HCP Within 4 Hours (Or PCP Triage)  Patient/caregiver understands and will follow disposition?: No, wishes to speak with PCP  Copied from CRM #[id number]. Topic: Clinical - Red Word Triage >> Oct 21, 2023  4:33 PM Taleah C wrote: Red Word that prompted transfer to Nurse Triage: high body temperature at 100.2 for 3 days, cold sweats, headaches, coughing up green film Reason for Disposition  [1] Fever > 101 F (38.3 C) AND [2] age > 60 years  Answer Assessment - Initial Assessment Questions 1. TEMPERATURE: What is the most recent temperature?  How was it measured?      102.2 2. ONSET: When did the fever start?      Three days ago 3. CHILLS: Do you have chills? If yes: How bad are they?  (e.g., none, mild, moderate, severe)     Yes-severe 4. OTHER SYMPTOMS: Do you have any other symptoms besides the fever?  (e.g., abdomen pain, cough, diarrhea, earache, headache, sore throat, urination pain)     Cough-green sputum,  5. CAUSE: If there are no symptoms, ask: What do you think is causing the fever?      N/A 6. CONTACTS: Does anyone else in the family have an infection?     no 7. TREATMENT: What have you done so far to treat this fever? (e.g., OTC fever medicines)     Ibuprofen-600 mg, patient hasn't taken any Tylenol  8. IMMUNOCOMPROMISE: Do you have any of the following: diabetes, HIV positive, splenectomy, cancer chemotherapy, chronic steroid treatment, transplant patient, etc.?     no 10. TRAVEL: Have you traveled out of the country in the last month? (e.g., travel history, exposures)       No  Patient reports that he is on his way  home currently-about 2 hours outside of [place]. Patient endorses symptoms have been going on for three days. States he was seen at a walk in urgent care on Tuesday and was found to be negative for COVID and felt fine. Patient reports fever started back on Tuesday night-patient has been having fever 101-103 since Tuesday night. Patient endorse cough with green sputum, body sweats and headaches. Last took ibuprofen 600 mg prior to speaking to Nurse triage. Patient states he is unable to get to an urgent care today as he is traveling. Highly encouraged patient to be evaluated tomorrow if today doesn't work. Instructions given on ibuprofen and tylenol  usage with his symptoms. ED precautions given as well. Patient verbalized understanding and all questions answered.  Protocols used: [facility]

## 2023-10-22 ENCOUNTER — Ambulatory Visit (INDEPENDENT_AMBULATORY_CARE_PROVIDER_SITE_OTHER)

## 2023-10-22 ENCOUNTER — Ambulatory Visit
Admission: RE | Admit: 2023-10-22 | Discharge: 2023-10-22 | Disposition: A | Discharge status: Home / Self Care | Payer: Self-pay | Source: Ambulatory Visit | Attending: Family Medicine | Admitting: Family Medicine

## 2023-10-22 VITALS — BP 121/80 | HR 90 | Temp 100.4°F | Resp 18

## 2023-10-22 DIAGNOSIS — R051 Acute cough: Secondary | ICD-10-CM

## 2023-10-22 DIAGNOSIS — R509 Fever, unspecified: Secondary | ICD-10-CM

## 2023-10-22 DIAGNOSIS — J189 Pneumonia, unspecified organism: Secondary | ICD-10-CM | POA: Diagnosis not present

## 2023-10-22 LAB — POC COVID19/FLU A&B COMBO
Covid Antigen, POC: NEGATIVE
Influenza A Antigen, POC: NEGATIVE
Influenza B Antigen, POC: NEGATIVE

## 2023-10-22 MED ORDER — AMOXICILLIN-POT CLAVULANATE 875-125 MG PO TABS: 1.0000 | ORAL_TABLET | Freq: Two times a day (BID) | ORAL | 0 refills | Status: DC

## 2023-10-22 MED ORDER — PROMETHAZINE-DM 6.25-15 MG/5ML PO SYRP: 5.0000 mL | ORAL_SOLUTION | Freq: Every evening | ORAL | 0 refills | Status: DC | PRN

## 2023-10-22 MED ORDER — ALBUTEROL SULFATE HFA 108 (90 BASE) MCG/ACT IN AERS: 1.0000 | INHALATION_SPRAY | Freq: Four times a day (QID) | RESPIRATORY_TRACT | 0 refills | Status: AC | PRN

## 2023-10-22 MED ORDER — IBUPROFEN 600 MG PO TABS
600.0000 mg | ORAL_TABLET | Freq: Once | ORAL | Status: AC
  Administered 2023-10-22: 600 mg via ORAL

## 2023-10-22 MED ORDER — AZITHROMYCIN 250 MG PO TABS: 250.0000 mg | ORAL_TABLET | Freq: Every day | ORAL | 0 refills | Status: DC

## 2023-10-22 MED ORDER — BENZONATATE 200 MG PO CAPS: 200.0000 mg | ORAL_CAPSULE | Freq: Three times a day (TID) | ORAL | 0 refills | Status: DC | PRN

## 2023-10-22 NOTE — Discharge Instructions (Addendum)
 Start azithromycin  and Augmentin  antibiotics as prescribed.  Albuterol  inhaler as needed for wheezing or shortness of breath.  You may take Tessalon  3 times a day as needed for your cough.  You may take Promethazine  DM at night as needed for cough.  Please note this medication will make you drowsy.  Do not drink alcohol or drive on this medication.  Lots of rest and fluids.  Follow-up with your PCP in 2 to 3 days for recheck as well as in 4 weeks for repeat chest x-ray to ensure resolution of the infection.  Please go to the ER if you develop any worsening symptoms.  Hope you feel better soon!

## 2023-10-22 NOTE — ED Provider Notes (Signed)
 [facility]    CSN: [id number] Arrival date & time: 10/22/23  9065      History   Chief Complaint Chief Complaint  Patient presents with   Fever    Fever 103  sweating, weak no energy... - Entered by patient   Ear Pain    HPI Ruben West is a 68 y.o. male  presents for evaluation of URI symptoms for 4 days. Patient reports associated symptoms of cough, congestion, chills/sweats, fevers up to 102, sore throat, body aches, headache. Denies N/V/D, ear pain, shortness of breath. Patient does note have a hx of asthma. Patient is not an active smoker.   Reports no known sick contacts.  Pt has taken ibuprofen  and DayQuil OTC for symptoms. Pt has no other concerns at this time.      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   PUD (peptic ulcer disease)    Sleep apnea    on CPAP    Patient Active Problem List   Diagnosis Date Noted   Other abnormal glucose 06/05/2023   Low back pain 12/20/2022   Pulmonary nodules 11/23/2022   OSA (obstructive sleep apnea) 11/23/2022   Immunizations incomplete 11/23/2022   RLQ abdominal pain 04/06/2022   Nausea 04/06/2022   External hemorrhoid, bleeding 02/08/2022   Constipation 02/08/2022   Right renal stone 11/25/2021   Stage 3b chronic kidney disease (CKD) (HCC) 11/25/2021   Skin lesion of face 12/08/2020   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   Myofascial pain 05/09/2020   Vitamin D  deficiency 12/04/2019   Work related injury 02/06/2019   Prolapsed lumbar disc 02/06/2019   Lumbar  pain 01/31/2019   Erectile dysfunction 06/12/2018   DDD (degenerative disc disease), cervical 08/16/2017   Lumbar radiculopathy 08/16/2017   Myalgia 06/29/2017   Right thyroid  nodule 06/13/2017   C6 radiculopathy 06/08/2017   Cervical spine pain 06/07/2017   Degenerative arthritis of knee, bilateral 01/01/2016   Benign paroxysmal positional vertigo 12/10/2015   Encounter for well adult exam with abnormal findings 06/10/2015   Screening for prostate cancer 06/10/2015   Allergic rhinitis 06/10/2015   Skin nodule 06/10/2015   Screening examination for infectious disease 03/04/2014   Obesity (BMI 30-39.9) 01/13/2014   Migraine with aura 01/12/2014   Atypical chest pain 01/12/2014   Hyperlipidemia 01/12/2014   Migraine headache 06/30/2012   Chronic meniscal tear of knee 12/22/2011   Fatty liver disease, nonalcoholic 05/09/2008   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 Authorizing Provider  albuterol  (VENTOLIN  HFA) 108 (90 Base) MCG/ACT inhaler Inhale 1-2 puffs into the lungs every 6 (six) hours as needed. 10/22/23  Yes Adriane Gabbert, Jodi R, NP  amoxicillin -clavulanate (AUGMENTIN ) 875-125 MG tablet Take 1 tablet by mouth every 12 (twelve) hours. 10/22/23  Yes Keeyon Privitera, Jodi R, NP  azithromycin  (ZITHROMAX ) 250 MG tablet Take 1 tablet (250 mg total) by mouth daily. Take first 2 tablets together, then 1 every day until  finished. 10/22/23  Yes Suriah Peragine, Jodi R, NP  benzonatate  (TESSALON ) 200 MG capsule Take 1 capsule (200 mg total) by mouth 3 (three) times daily as needed. 10/22/23  Yes Clara Herbison, Jodi R, NP  promethazine -dextromethorphan (PROMETHAZINE -DM) 6.25-15 MG/5ML syrup Take 5 mLs by mouth at bedtime as needed for cough. 10/22/23  Yes Loreda Myla SAUNDERS, NP  aspirin  EC 81 MG tablet Take 81 mg by mouth every morning.    [provider]  Cholecalciferol (VITAMIN D -3) 25 MCG (1000 UT) CAPS Take 1 capsule by mouth daily.    [provider]  diazepam  (VALIUM ) 10 MG tablet Take 10 mg by mouth as needed. 02/14/23   [provider]  eletriptan  (RELPAX ) 40 MG tablet Take by mouth.    [provider]  ibuprofen  (ADVIL ) 800 MG tablet Take 800 mg by mouth every 6 (six) hours as needed for mild pain or moderate pain. 08/11/22   [provider]  losartan -hydrochlorothiazide  (HYZAAR) 100-12.5 MG tablet TAKE 1 TABLET BY MOUTH EVERY DAY 05/26/23   Norleen Lynwood ORN, MD  lovastatin  (MEVACOR ) 40 MG tablet TAKE 1 TABLET BY MOUTH EVERY DAY 08/23/23   Norleen Lynwood ORN, MD  meloxicam (MOBIC) 7.5 MG tablet Take 7.5-15 mg by mouth daily. 04/29/23   [provider]  methocarbamol (ROBAXIN) 750 MG tablet Take 750 mg by mouth 3 (three) times daily. 04/29/23   [provider]  ondansetron  (ZOFRAN  ODT) 4 MG disintegrating tablet Take 1 tablet (4 mg total) by mouth every 8 (eight) hours as needed for nausea or vomiting. 02/22/23   Patel, Donika K, DO  pioglitazone  (ACTOS ) 15 MG tablet TAKE 1 TABLET (15 MG TOTAL) BY MOUTH DAILY. 08/15/23   John, James W, MD  POTASSIUM PO Take by mouth.    [provider]  Rimegepant Sulfate (NURTEC) 75 MG TBDP TAKE 1 TABLET BY MOUTH AS NEEDED FOR MIGRAINE FIRST 15 MIN 07/18/23   Patel, Donika K, DO  tirzepatide  (ZEPBOUND ) 2.5 MG/0.5ML injection vial Inject 2.5 mg into the skin once a week. 06/01/23   Norleen Lynwood ORN, MD    Family History Family History  Problem  Relation Age of Onset   Heart attack Mother        MI, deceased 83   Seizures Mother    Emphysema Father        Deceased, 11   Healthy Daughter        x2   Seizures Grandchild    Cancer Neg Hx        no cancer in immediate family   Colon cancer Neg Hx    Esophageal cancer Neg Hx    Rectal cancer Neg Hx    Stomach cancer Neg Hx    Colon polyps Neg Hx     Social History Social History   Tobacco Use   Smoking status: Former    Types: Cigars   Smokeless tobacco: Never   Tobacco comments:    Quit smoking a year ago. PAP 08/04/22  Vaping  Use   Vaping status: Never Used  Substance Use Topics   Alcohol use: Not Currently    Alcohol/week: 1.0 standard drink of alcohol    Types: 1 Cans of beer per week   Drug use: No     Allergies   Gabapentin   Review of Systems Review of Systems  Constitutional:  Positive for chills and fever.  HENT:  Positive for congestion and sore throat.   Respiratory:  Positive for cough.   Musculoskeletal:  Positive for myalgias.  Neurological:  Positive for headaches.     Physical Exam Triage Vital Signs ED Triage Vitals  Encounter Vitals Group     BP 10/22/23 0959 121/80     Girls Systolic BP Percentile --      Girls Diastolic BP Percentile --      Boys Systolic BP Percentile --      Boys Diastolic BP Percentile --      Pulse Rate 10/22/23 0959 90     Resp 10/22/23 0959 18     Temp 10/22/23 0959 (!) 100.4 F (38 C)     Temp Source 10/22/23 0959 Oral     SpO2 10/22/23 0959 94 %     Weight --      Height --      Head Circumference --      Peak Flow --      Pain Score 10/22/23 0958 3     Pain Loc --      Pain Education --      Exclude from Growth Chart --    No data found.  Updated Vital Signs BP 121/80 (BP Location: Right Arm)   Pulse 90   Temp (!) 100.4 F (38 C) (Oral)   Resp 18   SpO2 94%   Visual Acuity Right Eye Distance:   Left Eye Distance:   Bilateral Distance:    Right Eye Near:   Left Eye Near:     Bilateral Near:     Physical Exam Vitals and nursing note reviewed.  Constitutional:      General: He is not in acute distress.    Appearance: Normal appearance. He is not ill-appearing or toxic-appearing.  HENT:     Head: Normocephalic and atraumatic.     Right Ear: Tympanic membrane and ear canal normal.     Left Ear: Tympanic membrane and ear canal normal.     Nose: Congestion present.     Mouth/Throat:     Mouth: Mucous membranes are moist.     Pharynx: No posterior oropharyngeal erythema.  Eyes:     Pupils: Pupils are equal, round, and reactive to light.  Cardiovascular:     Rate and Rhythm: Normal rate and regular rhythm.     Heart sounds: Normal heart sounds.  Pulmonary:     Effort: Pulmonary effort is normal.     Breath sounds: Normal breath sounds. No wheezing, rhonchi or rales.  Musculoskeletal:     Cervical back: Normal range of motion and neck supple.  Lymphadenopathy:     Cervical: No cervical adenopathy.  Skin:    General: Skin is warm and dry.  Neurological:     General: No focal deficit present.     Mental Status: He is alert and oriented to person, place, and time.  Psychiatric:        Mood and Affect: Mood normal.        Behavior: Behavior normal.      UC Treatments / Results  Labs (all labs ordered are listed, but only abnormal results are displayed) Labs Reviewed  POC COVID19/FLU A&B COMBO   Basic metabolic panel Order: [id number]  Status: Final result     Next appt: 02/27/2024 at 08:30 AM in Neurology (Donika MARLA Blanch, DO)     Dx: Pure hypercholesterolemia   Test Result Released: Yes (seen)     Messages: Seen   1 Result Note     1 Patient Communication     View Follow-Up Encounter     1 HM Topic          Component Ref Range & Units (hover) 4 mo ago (06/01/23) 10 mo ago (12/20/22) 1 yr ago (06/21/22) 1 yr ago (04/06/22) 1 yr ago (03/04/22) 1 yr ago (11/25/21) 1 yr ago (11/20/21)  Sodium 141 138 142 139 R  139 141 R  Potassium 4.5 3.6  4.1 4.2 R 4.1 4.3 3.8 R  Chloride 104 105 105 104 R 107 R 98 102 R  CO2 31 23 28 26  R 21 R 29 29 R  Glucose, Bld 108 High  167 High  103 High  104 High  CM 97 R 110 High  133 High  CM  BUN 19 21 16 14  R  19 15 R  Creatinine, Ser 1.05 1.03 1.07 1.23 R 1.0 R 1.75 High  1.53 High  R  GFR 73.33 75.28 CM 72.17 CM   40.15 Low  CM   Comment: Calculated using the CKD-EPI Creatinine Equation (2021)  Calcium 9.4 9.1 9.3 9.2 R  9.8 10.2 R  Resulting Agency [place] [facility] [place] [facility] [place] [facility] [place] [facility]        Specimen Collected: 06/01/23 10:03 Last Resulted: 06/01/23 12:54   EKG   Radiology DG Chest 2 View Result Date: 10/22/2023 CLINICAL DATA:  Cough and fever. EXAM: CHEST - 2 VIEW COMPARISON:  Chest CT 10/03/2023 FINDINGS: New left central/posterior airspace disease is compatible with pneumonia. Right lung clear. Interstitial markings are diffusely coarsened with chronic features. Cardiopericardial silhouette is at upper limits of normal for size. No acute bony abnormality. IMPRESSION: New patchy airspace disease in the left mid lung compatible with pneumonia. Electronically Signed   By: Camellia Candle M.D.   On: 10/22/2023 10:58    Procedures Procedures (including critical care time)  Medications Ordered in UC Medications  ibuprofen  (ADVIL ) tablet 600 mg (600 mg Oral Given 10/22/23 1038)    Initial Impression / Assessment and Plan / UC Course  I have reviewed the triage vital signs and the nursing notes.  Pertinent labs & imaging results that were available during my care of the patient were reviewed by me and considered in my medical decision making (see chart for details).     Reviewed exam and symptoms with patient.  He was given ibuprofen  in clinic for fever.  Wet read of chest x-ray shows left lobe infiltrate, will start azithromycin  and Augmentin .  Tessalon  as needed for cough during the day and Promethazine  DM as needed for cough at  night, side effect profile reviewed.  Advise rest fluids and PCP follow-up 2 to 3 days for recheck as well as in 4 weeks for repeat chest x-ray.  Strict ER precautions reviewed and patient verbalized understanding. Final Clinical Impressions(s) / UC Diagnoses   Final diagnoses:  Acute cough  Fever, unspecified  Pneumonia of left lung due to infectious organism, unspecified part of lung     Discharge Instructions  Start azithromycin  and Augmentin  antibiotics as prescribed.  Albuterol  inhaler as needed for wheezing or shortness of breath.  You may take Tessalon  3 times a day as needed for your cough.  You may take Promethazine  DM at night as needed for cough.  Please note this medication will make you drowsy.  Do not drink alcohol or drive on this medication.  Lots of rest and fluids.  Follow-up with your PCP in 2 to 3 days for recheck as well as in 4 weeks for repeat chest x-ray to ensure resolution of the infection.  Please go to the ER if you develop any worsening symptoms.  Hope you feel better soon!     ED Prescriptions     Medication Sig Dispense Auth. Provider   benzonatate  (TESSALON ) 200 MG capsule Take 1 capsule (200 mg total) by mouth 3 (three) times daily as needed. 20 capsule Vieva Brummitt, Jodi R, NP   promethazine -dextromethorphan (PROMETHAZINE -DM) 6.25-15 MG/5ML syrup Take 5 mLs by mouth at bedtime as needed for cough. 118 mL Cecilia Nishikawa, Jodi R, NP   azithromycin  (ZITHROMAX ) 250 MG tablet Take 1 tablet (250 mg total) by mouth daily. Take first 2 tablets together, then 1 every day until finished. 6 tablet Jill Ruppe, Jodi R, NP   amoxicillin -clavulanate (AUGMENTIN ) 875-125 MG tablet Take 1 tablet by mouth every 12 (twelve) hours. 14 tablet Laurelai Lepp, Jodi R, NP   albuterol  (VENTOLIN  HFA) 108 (90 Base) MCG/ACT inhaler Inhale 1-2 puffs into the lungs every 6 (six) hours as needed. 1 each Loreda Myla SAUNDERS, NP      PDMP not reviewed this encounter.   Loreda Myla SAUNDERS, NP 10/22/23 1102

## 2023-10-22 NOTE — ED Triage Notes (Addendum)
 Pt present with fever x four days and cold sweats. Pt states he has been coughing a lot and has coughed up green phlegm. Pt states he had a COVID test done on Wednesday that was negative.  States he has taken Ibuprofen  and Tylenol  with no relief. Has some back pain after coughing   Pt reports tylenol  does not work for him.

## 2023-10-26 ENCOUNTER — Ambulatory Visit (INDEPENDENT_AMBULATORY_CARE_PROVIDER_SITE_OTHER): Admitting: Internal Medicine

## 2023-10-26 ENCOUNTER — Encounter: Payer: Self-pay | Admitting: Internal Medicine

## 2023-10-26 VITALS — BP 104/64 | HR 78 | Temp 98.6°F | Ht 68.5 in | Wt 255.8 lb

## 2023-10-26 DIAGNOSIS — R7309 Other abnormal glucose: Secondary | ICD-10-CM

## 2023-10-26 DIAGNOSIS — E559 Vitamin D deficiency, unspecified: Secondary | ICD-10-CM | POA: Diagnosis not present

## 2023-10-26 DIAGNOSIS — J189 Pneumonia, unspecified organism: Secondary | ICD-10-CM | POA: Insufficient documentation

## 2023-10-26 DIAGNOSIS — N1832 Chronic kidney disease, stage 3b: Secondary | ICD-10-CM

## 2023-10-26 MED ORDER — HYDROCODONE BIT-HOMATROP MBR 5-1.5 MG/5ML PO SOLN: 5.0000 mL | Freq: Three times a day (TID) | ORAL | 0 refills | Status: DC | PRN

## 2023-10-26 NOTE — Progress Notes (Signed)
 Patient ID: Ruben West, male   DOB: [birth year], 68 y.o.   MRN: [id number]        Chief Complaint: follow up left CAP       HPI:  Ruben West is a 68 y.o. male here with above, seen at [facility] 7/26 with 2 wks onset feverish, cough, feeling ill.  Cxr with left CAP, today overall improved with less symptoms, fever, no chills and Pt denies chest pain, increased sob or doe, wheezing, orthopnea, PND, increased LE swelling, palpitations, dizziness or syncope.    Pt denies polydipsia, polyuria, or new focal neuro s/s.   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   FATTY LIVER DISEASE 05/09/2008   GERD 04/04/2007   no per pt   History of kidney stones    Hypercholesteremia    HYPERGLYCEMIA 04/04/2007   HYPERTENSION 11/23/2006   Migraines    NASH (nonalcoholic steatohepatitis)    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 His smoking use included cigars. He has never used smokeless tobacco. He reports that he does not currently use alcohol after a past usage of about 1.0 standard drink of alcohol per  week. He reports that he does not use drugs. family history includes Emphysema in his father; Healthy in his daughter; Heart attack in his mother; Seizures in his grandchild and mother. Allergies  Allergen Reactions   Gabapentin Nausea And Vomiting   Current Outpatient Medications on File Prior to Visit  Medication Sig Dispense Refill   albuterol  (VENTOLIN  HFA) 108 (90 Base) MCG/ACT inhaler Inhale 1-2 puffs into the lungs every 6 (six) hours as needed. 1 each 0   amoxicillin -clavulanate (AUGMENTIN ) 875-125 MG tablet Take 1 tablet by mouth every 12 (twelve) hours. 14 tablet 0   aspirin  EC 81 MG tablet Take 81 mg by mouth every morning.     azithromycin  (ZITHROMAX ) 250 MG tablet Take 1 tablet (250 mg total) by mouth daily. Take first 2 tablets together, then 1 every day until finished. 6 tablet 0   benzonatate  (TESSALON ) 200 MG capsule Take 1 capsule (200 mg total) by mouth 3 (three) times daily as needed. 20 capsule 0   Cholecalciferol (VITAMIN D -3) 25 MCG (1000 UT) CAPS Take 1 capsule by mouth daily.     diazepam  (VALIUM ) 10 MG tablet Take 10 mg by mouth as needed.     eletriptan  (RELPAX ) 40 MG tablet Take by mouth.  ibuprofen  (ADVIL ) 800 MG tablet Take 800 mg by mouth every 6 (six) hours as needed for mild pain or moderate pain.     losartan -hydrochlorothiazide  (HYZAAR) 100-12.5 MG tablet TAKE 1 TABLET BY MOUTH EVERY DAY 90 tablet 3   lovastatin  (MEVACOR ) 40 MG tablet TAKE 1 TABLET BY MOUTH EVERY DAY 90 tablet 2   meloxicam (MOBIC) 7.5 MG tablet Take 7.5-15 mg by mouth daily.     methocarbamol (ROBAXIN) 750 MG tablet Take 750 mg by mouth 3 (three) times daily.     ondansetron  (ZOFRAN  ODT) 4 MG disintegrating tablet Take 1 tablet (4 mg total) by mouth every 8 (eight) hours as needed for nausea or vomiting. 20 tablet 1   pioglitazone  (ACTOS ) 15 MG tablet TAKE 1 TABLET (15 MG TOTAL) BY MOUTH DAILY. 90 tablet 2   POTASSIUM PO Take by mouth.     promethazine -dextromethorphan (PROMETHAZINE -DM)  6.25-15 MG/5ML syrup Take 5 mLs by mouth at bedtime as needed for cough. 118 mL 0   Rimegepant Sulfate (NURTEC) 75 MG TBDP TAKE 1 TABLET BY MOUTH AS NEEDED FOR MIGRAINE FIRST 15 MIN 10 tablet 0   tirzepatide  (ZEPBOUND ) 2.5 MG/0.5ML injection vial Inject 2.5 mg into the skin once a week. 2 mL 11   No current facility-administered medications on file prior to visit.        ROS:  All others reviewed and negative.  Objective        PE:  BP 104/64   Pulse 78   Temp 98.6 F (37 C)   Ht 5' 8.5 (1.74 m)   Wt 255 lb 12.8 oz (116 kg)   SpO2 97%   BMI 38.33 kg/m                 Constitutional: Pt appears in NAD               HENT: Head: NCAT.                Right Ear: External ear normal.                 Left Ear: External ear normal.                Eyes: . Pupils are equal, round, and reactive to light. Conjunctivae and EOM are normal               Nose: without d/c or deformity               Neck: Neck supple. Gross normal ROM               Cardiovascular: Normal rate and regular rhythm.                 Pulmonary/Chest: Effort normal and breath sounds without rales or wheezing.                Abd:  Soft, NT, ND, + BS, no organomegaly               Neurological: Pt is alert. At baseline orientation, motor grossly intact               Skin: Skin is warm. No rashes, no other new lesions, LE edema - none               Psychiatric: Pt behavior is normal without agitation   Micro: none  Cardiac tracings I have personally interpreted today:  none  Pertinent Radiological findings (summarize): none  Lab Results  Component Value Date   WBC 7.3 06/01/2023   HGB 16.2 06/01/2023   HCT 48.0 06/01/2023   PLT 176.0 06/01/2023   GLUCOSE 108 (H) 06/01/2023   CHOL 147 06/01/2023   TRIG 164.0 (H) 06/01/2023   HDL 47.30 06/01/2023   LDLDIRECT 48.0 06/21/2022   LDLCALC 67 06/01/2023   ALT 32 06/01/2023   AST 20 06/01/2023   NA 141 06/01/2023   K 4.5 06/01/2023   CL 104 06/01/2023    CREATININE 1.05 06/01/2023   BUN 19 06/01/2023   CO2 31 06/01/2023   TSH 3.16 06/01/2023   PSA 0.35 06/01/2023   HGBA1C 5.6 06/01/2023   MICROALBUR <0.7 06/01/2023   Assessment/Plan:  Ruben West is a 68 y.o. White or Caucasian [1] male with  has a past medical history of ALLERGIC RHINITIS (11/23/2006), Back pain, ERECTILE DYSFUNCTION, ORGANIC (04/04/2007), FATTY LIVER DISEASE (05/09/2008), GERD (04/04/2007), History of kidney stones, Hypercholesteremia, HYPERGLYCEMIA (04/04/2007), HYPERTENSION (11/23/2006), Migraines, NASH (nonalcoholic steatohepatitis), Other testicular hypofunction (05/19/2007), PUD (peptic ulcer disease), and Sleep apnea.  CAP (community acquired pneumonia) Clinically improving, pt to continue zpack/augmentin  course, ok for hycodan prn cough,  to f/u any worsening symptoms or concern; also for f/u cxr in 3 wks  Other abnormal glucose Lab Results  Component Value Date   HGBA1C 5.6 06/01/2023   Stable, pt to continue current medical treatment actos  15 qd   Stage 3b chronic kidney disease (CKD) (HCC) Lab Results  Component Value Date   CREATININE 1.05 06/01/2023   Stable overall, cont to avoid nephrotoxins   Vitamin D  deficiency Last vitamin D  Lab Results  Component Value Date   VD25OH 22.39 (L) 06/01/2023   Low, to start oral replacement  Followup: Return if symptoms worsen or fail to improve.  [name], MD 10/26/2023 7:41 PM [facility] Medicine

## 2023-10-26 NOTE — Assessment & Plan Note (Signed)
 Last vitamin D Lab Results  Component Value Date   VD25OH 22.39 (L) 06/01/2023   Low, to start oral replacement

## 2023-10-26 NOTE — Assessment & Plan Note (Addendum)
 Clinically improving, pt to continue zpack/augmentin  course, ok for hycodan prn cough,  to f/u any worsening symptoms or concern; also for f/u cxr in 3 wks

## 2023-10-26 NOTE — Assessment & Plan Note (Signed)
 Lab Results  Component Value Date   CREATININE 1.05 06/01/2023   Stable overall, cont to avoid nephrotoxins

## 2023-10-26 NOTE — Assessment & Plan Note (Signed)
 Lab Results  Component Value Date   HGBA1C 5.6 06/01/2023   Stable, pt to continue current medical treatment actos  15 qd

## 2023-10-26 NOTE — Patient Instructions (Signed)
 Please take all new medication as prescribed - the new cough medicine  OK to finish the antibiotics that you are taking now.  Please continue all other medications as before, and refills have been done if requested.  Please have the pharmacy call with any other refills you may need.  Please keep your appointments with your specialists as you may have planned  Please go to the XRAY Department in the first floor for the x-ray testing - in 2-3 weeks at the Xray Dept at [facility]  You will be contacted by phone if any changes need to be made immediately.  Otherwise, you will receive a letter about your results with an explanation, but please check with MyChart first.

## 2023-10-28 ENCOUNTER — Encounter: Payer: Self-pay | Admitting: Internal Medicine

## 2023-10-28 ENCOUNTER — Telehealth: Payer: Self-pay

## 2023-10-28 ENCOUNTER — Other Ambulatory Visit: Payer: Self-pay | Admitting: Neurology

## 2023-10-28 NOTE — Telephone Encounter (Signed)
 Copied from CRM [id number]. Topic: Clinical - Medical Advice >> Oct 28, 2023  2:01 PM Ruben West wrote: Reason for CRM: Patient would like to know since he is gettting over pneumonia,when can he go back on his cpap machine?Please advise.

## 2023-10-31 NOTE — Telephone Encounter (Signed)
 Oh yes, that would be fine, as there should be no problem with doing this

## 2023-11-21 ENCOUNTER — Ambulatory Visit
Admission: RE | Admit: 2023-11-21 | Discharge: 2023-11-21 | Disposition: A | Discharge status: Home / Self Care | Source: Ambulatory Visit | Attending: Internal Medicine | Admitting: Internal Medicine

## 2023-11-21 DIAGNOSIS — J189 Pneumonia, unspecified organism: Secondary | ICD-10-CM | POA: Diagnosis not present

## 2023-11-25 ENCOUNTER — Ambulatory Visit: Payer: Self-pay | Admitting: Internal Medicine

## 2023-12-13 ENCOUNTER — Encounter: Payer: Self-pay | Admitting: Gastroenterology

## 2023-12-21 ENCOUNTER — Other Ambulatory Visit: Payer: Self-pay | Admitting: Neurology

## 2024-02-06 ENCOUNTER — Ambulatory Visit: Admitting: Cardiology

## 2024-02-09 ENCOUNTER — Other Ambulatory Visit (HOSPITAL_COMMUNITY): Payer: Self-pay

## 2024-02-09 ENCOUNTER — Telehealth: Payer: Self-pay | Admitting: Pharmacy Technician

## 2024-02-09 NOTE — Telephone Encounter (Signed)
 Pharmacy Patient Advocate Encounter  Received notification from [employer] that Prior Authorization for NURTEC 75MG  has been APPROVED from 10.14.25 to 11.13.26. Ran test claim, Copay is $40. This test claim was processed through [facility]- copay amounts may vary at other pharmacies due to pharmacy/plan contracts, or as the patient moves through the different stages of their insurance plan.   PA #/Case ID/Reference #: [id number]

## 2024-02-09 NOTE — Telephone Encounter (Signed)
 Pharmacy Patient Advocate Encounter   Received notification from CoverMyMeds that prior authorization for NURTEC 75MG  is required/requested.   Insurance verification completed.   The patient is insured through [employer].   Per test claim: PA required; PA submitted to above mentioned insurance via Latent Key/confirmation #/EOC [id number] P Status is pending

## 2024-02-13 ENCOUNTER — Ambulatory Visit: Admitting: Emergency Medicine

## 2024-02-27 ENCOUNTER — Other Ambulatory Visit: Payer: Self-pay

## 2024-02-27 ENCOUNTER — Ambulatory Visit (INDEPENDENT_AMBULATORY_CARE_PROVIDER_SITE_OTHER): Payer: BC Managed Care – PPO | Admitting: Neurology

## 2024-02-27 ENCOUNTER — Encounter: Payer: Self-pay | Admitting: Internal Medicine

## 2024-02-27 ENCOUNTER — Ambulatory Visit: Admitting: Internal Medicine

## 2024-02-27 ENCOUNTER — Ambulatory Visit

## 2024-02-27 ENCOUNTER — Ambulatory Visit: Payer: Self-pay

## 2024-02-27 ENCOUNTER — Encounter: Payer: Self-pay | Admitting: Neurology

## 2024-02-27 VITALS — BP 132/84 | HR 73 | Temp 98.2°F | Ht 69.0 in | Wt 257.0 lb

## 2024-02-27 VITALS — BP 148/93 | HR 77 | Ht 69.0 in | Wt 257.0 lb

## 2024-02-27 DIAGNOSIS — R519 Headache, unspecified: Secondary | ICD-10-CM | POA: Diagnosis not present

## 2024-02-27 DIAGNOSIS — I10 Essential (primary) hypertension: Secondary | ICD-10-CM | POA: Diagnosis not present

## 2024-02-27 DIAGNOSIS — F419 Anxiety disorder, unspecified: Secondary | ICD-10-CM | POA: Diagnosis not present

## 2024-02-27 DIAGNOSIS — Z1211 Encounter for screening for malignant neoplasm of colon: Secondary | ICD-10-CM

## 2024-02-27 DIAGNOSIS — Z23 Encounter for immunization: Secondary | ICD-10-CM

## 2024-02-27 DIAGNOSIS — F322 Major depressive disorder, single episode, severe without psychotic features: Secondary | ICD-10-CM | POA: Insufficient documentation

## 2024-02-27 DIAGNOSIS — G43909 Migraine, unspecified, not intractable, without status migrainosus: Secondary | ICD-10-CM

## 2024-02-27 DIAGNOSIS — E559 Vitamin D deficiency, unspecified: Secondary | ICD-10-CM | POA: Diagnosis not present

## 2024-02-27 DIAGNOSIS — G4484 Primary exertional headache: Secondary | ICD-10-CM

## 2024-02-27 MED ORDER — NORTRIPTYLINE HCL 10 MG PO CAPS: ORAL_CAPSULE | ORAL | 3 refills | Status: DC

## 2024-02-27 MED ORDER — CITALOPRAM HYDROBROMIDE 20 MG PO TABS: 20.0000 mg | ORAL_TABLET | Freq: Every day | ORAL | 3 refills | Status: DC

## 2024-02-27 MED ORDER — NURTEC 75 MG PO TBDP: ORAL_TABLET | ORAL | 11 refills | Status: AC

## 2024-02-27 MED ORDER — CLONAZEPAM 0.5 MG PO TABS: 0.5000 mg | ORAL_TABLET | Freq: Two times a day (BID) | ORAL | 1 refills | Status: AC | PRN

## 2024-02-27 MED ORDER — KETOROLAC TROMETHAMINE 60 MG/2ML IM SOLN
60.0000 mg | Freq: Once | INTRAMUSCULAR | Status: AC
  Administered 2024-02-27: 60 mg via INTRAMUSCULAR

## 2024-02-27 NOTE — Patient Instructions (Addendum)
 You had the flu shot today  Please take all new medication as prescribed- the celexa 20 mg per day every day, and clonazepam twice per day but only as needed  You are given the work letter to be out to Dec 22  Please continue all other medications as before, and refills have been done if requested.  Please have the pharmacy call with any other refills you may need.  Please keep your appointments with your specialists as you may have planned  You will be contacted regarding the referral for: Counseling, and colonoscopy

## 2024-02-27 NOTE — Progress Notes (Signed)
 Patient ID: Ruben West, male   DOB: Oct 13, 1955, 68 y.o.   MRN: [id number]        Chief Complaint: follow up depression, anxiety, htn, low vit d       HPI:  Ruben West is a 68 y.o. male here overall doing ok but has mod to severe depressive symptoms with anxiety and near panic at times in the past 3 wks.   Has multiple social stressors at home, and has felt like leaving to just be on the streets.  Denies SI or HI. 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    Pt denies chest pain, increased sob or doe, wheezing, orthopnea, PND, increased LE swelling, palpitations, dizziness or syncope.   Pt denies polydipsia, polyuria, or new focal neuro s/s.    Pt denies fever, wt loss, night sweats, loss of appetite, or other constitutional symptoms   Due for colonoscopy, due for flu shot  Did see neurology for worsening migraines in the last few wks as well.   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 His smoking use included cigars. He has never used smokeless tobacco. He reports that he does not currently use alcohol after a past usage of about 1.0 standard drink of alcohol per week. He reports that he does not use drugs. family history includes Emphysema in his father; Healthy in his daughter; Heart attack in his mother; Seizures in his grandchild and mother. Allergies  Allergen Reactions   Gabapentin Nausea And Vomiting   Current Outpatient Medications on File Prior to Visit  Medication Sig Dispense Refill   albuterol  (VENTOLIN  HFA) 108 (90 Base) MCG/ACT inhaler Inhale 1-2 puffs into the lungs every 6 (six) hours as needed. 1 each 0   aspirin  EC 81 MG tablet Take 81 mg by mouth every morning.     Cholecalciferol (VITAMIN D -3) 25 MCG (1000 UT) CAPS Take 1 capsule by mouth daily.     HYDROcodone  bit-homatropine (HYCODAN) 5-1.5 MG/5ML syrup Take 5 mLs by mouth every 8 (eight) hours as needed for cough. 180 mL 0   ibuprofen  (ADVIL ) 800 MG tablet Take 800 mg by mouth every 6 (six) hours as needed for mild  pain or moderate pain.     losartan -hydrochlorothiazide  (HYZAAR) 100-12.5 MG tablet TAKE 1 TABLET BY MOUTH EVERY DAY 90 tablet 3   lovastatin  (MEVACOR ) 40 MG tablet TAKE 1 TABLET BY MOUTH EVERY DAY 90 tablet 2   nortriptyline  (PAMELOR ) 10 MG capsule Start 10mg  at bedtime for 2 week, then increase to 2 tablet at bedtime 60 capsule 3   ondansetron  (ZOFRAN  ODT) 4 MG disintegrating tablet Take 1 tablet (4 mg total) by mouth every 8 (eight) hours as needed for nausea or vomiting. 20 tablet 1   pioglitazone  (ACTOS ) 15 MG tablet TAKE 1 TABLET (15 MG TOTAL) BY MOUTH DAILY. 90 tablet 2   POTASSIUM PO Take by mouth. (Patient taking differently: Take by mouth. As Needed)     promethazine -dextromethorphan (PROMETHAZINE -DM) 6.25-15 MG/5ML syrup Take 5 mLs by mouth at bedtime as needed  for cough. 118 mL 0   Rimegepant Sulfate (NURTEC) 75 MG TBDP TAKE 1 TABLET BY MOUTH AS NEEDED FOR MIGRAINE WITHIN FIRST 15 MIN 10 tablet 11   No current facility-administered medications on file prior to visit.        ROS:  All others reviewed and negative.  Objective        PE:  BP 132/84 (BP Location: Right Arm, Patient Position: Sitting, Cuff Size: Normal)   Pulse 73   Temp 98.2 F (36.8 C) (Oral)   Ht 5' 9 (1.753 m)   Wt 257 lb (116.6 kg)   SpO2 98%   BMI 37.95 kg/m                 Constitutional: Pt appears in NAD               HENT: Head: NCAT.                Right Ear: External ear normal.                 Left Ear: External ear normal.                Eyes: . Pupils are equal, round, and reactive to light. Conjunctivae and EOM are normal               Nose: without d/c or deformity               Neck: Neck supple. Gross normal ROM               Cardiovascular: Normal rate and regular rhythm.                 Pulmonary/Chest: Effort normal and breath sounds without rales or wheezing.                Abd:  Soft, NT, ND, + BS, no organomegaly               Neurological: Pt is alert. At baseline orientation, motor grossly intact               Skin: Skin is warm. No rashes, no other new lesions, LE edema - none               Psychiatric: Pt behavior is normal without agitation , depressed affect, nervous  Micro: none  Cardiac tracings I have personally interpreted today:  none  Pertinent Radiological findings (summarize): none   Lab Results  Component Value Date   WBC 7.3 06/01/2023   HGB 16.2 06/01/2023   HCT 48.0 06/01/2023   PLT 176.0  06/01/2023   GLUCOSE 108 (H) 06/01/2023   CHOL 147 06/01/2023   TRIG 164.0 (H) 06/01/2023   HDL 47.30 06/01/2023   LDLDIRECT 48.0 06/21/2022   LDLCALC 67 06/01/2023   ALT 32 06/01/2023   AST 20 06/01/2023   NA 141 06/01/2023   K 4.5 06/01/2023   CL 104 06/01/2023   CREATININE 1.05 06/01/2023   BUN 19 06/01/2023   CO2 31  06/01/2023   TSH 3.16 06/01/2023   PSA 0.35 06/01/2023   HGBA1C 5.6 06/01/2023   MICROALBUR <0.7 06/01/2023   Assessment/Plan:  LINVILLE DECAROLIS is a 68 y.o. White or Caucasian [1] male with  has a past medical history of ALLERGIC RHINITIS (11/23/2006), Back pain, ERECTILE DYSFUNCTION, ORGANIC (04/04/2007), FATTY LIVER DISEASE (05/09/2008), GERD (04/04/2007), History of kidney stones, Hypercholesteremia, HYPERGLYCEMIA (04/04/2007), HYPERTENSION (11/23/2006), Migraines, NASH (nonalcoholic steatohepatitis), Other testicular hypofunction (05/19/2007), PUD (peptic ulcer disease), and Sleep apnea.  Vitamin D  deficiency Last vitamin D  Lab Results  Component Value Date   VD25OH 22.39 (L) 06/01/2023   Low, to start oral replacement   Essential hypertension BP Readings from Last 3 Encounters:  02/27/24 132/84  02/27/24 (!) 148/93  10/26/23 104/64   Stable, pt to continue medical treatment hyzaar 100 12.5   Current severe episode of major depressive disorder without psychotic features (HCC) Mod to severe, pt for counseling referral, also celexa 20 mg every day, work note given for off work x 3 wks  Anxiety With near panic at times - for clonazepam 0.5 mg prn  Followup: Return in about 3 months (around 05/27/2024).  [name], MD 02/27/2024 7:03 PM [facility] [place] [facility] - [facility] Internal Medicine

## 2024-02-27 NOTE — Assessment & Plan Note (Signed)
 With near panic at times - for clonazepam 0.5 mg prn

## 2024-02-27 NOTE — Telephone Encounter (Signed)
 FYI Only or Action Required?: FYI only for provider: appointment scheduled on today.  Patient was last seen in primary care on 10/26/2023 by Norleen Lynwood ORN, MD.  Called Nurse Triage reporting Anxiety.  Symptoms began about a month ago.  Interventions attempted: Other: neuro eval.  Symptoms are: gradually worsening.  Triage Disposition: See PCP When Office is Open (Within 3 Days)  Patient/caregiver understands and will follow disposition?: Yes     Reason for Disposition  MODERATE anxiety (e.g., persistent or frequent anxiety symptoms; interferes with sleep, school, or work)  Answer Assessment - Initial Assessment Questions 1. CONCERN: Did anything happen that prompted you to call today?      Increased anxiety  2. ANXIETY SYMPTOMS: Can you describe how you (your loved one; patient) have been feeling? (e.g., tense, restless, panicky, anxious, keyed up, overwhelmed, sense of impending doom).      Panicky to go to work or drive 3. ONSET: How long have you been feeling this way? (e.g., hours, days, weeks)     One month 4. SEVERITY: How would you rate the level of anxiety? (e.g., 0 - 10; or mild, moderate, severe).     Moderate-severe 5. FUNCTIONAL IMPAIRMENT: How have these feelings affected your ability to do daily activities? Have you had more difficulty than usual doing your normal daily activities? (e.g., getting better, same, worse; self-care, school, work, interactions)     Difficulty going to work and driving 6. HISTORY: Have you felt this way before? Have you ever been diagnosed with an anxiety problem in the past? (e.g., generalized anxiety disorder, panic attacks, PTSD). If Yes, ask: How was this problem treated? (e.g., medicines, counseling, etc.)      7. RISK OF HARM - SUICIDAL IDEATION: Do you ever have thoughts of hurting or killing yourself? If Yes, ask:  Do you have these feelings now? Do you have a plan on how you would do this?     Denies-states  he thought about it one time but would never act on it, it's the cowards way out Denies SI at this time.  8. TREATMENT:  What has been done so far to treat this anxiety? (e.g., medicines, relaxation strategies). What has helped?     Evaluated by Neurology today for migraines, neuro recommended pcp for anxiety  9. THERAPIST: Do you have a counselor or therapist? If Yes, ask: What is their name?     no 10. POTENTIAL TRIGGERS: Do you drink caffeinated beverages (e.g., coffee, colas, teas), and how much daily? Do you drink alcohol or use any drugs? Have you started any new medicines recently?        11. PATIENT SUPPORT: Who is with you now? Who do you live with? Do you have family or friends who you can talk to?         12. OTHER SYMPTOMS: Do you have any other symptoms? (e.g., feeling depressed, trouble concentrating, trouble sleeping, trouble breathing, palpitations or fast heartbeat, chest pain, sweating, nausea, or diarrhea)       Migraines  13. PREGNANCY: Is there any chance you are pregnant? When was your last menstrual period?  Protocols used: Anxiety and Panic Attack-A-AH

## 2024-02-27 NOTE — Progress Notes (Signed)
 Follow-up Visit   Date: 02/27/24   Ruben West MRN: [id number] DOB: 08/17/1955   Interim History: Ruben West is a 68 y.o. right-handed Caucasian male with hyperlipidemia and hypertension returning to the clinic for follow-up of worsening migraines.  The patient was accompanied to the clinic by self.  IMPRESSION/PLAN: Assessment & Plan Episodic migraine without aura transitioning into chronic migraine, likely due to stress and anxiety. Current medication regimen insufficient. - Initiated nortriptyline 10 mg at bedtime for two weeks, then increase to 20 mg at bedtime. - Continue Rimegepant sulfate (Nurtec) as rescue medication. - Toradol  injection administered today - CT head wo contrast due to worsening headache and recent head injury - He has high anxiety which is contributing to his worsening headaches and he requested time off work. I have provided work excuse for one week to allow his sufficient time to contact PCP to address anxiety/stress, which seems to be the driving factor behind his worsening headaches  Return to clinic in 4 months  ----------------------------------- UPDATE 02/22/2023:  He reports migraines are under good control.  He gets a migraine once every 3 months.  Most of the time, Nurtec provides good relief, especially if he catches it within the first 30-minutes.  If he does not take Nurtec early enough, then the migraine will last about a day and he has associated nausea/vomiting.   UPDATE 02/27/2024:  Discussed the use of AI scribe software for clinical note transcription with the patient, who gave verbal consent to proceed.  History of Present Illness Ruben West is a 68 year old male who presents with worsening headaches and anxiety.  He has been experiencing worsening headaches over the past month, with a significant increase in frequency and severity. The headaches start in the frontal region and radiate to the occipital area, occurring  approximately every other day and lasting for 24 hours or more. During these episodes, he experiences facial numbness. He uses Nurtec, taking half a tablet in the morning and a whole tablet when the pain is severe, but reports limited relief.  He has a history of a concussion and recent stressors, including his granddaughter's hospitalization and his wife's injury, which he believes may be contributing to his symptoms. He feels anxious, shaky, and unable to handle daily activities. The headaches wake him from sleep but are not exacerbated by coughing or sneezing.  He recalls hitting his head about two months ago, resulting in a bump but no immediate concern. He is not taking any medication for anxiety. 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  No exacerbation of headaches with coughing or sneezing.   Medications:  Current Outpatient Medications on File Prior to Visit  Medication Sig Dispense Refill   albuterol  (VENTOLIN  HFA) 108 (90 Base) MCG/ACT inhaler Inhale 1-2 puffs into the lungs every 6 (six) hours as needed. 1 each 0   aspirin  EC 81 MG tablet Take 81 mg by mouth every morning.     Cholecalciferol (VITAMIN D -3) 25 MCG (1000 UT) CAPS Take 1 capsule by mouth daily.     HYDROcodone  bit-homatropine (HYCODAN) 5-1.5 MG/5ML syrup Take 5 mLs by mouth every 8 (eight) hours as needed for cough. 180 mL 0   ibuprofen  (ADVIL ) 800 MG tablet Take  800 mg by mouth every 6 (six) hours as needed for mild pain or moderate pain.     losartan -hydrochlorothiazide  (HYZAAR) 100-12.5 MG tablet TAKE 1 TABLET BY MOUTH EVERY DAY 90 tablet 3   lovastatin  (MEVACOR ) 40 MG tablet TAKE 1 TABLET BY MOUTH EVERY DAY 90 tablet 2   ondansetron  (ZOFRAN  ODT) 4 MG disintegrating tablet Take 1 tablet (4 mg total) by  mouth every 8 (eight) hours as needed for nausea or vomiting. 20 tablet 1   pioglitazone  (ACTOS ) 15 MG tablet TAKE 1 TABLET (15 MG TOTAL) BY MOUTH DAILY. 90 tablet 2   POTASSIUM PO Take by mouth. (Patient taking differently: Take by mouth. As Needed)     promethazine -dextromethorphan (PROMETHAZINE -DM) 6.25-15 MG/5ML syrup Take 5 mLs by mouth at bedtime as needed for cough. 118 mL 0   amoxicillin -clavulanate (AUGMENTIN ) 875-125 MG tablet Take 1 tablet by mouth every 12 (twelve) hours. 14 tablet 0   azithromycin  (ZITHROMAX ) 250 MG tablet Take 1 tablet (250 mg total) by mouth daily. Take first 2 tablets together, then 1 every day until finished. 6 tablet 0   benzonatate  (TESSALON ) 200 MG capsule Take 1 capsule (200 mg total) by mouth 3 (three) times daily as needed. (Patient not taking: Reported on 02/27/2024) 20 capsule 0   diazepam  (VALIUM ) 10 MG tablet Take 10 mg by mouth as needed. (Patient not taking: Reported on 02/27/2024)     eletriptan  (RELPAX ) 40 MG tablet Take by mouth. (Patient not taking: Reported on 02/27/2024)     meloxicam (MOBIC) 7.5 MG tablet Take 7.5-15 mg by mouth daily. (Patient not taking: Reported on 02/27/2024)     methocarbamol (ROBAXIN) 750 MG tablet Take 750 mg by mouth 3 (three) times daily. (Patient not taking: Reported on 02/27/2024)     tirzepatide  (ZEPBOUND ) 2.5 MG/0.5ML injection vial Inject 2.5 mg into the skin once a week. 2 mL 11   No current facility-administered medications on file prior to visit.    Allergies:  Allergies  Allergen Reactions   Gabapentin Nausea And Vomiting    Vital Signs:  BP (!) 148/93   Pulse 77   Ht 5' 9 (1.753 m)   Wt 257 lb (116.6 kg)   SpO2 95%   BMI 37.95 kg/m   Neurological Exam: MENTAL STATUS including orientation to time, place, person, recent and remote memory, attention span and concentration, language, and fund of knowledge is normal.  He appears very anxious, slightly pressured speech, and shaky at times during the  interview.  Speech is not dysarthric.  CRANIAL NERVES: Visual fields are intact throughout. Pupils equal round and reactive to light.  Normal conjugate, extra-ocular eye movements in all directions of gaze.  No ptosis.  Face is symmetric.  Palate elevates symmetrically and tongue is midline.   MOTOR:  Motor strength is 5/5 in all extremities.  No atrophy, fasciculations or abnormal movements.  No pronator drift.  Tone is normal.    REFLEXES:  Reflexes are 2+/4 throughout.  SENSATION:  Reduced at the MCP bilaterally and intact at the knees.    COORDINATION/GAIT:  Gait narrow based and stable. Mild unsteadiness with tandem gait, but able to perform.   Data:  CT head and cervical spine 04/15/2020:  No acute intracranial or cervical spine abnormalities.    Thank you for allowing me to participate in patient's care.  If I can answer any additional questions, I would be pleased to do so.    Sincerely,    Tearsa Kowalewski K. Tobie, DO

## 2024-02-27 NOTE — Telephone Encounter (Signed)
 Copied from CRM #[id number]. Topic: Clinical - Red Word Triage >> Feb 27, 2024  9:22 AM Montie POUR wrote: Red Word that prompted transfer to Nurse Triage:  He feels like he is having a nervous breakdown. 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 He breaks down and starts crying. This has been going on for about 1 month and gotten worse in the last 2 weeks. He went to Dr. Tobie (Neurology) today for migraines and the doctor told him to follow up with his primary.  PAS went to transfer patient and was unable to retrieve the call. Phone call placed to patient-obtained voicemail. Left a message for patient to call back to Nurse Triage. Will place in call backs.

## 2024-02-27 NOTE — Addendum Note (Signed)
 Addended by: TAFT MOATS on: 02/27/2024 03:07 PM   Modules accepted: Orders

## 2024-02-27 NOTE — Assessment & Plan Note (Signed)
 BP Readings from Last 3 Encounters:  02/27/24 132/84  02/27/24 (!) 148/93  10/26/23 104/64   Stable, pt to continue medical treatment hyzaar 100 12.5

## 2024-02-27 NOTE — Assessment & Plan Note (Signed)
 Last vitamin D Lab Results  Component Value Date   VD25OH 22.39 (L) 06/01/2023   Low, to start oral replacement

## 2024-02-27 NOTE — Patient Instructions (Signed)
 Start nortriptyline 10mg  at bedtime for 2 week, then increase to 2 tablet at bedtime  OK to take Nurtec for severe migraine  Follow-up with primary care provider for anxiety management

## 2024-02-27 NOTE — Assessment & Plan Note (Signed)
 Mod to severe, pt for counseling referral, also celexa 20 mg every day, work note given for off work x 3 wks

## 2024-02-28 ENCOUNTER — Encounter: Payer: Self-pay | Admitting: Neurology

## 2024-03-02 ENCOUNTER — Inpatient Hospital Stay: Admission: RE | Admit: 2024-03-02 | Discharge: 2024-03-02 | Attending: Neurology

## 2024-03-02 DIAGNOSIS — R519 Headache, unspecified: Secondary | ICD-10-CM

## 2024-03-04 NOTE — Progress Notes (Deleted)
 Cardiology Office Note    Date:  03/04/2024  ID:  Ruben West, DOB 1955-09-17, MRN [id number] PCP:  Norleen Lynwood ORN, MD  Cardiologist:  None  Electrophysiologist:  None   Chief Complaint: ***  History of Present Illness: .    Ruben West is a 68 y.o. male with visit-pertinent history of elevated coronary calcium score, hypertension, hyperlipidemia and sleep apnea.  Patient previously seen by Dr. Lavona on 09/03/2022 for elevated coronary calcium score.  Patient with coronary calcium score in 06/2022 of 671, 85th percentile.  Patient noted some mild shortness of breath with physical activity however denied any chest pain or pressure.  He was regulate mowing the lawn with a push mower and tolerated well.  Exercise stress test in 09/2022 without evidence of ischemia.  Today he presents for follow-up.  He reports that he   Elevated calcium score: Calcium score of 671 in 06/2022, 85th percentile.  Exercise stress test in 09/2022 without evidence of ischemia. Today he reports that he Reviewed ED precautions Continue  HTN: Blood pressure today  HLD: Last lipid profile on  Sleep apnea:   Labwork independently reviewed:   ROS: .   *** denies chest pain, shortness of breath, lower extremity edema, fatigue, palpitations, melena, hematuria, hemoptysis, diaphoresis, weakness, presyncope, syncope, orthopnea, and PND.  All other systems are reviewed and otherwise negative.  Studies Reviewed: SABRA    EKG:  EKG is ordered today, personally reviewed, demonstrating ***     CV Studies: Cardiac studies reviewed are outlined and summarized above. Otherwise please see EMR for full report. Cardiac Studies & Procedures   ______________________________________________________________________________________________   STRESS TESTS  EXERCISE TOLERANCE TEST (ETT) 10/11/2022  Interpretation Summary   No ST deviation was noted.   Prior study not available for comparison.  Negative adequate  stress test without evidence of ischemia at given workload.        CT SCANS  CT CARDIAC SCORING (SELF PAY ONLY) 07/26/2022  Addendum 07/30/2022  4:31 PM ADDENDUM REPORT: 07/30/2022 16:29  EXAM: OVER-READ INTERPRETATION  CT CHEST  The following report is an over-read performed by radiologist Dr. Andrea Gasman of [facility] on 07/30/2022. This over-read does not include interpretation of cardiac or coronary anatomy or pathology. The coronary CTA interpretation by the cardiologist is attached.  COMPARISON:  Remote chest CT 07/01/2005, lung bases from abdominal CT 04/06/2022  FINDINGS: Vascular: No aortic atherosclerosis. The included aorta is normal in caliber.  Mediastinum/nodes: No adenopathy or mass. Unremarkable esophagus.  Lungs: Mixed solid and ground-glass nodular density in the left lower lobe measures 2.8 x 2.2 cm series 2, image 51. This is new from lung bases of 04/06/2022 abdominal CT. There is a sub solid nodular density in the left upper lobe, solid component measuring 5 mm, series 2, image 17. No pleural fluid. The included airways are patent.  Upper abdomen: No acute or unexpected findings.  Musculoskeletal: There are no acute or suspicious osseous abnormalities. Thoracic spondylosis with anterior spurring.  IMPRESSION: 1. Mixed solid and ground-glass 2.8 cm nodular density in the left lower lobe. This is new from lung bases of abdominal CT 04/06/2022. This favors an infectious or inflammatory etiology rather than neoplasm. 2. Sub solid nodule in the left upper lobe, with solid component measuring 5 mm. Per Fleischner Society Guidelines, recommend a non-contrast Chest CT at 3-6 months. Subsequent management based on the most suspicious nodule(s). These guidelines do not apply to immunocompromised patients and patients with cancer. Follow up  in patients with significant comorbidities as clinically warranted. For lung cancer  screening, adhere to Lung-RADS guidelines. Reference: Radiology. 2017; 284(1):228-43.   Electronically Signed By: Andrea Gasman M.D. On: 07/30/2022 16:29  Narrative CLINICAL DATA:  Cardiovascular Disease Risk stratification  EXAM: Coronary Calcium Score  TECHNIQUE: A gated, non-contrast computed tomography scan of the heart was performed using 3mm slice thickness. Axial images were analyzed on a dedicated workstation. Calcium scoring of the coronary arteries was performed using the Agatston method.  FINDINGS: Coronary arteries: Normal origins.  Coronary Calcium Score:  Left main: 0  Left anterior descending artery: 567  Left circumflex artery: 29  Right coronary artery: 75  Total: 671  Percentile: 85  Pericardium: Normal.  Ascending Aorta: Normal caliber.  Non-cardiac: See separate report from [facility].  IMPRESSION: Coronary calcium score of 671. This was 85th percentile for age-, race-, and sex-matched controls.  RECOMMENDATIONS: Coronary artery calcium (CAC) score is a strong predictor of incident coronary heart disease (CHD) and provides predictive information beyond traditional risk factors. CAC scoring is reasonable to use in the decision to withhold, postpone, or initiate statin therapy in intermediate-risk or selected borderline-risk asymptomatic adults (age 58-75 years and LDL-C >=70 to <190 mg/dL) who do not have diabetes or established atherosclerotic cardiovascular disease (ASCVD).* In intermediate-risk (10-year ASCVD risk >=7.5% to <20%) adults or selected borderline-risk (10-year ASCVD risk >=5% to <7.5%) adults in whom a CAC score is measured for the purpose of making a treatment decision the following recommendations have been made:  If CAC=0, it is reasonable to withhold statin therapy and reassess in 5 to 10 years, as long as higher risk conditions are absent (diabetes mellitus, family history of premature CHD in first  degree relatives (males <55 years; females <65 years), cigarette smoking, or LDL >=190 mg/dL).  If CAC is 1 to 99, it is reasonable to initiate statin therapy for patients >=18 years of age.  If CAC is >=100 or >=75th percentile, it is reasonable to initiate statin therapy at any age.  Cardiology referral should be considered for patients with CAC scores >=400 or >=75th percentile.  *2018 AHA/ACC/AACVPR/AAPA/ABC/ACPM/ADA/AGS/APhA/ASPC/NLA/PCNA Guideline on the Management of Blood Cholesterol: A Report of the American College of Cardiology/American Heart Association Task Force on Clinical Practice Guidelines. J Am Coll Cardiol. 2019;73(24):3168-3209.  Lonni Nanas, MD  Electronically Signed: By: Lonni Nanas M.D. On: 07/27/2022 10:46     ______________________________________________________________________________________________       Current Reported Medications:.    No outpatient medications have been marked as taking for the 03/05/24 encounter (Appointment) with Alena Blankenbeckler D, NP.    Physical Exam:    VS:  There were no vitals taken for this visit.   Wt Readings from Last 3 Encounters:  02/27/24 257 lb (116.6 kg)  02/27/24 257 lb (116.6 kg)  10/26/23 255 lb 12.8 oz (116 kg)    GEN: Well nourished, well developed in no acute distress NECK: No JVD; No carotid bruits CARDIAC: ***RRR, no murmurs, rubs, gallops RESPIRATORY:  Clear to auscultation without rales, wheezing or rhonchi  ABDOMEN: Soft, non-tender, non-distended EXTREMITIES:  No edema; No acute deformity     Asessement and Plan:.     ***     Disposition: F/u with ***  Signed, Doryan Bahl D Tima Curet, NP

## 2024-03-05 ENCOUNTER — Ambulatory Visit: Admitting: Cardiology

## 2024-03-05 DIAGNOSIS — G4733 Obstructive sleep apnea (adult) (pediatric): Secondary | ICD-10-CM

## 2024-03-05 DIAGNOSIS — E782 Mixed hyperlipidemia: Secondary | ICD-10-CM

## 2024-03-05 DIAGNOSIS — I2510 Atherosclerotic heart disease of native coronary artery without angina pectoris: Secondary | ICD-10-CM

## 2024-03-05 DIAGNOSIS — I10 Essential (primary) hypertension: Secondary | ICD-10-CM

## 2024-03-09 ENCOUNTER — Ambulatory Visit: Payer: Self-pay | Admitting: Neurology

## 2024-03-16 ENCOUNTER — Ambulatory Visit: Admitting: Psychology

## 2024-03-19 ENCOUNTER — Ambulatory Visit: Admitting: Internal Medicine

## 2024-03-19 ENCOUNTER — Encounter: Payer: Self-pay | Admitting: Internal Medicine

## 2024-03-19 VITALS — BP 124/80 | HR 83 | Temp 98.4°F | Ht 69.0 in | Wt 257.0 lb

## 2024-03-19 DIAGNOSIS — F322 Major depressive disorder, single episode, severe without psychotic features: Secondary | ICD-10-CM | POA: Diagnosis not present

## 2024-03-19 DIAGNOSIS — R7309 Other abnormal glucose: Secondary | ICD-10-CM

## 2024-03-19 DIAGNOSIS — I10 Essential (primary) hypertension: Secondary | ICD-10-CM | POA: Diagnosis not present

## 2024-03-19 DIAGNOSIS — E559 Vitamin D deficiency, unspecified: Secondary | ICD-10-CM

## 2024-03-19 DIAGNOSIS — N1832 Chronic kidney disease, stage 3b: Secondary | ICD-10-CM | POA: Diagnosis not present

## 2024-03-19 NOTE — Progress Notes (Signed)
 Patient ID: Ruben West, male   DOB: 12-Jul-1955, 68 y.o.   MRN: [id number]        Chief Complaint: follow up depression, htn, ckd3b, and low vit d       HPI:  Ruben West is a 68 y.o. male here overall doing ok; did have unfortunate double vision for a day recently and attributed this to the new Celexa  start, so stopped after a week of taking.  Has ongoing multiple social stressors, but really needs to get back to work, as his home life is more stressful than work.  Denies worsening depressive symptoms, suicidal ideation, or panic; has ongoing anxiety.  Pt states he has bounced back quickly, declines need for further med trial or counseling.         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 His smoking use included cigars. He has never used smokeless tobacco. He reports that he does not currently use alcohol after a past usage of about 1.0 standard  drink of alcohol per week. He reports that he does not use drugs. family history includes Emphysema in his father; Healthy in his daughter; Heart attack in his mother; Seizures in his grandchild and mother. Allergies[1] Medications Ordered Prior to Encounter[2]      ROS:  All others reviewed and negative.  Objective        PE:  BP 124/80 (BP Location: Left Arm, Patient Position: Sitting, Cuff Size: Normal)   Pulse 83   Temp 98.4 F (36.9 C) (Oral)   Ht 5' 9 (1.753 m)   Wt 257 lb (116.6 kg)   SpO2 98%   BMI 37.95 kg/m                 Constitutional: Pt appears in NAD               HENT: Head: NCAT.                Right Ear: External ear normal.                 Left Ear: External ear normal.                Eyes: . Pupils are equal, round, and reactive to light. Conjunctivae and EOM are normal  Nose: without d/c or deformity               Neck: Neck supple. Gross normal ROM               Cardiovascular: Normal rate and regular rhythm.                 Pulmonary/Chest: Effort normal and breath sounds without rales or wheezing.                Abd:  Soft, NT, ND, + BS, no organomegaly               Neurological: Pt is alert. At baseline orientation, motor grossly intact               Skin: Skin is warm. No rashes, no other new lesions, LE edema - none               Psychiatric: Pt behavior is normal without agitation , mod nervous, less depressed affect, more animaated  Micro: none  Cardiac tracings I have personally interpreted today:  none  Pertinent Radiological findings (summarize): none   Lab Results  Component Value Date   WBC 7.3 06/01/2023   HGB 16.2 06/01/2023   HCT 48.0 06/01/2023   PLT 176.0 06/01/2023   GLUCOSE 108 (H) 06/01/2023   CHOL 147 06/01/2023   TRIG 164.0 (H) 06/01/2023   HDL 47.30 06/01/2023   LDLDIRECT 48.0 06/21/2022   LDLCALC 67 06/01/2023   ALT 32 06/01/2023   AST 20 06/01/2023   NA 141 06/01/2023   K 4.5 06/01/2023   CL 104  06/01/2023   CREATININE 1.05 06/01/2023   BUN 19 06/01/2023   CO2 31 06/01/2023   TSH 3.16 06/01/2023   PSA 0.35 06/01/2023   HGBA1C 5.6 06/01/2023   MICROALBUR <0.7 06/01/2023   Assessment/Plan:  Ruben West is a 68 y.o. White or Caucasian [1] male with  has a past medical history of ALLERGIC RHINITIS (11/23/2006), Back pain, ERECTILE DYSFUNCTION, ORGANIC (04/04/2007), FATTY LIVER DISEASE (05/09/2008), GERD (04/04/2007), History of kidney stones, Hypercholesteremia, HYPERGLYCEMIA (04/04/2007), HYPERTENSION (11/23/2006), Migraines, NASH (nonalcoholic steatohepatitis), Other testicular hypofunction (05/19/2007), PUD (peptic ulcer disease), and Sleep apnea.  Vitamin D  deficiency Last vitamin D  Lab Results  Component Value Date   VD25OH 22.39 (L) 06/01/2023   Low, to start oral replacement   Stage 3b chronic kidney disease (CKD) (HCC) Lab Results  Component Value Date   CREATININE 1.05 06/01/2023   Stable overall, cont to avoid nephrotoxins ckd3b  Other abnormal glucose Lab Results  Component Value Date   HGBA1C 5.6 06/01/2023   Stable, pt to continue current medical treatment actos  15 mg qd   Essential hypertension BP Readings from Last 3 Encounters:  03/19/24 124/80  02/27/24 132/84  02/27/24 (!) 148/93   Stable, pt to continue medical treatment hyzaar 100 12.5 mg qd    Current severe episode of major depressive disorder without psychotic features (HCC) Pt appears bright animated and bounced back already from major depressive symptoms last visit, states unable to tolerate the celexa  and declines other med trial or counseling referral,  pt has no SI or HI,   He has a list of work responsibilites that is physical but he states he is able to do these, and form signed. Pt should f/u with any worsening s/s.    Without psychotic features  Followup: Return if symptoms worsen or fail to improve.  [name], MD 03/22/2024 7:17 AM [facility]  Primary Care - [facility].contj     [1]  Allergies Allergen Reactions   Gabapentin Nausea And Vomiting  [2]  Current Outpatient Medications on File Prior to Visit  Medication Sig Dispense Refill   albuterol  (VENTOLIN  HFA) 108 (90 Base) MCG/ACT inhaler Inhale 1-2 puffs into the lungs every 6 (six) hours as needed. 1 each 0   aspirin  EC 81 MG tablet Take 81 mg by mouth every morning.     Cholecalciferol (VITAMIN D -3) 25 MCG (1000 UT) CAPS Take 1 capsule by mouth daily.     clonazePAM  (KLONOPIN ) 0.5 MG tablet Take 1 tablet (0.5 mg total) by mouth 2 (two) times daily as needed for anxiety. 30 tablet 1   ibuprofen  (ADVIL ) 800 MG tablet Take 800 mg by mouth every 6 (six) hours as needed for mild pain or moderate pain.     losartan -hydrochlorothiazide  (HYZAAR) 100-12.5 MG tablet TAKE 1 TABLET BY MOUTH EVERY DAY 90 tablet 3   lovastatin  (MEVACOR ) 40 MG tablet TAKE 1 TABLET BY MOUTH EVERY DAY 90 tablet 2   nortriptyline  (PAMELOR ) 10 MG capsule Start 10mg  at bedtime for 2 week, then increase to 2 tablet at bedtime 60 capsule 3   ondansetron  (ZOFRAN  ODT) 4 MG disintegrating tablet Take 1 tablet (4 mg total) by mouth every 8 (eight) hours as needed for nausea or vomiting. 20 tablet 1   pioglitazone  (ACTOS ) 15 MG tablet TAKE 1 TABLET (15 MG TOTAL) BY MOUTH DAILY. 90 tablet 2   POTASSIUM PO Take by mouth. (Patient taking differently: Take by mouth. As Needed)     Rimegepant Sulfate (NURTEC) 75 MG TBDP TAKE 1 TABLET BY MOUTH AS NEEDED FOR MIGRAINE WITHIN FIRST 15 MIN 10 tablet 11   HYDROcodone  bit-homatropine (HYCODAN) 5-1.5 MG/5ML syrup Take 5 mLs by mouth every 8 (eight) hours as needed for cough. 180 mL 0   promethazine -dextromethorphan (PROMETHAZINE -DM) 6.25-15 MG/5ML syrup Take 5 mLs by mouth at bedtime as needed for cough. 118 mL 0   No current facility-administered medications on file prior to visit.

## 2024-03-19 NOTE — Patient Instructions (Signed)
 Please continue all other medications as before, and refills have been done if requested.  Please have the pharmacy call with any other refills you may need.  Please continue your efforts at being more active, low cholesterol diet, and weight control  Please keep your appointments with your specialists as you may have planned  Your form is signed today

## 2024-03-20 ENCOUNTER — Other Ambulatory Visit: Payer: Self-pay | Admitting: Neurology

## 2024-03-22 ENCOUNTER — Encounter: Payer: Self-pay | Admitting: Internal Medicine

## 2024-03-22 NOTE — Assessment & Plan Note (Signed)
 Last vitamin D Lab Results  Component Value Date   VD25OH 22.39 (L) 06/01/2023   Low, to start oral replacement

## 2024-03-22 NOTE — Assessment & Plan Note (Signed)
 Lab Results  Component Value Date   HGBA1C 5.6 06/01/2023   Stable, pt to continue current medical treatment actos  15 mg qd

## 2024-03-22 NOTE — Assessment & Plan Note (Signed)
 BP Readings from Last 3 Encounters:  03/19/24 124/80  02/27/24 132/84  02/27/24 (!) 148/93   Stable, pt to continue medical treatment hyzaar 100 12.5 mg qd

## 2024-03-22 NOTE — Assessment & Plan Note (Addendum)
 Pt appears bright animated and bounced back already from major depressive symptoms last visit, states unable to tolerate the celexa  and declines other med trial or counseling referral,  pt has no SI or HI,   He has a list of work responsibilites that is physical but he states he is able to do these, and form signed. Pt should f/u with any worsening s/s.    Without psychotic features

## 2024-03-22 NOTE — Assessment & Plan Note (Signed)
 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

## 2024-04-11 ENCOUNTER — Encounter: Payer: Self-pay | Admitting: Gastroenterology

## 2024-04-20 ENCOUNTER — Other Ambulatory Visit: Payer: Self-pay | Admitting: Internal Medicine

## 2024-04-23 ENCOUNTER — Emergency Department (HOSPITAL_COMMUNITY)

## 2024-04-23 ENCOUNTER — Emergency Department (HOSPITAL_COMMUNITY): Admission: EM | Admit: 2024-04-23 | Discharge: 2024-04-23 | Disposition: A | Discharge status: Home / Self Care

## 2024-04-23 ENCOUNTER — Encounter (HOSPITAL_COMMUNITY): Payer: Self-pay | Admitting: Emergency Medicine

## 2024-04-23 ENCOUNTER — Other Ambulatory Visit: Payer: Self-pay

## 2024-04-23 DIAGNOSIS — M7918 Myalgia, other site: Secondary | ICD-10-CM

## 2024-04-23 DIAGNOSIS — I10 Essential (primary) hypertension: Secondary | ICD-10-CM | POA: Insufficient documentation

## 2024-04-23 DIAGNOSIS — M542 Cervicalgia: Secondary | ICD-10-CM | POA: Insufficient documentation

## 2024-04-23 DIAGNOSIS — R0781 Pleurodynia: Secondary | ICD-10-CM | POA: Insufficient documentation

## 2024-04-23 DIAGNOSIS — S0990XA Unspecified injury of head, initial encounter: Secondary | ICD-10-CM | POA: Insufficient documentation

## 2024-04-23 DIAGNOSIS — Z79899 Other long term (current) drug therapy: Secondary | ICD-10-CM | POA: Insufficient documentation

## 2024-04-23 DIAGNOSIS — Z7982 Long term (current) use of aspirin: Secondary | ICD-10-CM | POA: Insufficient documentation

## 2024-04-23 DIAGNOSIS — W000XXA Fall on same level due to ice and snow, initial encounter: Secondary | ICD-10-CM | POA: Insufficient documentation

## 2024-04-23 MED ORDER — KETOROLAC TROMETHAMINE 30 MG/ML IJ SOLN
15.0000 mg | Freq: Once | INTRAMUSCULAR | Status: AC
  Administered 2024-04-23: 15 mg via INTRAMUSCULAR
  Filled 2024-04-23: qty 1

## 2024-04-23 MED ORDER — LIDOCAINE 5 % EX PTCH
3.0000 | MEDICATED_PATCH | CUTANEOUS | Status: DC
  Administered 2024-04-23: 3 via TRANSDERMAL
  Filled 2024-04-23: qty 3

## 2024-04-23 MED ORDER — METAXALONE 800 MG PO TABS: 800.0000 mg | ORAL_TABLET | Freq: Three times a day (TID) | ORAL | 0 refills | Status: AC

## 2024-04-23 NOTE — Discharge Instructions (Signed)
 Today you were seen after a fall.  I suspect your symptoms are likely due to musculoskeletal pain and minor head injury.  You may alternate Tylenol /Motrin  as as needed for pain.  You have also been prescribed a short course of muscle relaxers outpatient.  Please do not operate a vehicle as these medications may make you drowsy.  Please return to the ED if you have uncontrollable vomiting, double vision, or loss of bowel or bladder control.  Thank you for letting us  treat you today. After reviewing your imaging, I feel you are safe to go home. Please follow up with your PCP in the next several days and provide them with your records from this visit. Return to the Emergency Room if pain becomes severe or symptoms worsen.

## 2024-04-23 NOTE — ED Triage Notes (Addendum)
 Patient slipped on ice and fell on his left side. He did strike his head. He is not on a blood thinner and reports loss of consciousness. He now complains of left rib pain especially with deep breathing and movement. He also complains of nausea, left side buttock numbness and a headache that radiates down the neck. Tenderness in that area is reported.    EMS vitals: 136 palpated BP 80 HR 18 RR 96% SPO2 on room air

## 2024-04-23 NOTE — ED Provider Triage Note (Signed)
 Emergency Medicine Provider Triage Evaluation Note  Ruben West , a 69 y.o. male  was evaluated in triage.  Pt complains of fall.  Patient reports he was walking out of his building and walked into his room which had a bicycle and slipped and fell backwards onto the ramp.  He denies he hit his head and back during this incident.  He endorses a brief episode of loss of consciousness, denies any blood thinners.  He reports he laid there for a short period of time and got up and went inside to lay on EMS.  He currently is endorsing pain to the back of his head radiating down to his neck.  He also reports left rib pain worsening with deep inspiration and palpation.  Patient also endorses some left hip pain.  Review of Systems  Positive: Nausea, pain in the back of his head and left-sided rib pain Negative: Shortness of breath, dizziness, visual disturbances, numbness or tingling.  Physical Exam  BP (!) 184/113 (BP Location: Left Arm)   Pulse 88   Temp 98 F (36.7 C) (Oral)   Resp 18   SpO2 96%  Gen:   Awake, no distress   Resp:  Normal effort, clear to auscultation all fields there is pain with deep inspiration MSK:   Patient is in a c-collar, patient has pain to his left sided movements with movement of his arm and deep inspiration Other:  No noted bruising throughout chest wall or lacerations noted, exam limited due to cervical collar.  Medical Decision Making  Medically screening exam initiated at 1:21 PM.  Appropriate orders placed.  [name] was informed that the remainder of the evaluation will be completed by another provider, this initial triage assessment does not replace that evaluation, and the importance of remaining in the ED until their evaluation is complete.     Myriam Fonda RAMAN, [place] 04/23/24 1325

## 2024-04-23 NOTE — ED Provider Notes (Signed)
 " [place] EMERGENCY DEPARTMENT AT [facility] Provider Note   CSN: [id number] Arrival date & time: 04/23/24  1257     Patient presents with: Fall and Head Injury   Ruben West is a 69 y.o. male.  Past medical history significant for GERD, migraines, and hypertension presents today after a slip and fall on ice.  Patient fell onto his left side and struck his head.  Patient did have brief loss of consciousness and is now reporting left rib pain especially with movement and deep inspiration.  Patient also endorses left buttock numbness that is since resolved and headache that radiates down the neck.  Patient is not anticoagulated.  Patient denies diplopia, tinnitus, numbness, weakness, saddle anesthesia, loss of bowel or bladder control, or any other complaints at this time.    Fall Associated symptoms include headaches.  Head Injury Associated symptoms: headache, nausea and neck pain        Prior to Admission medications  Medication Sig Start Date End Date Taking? Authorizing Provider  metaxalone  (SKELAXIN ) 800 MG tablet Take 1 tablet (800 mg total) by mouth 3 (three) times daily. 04/23/24  Yes Maeci Kalbfleisch N, PA-C  albuterol  (VENTOLIN  HFA) 108 (90 Base) MCG/ACT inhaler Inhale 1-2 puffs into the lungs every 6 (six) hours as needed. 10/22/23   Loreda Myla SAUNDERS, NP  aspirin  EC 81 MG tablet Take 81 mg by mouth every morning.    [provider]  Cholecalciferol (VITAMIN D -3) 25 MCG (1000 UT) CAPS Take 1 capsule by mouth daily.    [provider]  clonazePAM  (KLONOPIN ) 0.5 MG tablet Take 1 tablet (0.5 mg total) by mouth 2 (two) times daily as needed for anxiety. 02/27/24   Norleen Lynwood ORN, MD  ibuprofen  (ADVIL ) 800 MG tablet Take 800 mg by mouth every 6 (six) hours as needed for mild pain or moderate pain. 08/11/22   [provider]  losartan -hydrochlorothiazide  (HYZAAR) 100-12.5 MG tablet TAKE 1 TABLET BY MOUTH EVERY DAY 05/26/23   Norleen Lynwood ORN, MD   lovastatin  (MEVACOR ) 40 MG tablet TAKE 1 TABLET BY MOUTH EVERY DAY 08/23/23   Norleen Lynwood ORN, MD  nortriptyline  (PAMELOR ) 10 MG capsule Start 10mg  at bedtime for 2 week, then increase to 2 tablet at bedtime 02/27/24   Patel, Donika K, DO  ondansetron  (ZOFRAN  ODT) 4 MG disintegrating tablet Take 1 tablet (4 mg total) by mouth every 8 (eight) hours as needed for nausea or vomiting. 02/22/23   Patel, Donika K, DO  pioglitazone  (ACTOS ) 15 MG tablet TAKE 1 TABLET (15 MG TOTAL) BY MOUTH DAILY. 04/20/24   John, James W, MD  POTASSIUM PO Take by mouth. Patient taking differently: Take by mouth. As Needed    [provider]  Rimegepant Sulfate (NURTEC) 75 MG TBDP TAKE 1 TABLET BY MOUTH AS NEEDED FOR MIGRAINE WITHIN FIRST 15 MIN 02/27/24   Patel, Donika K, DO    Allergies: Gabapentin    Review of Systems  Gastrointestinal:  Positive for nausea.  Musculoskeletal:  Positive for arthralgias and neck pain.  Neurological:  Positive for headaches.    Updated Vital Signs BP (!) 164/89 (BP Location: Right Arm)   Pulse 80   Temp 98 F (36.7 C) (Oral)   Resp 18   SpO2 98%   Physical Exam Vitals and nursing note reviewed.  Constitutional:      General: He is not in acute distress.    Appearance: He is well-developed. He is not toxic-appearing.  HENT:  Head: Normocephalic and atraumatic.     Right Ear: External ear normal.     Left Ear: External ear normal.  Eyes:     Extraocular Movements: Extraocular movements intact.     Conjunctiva/sclera: Conjunctivae normal.     Pupils: Pupils are equal, round, and reactive to light.  Neck:     Comments: Patient has tenderness to palpation of bilateral trapezius muscles.  Patient denies any bony tenderness of the C-spine.  Patient is neurovascularly intact. Cardiovascular:     Rate and Rhythm: Normal rate and regular rhythm.     Pulses: Normal pulses.     Heart sounds: Normal heart sounds. No murmur heard. Pulmonary:     Effort: Pulmonary effort  is normal. No respiratory distress.     Breath sounds: Normal breath sounds.  Abdominal:     Palpations: Abdomen is soft.     Tenderness: There is no abdominal tenderness.  Musculoskeletal:        General: Tenderness present. No swelling.     Cervical back: Neck supple.     Comments: Patient has tenderness to palpation of the anterior left ribs without ecchymosis or deformity noted on exam.  Skin:    General: Skin is warm and dry.     Capillary Refill: Capillary refill takes less than 2 seconds.  Neurological:     General: No focal deficit present.     Mental Status: He is alert and oriented to person, place, and time.  Psychiatric:        Mood and Affect: Mood normal.     (all labs ordered are listed, but only abnormal results are displayed) Labs Reviewed - No data to display  EKG: None  Radiology: DG Hips Bilat W or Wo Pelvis 5 Views Result Date: 04/23/2024 CLINICAL DATA:  Bilateral hip pain after falling. EXAM: DG HIP (WITH OR WITHOUT PELVIS) 5+V BILAT COMPARISON:  None Available. FINDINGS: The mineralization and alignment are normal. There is no evidence of acute fracture or dislocation. No evidence of femoral head osteonecrosis. There is mild hip joint space narrowing bilaterally. The sacroiliac joints are intact. There is mild lower lumbar spondylosis. The soft tissues appear unremarkable. IMPRESSION: No evidence of acute fracture or dislocation. Mild hip joint space narrowing bilaterally. Electronically Signed   By: Elsie Perone M.D.   On: 04/23/2024 14:16   DG Chest 2 View Result Date: 04/23/2024 CLINICAL DATA:  Left chest pain after falling. EXAM: CHEST - 2 VIEW; LEFT RIBS AND CHEST - 3+ VIEW COMPARISON:  Chest radiographs 11/21/2023 and 10/22/2023. Chest CT 10/03/2023. FINDINGS: Suboptimal inspiration on the PA chest examination. The heart size and mediastinal contours are stable. There is mild bibasilar atelectasis without confluent airspace disease, significant pleural  effusion or pneumothorax. Metallic BB was placed over the area of pain anteriorly on the left. No evidence of underlying acute left-sided rib fracture or focal rib lesion. Stable mild degenerative changes in the spine. IMPRESSION: 1. No evidence of acute left-sided rib fracture or pneumothorax. 2. Mild bibasilar atelectasis. Electronically Signed   By: Elsie Perone M.D.   On: 04/23/2024 14:14   DG Ribs Unilateral W/Chest Left Result Date: 04/23/2024 CLINICAL DATA:  Left chest pain after falling. EXAM: CHEST - 2 VIEW; LEFT RIBS AND CHEST - 3+ VIEW COMPARISON:  Chest radiographs 11/21/2023 and 10/22/2023. Chest CT 10/03/2023. FINDINGS: Suboptimal inspiration on the PA chest examination. The heart size and mediastinal contours are stable. There is mild bibasilar atelectasis without confluent airspace disease, significant pleural effusion  or pneumothorax. Metallic BB was placed over the area of pain anteriorly on the left. No evidence of underlying acute left-sided rib fracture or focal rib lesion. Stable mild degenerative changes in the spine. IMPRESSION: 1. No evidence of acute left-sided rib fracture or pneumothorax. 2. Mild bibasilar atelectasis. Electronically Signed   By: Elsie Perone M.D.   On: 04/23/2024 14:14   CT Head Wo Contrast Result Date: 04/23/2024 EXAM: CT HEAD AND CERVICAL SPINE 04/23/2024 01:38:38 PM TECHNIQUE: CT of the head and cervical spine was performed without the administration of intravenous contrast. Multiplanar reformatted images are provided for review. Automated exposure control, iterative reconstruction, and/or weight based adjustment of the mA/kV was utilized to reduce the radiation dose to as low as reasonably achievable. COMPARISON: CT head 03/02/2024. CLINICAL HISTORY: Head trauma, minor (Age >= 65y). Head trauma, minor (Age >= 65 years). FINDINGS: CT HEAD BRAIN AND VENTRICLES: No acute intracranial hemorrhage. No mass effect or midline shift. No abnormal extra-axial fluid  collection. No evidence of acute infarct. No hydrocephalus. ORBITS: No acute abnormality. SINUSES AND MASTOIDS: No acute abnormality. SOFT TISSUES AND SKULL: No acute skull fracture. No acute soft tissue abnormality. CT CERVICAL SPINE BONES AND ALIGNMENT: Normal alignment of the cervical spine. Vertebral body heights are well preserved. No signs of acute fracture or subluxation. DEGENERATIVE CHANGES: Disc space narrowing and endplate spurring noted at C4-C5, C5-C6, and C6-C7. SOFT TISSUES: No prevertebral soft tissue swelling. IMPRESSION: 1. No acute intracranial abnormality. 2. No acute fracture or traumatic malalignment of the cervical spine. Electronically signed by: Waddell Calk MD 04/23/2024 01:50 PM EST RP Workstation: [id number]   CT Cervical Spine Wo Contrast Result Date: 04/23/2024 EXAM: CT HEAD AND CERVICAL SPINE 04/23/2024 01:38:38 PM TECHNIQUE: CT of the head and cervical spine was performed without the administration of intravenous contrast. Multiplanar reformatted images are provided for review. Automated exposure control, iterative reconstruction, and/or weight based adjustment of the mA/kV was utilized to reduce the radiation dose to as low as reasonably achievable. COMPARISON: CT head 03/02/2024. CLINICAL HISTORY: Head trauma, minor (Age >= 65y). Head trauma, minor (Age >= 65 years). FINDINGS: CT HEAD BRAIN AND VENTRICLES: No acute intracranial hemorrhage. No mass effect or midline shift. No abnormal extra-axial fluid collection. No evidence of acute infarct. No hydrocephalus. ORBITS: No acute abnormality. SINUSES AND MASTOIDS: No acute abnormality. SOFT TISSUES AND SKULL: No acute skull fracture. No acute soft tissue abnormality. CT CERVICAL SPINE BONES AND ALIGNMENT: Normal alignment of the cervical spine. Vertebral body heights are well preserved. No signs of acute fracture or subluxation. DEGENERATIVE CHANGES: Disc space narrowing and endplate spurring noted at C4-C5, C5-C6, and C6-C7. SOFT  TISSUES: No prevertebral soft tissue swelling. IMPRESSION: 1. No acute intracranial abnormality. 2. No acute fracture or traumatic malalignment of the cervical spine. Electronically signed by: Waddell Calk MD 04/23/2024 01:50 PM EST RP Workstation: [id number]     Procedures   Medications Ordered in the ED  lidocaine  (LIDODERM ) 5 % 3 patch (3 patches Transdermal Patch Applied 04/23/24 1628)  ketorolac  (TORADOL ) 30 MG/ML injection 15 mg (15 mg Intramuscular Given 04/23/24 1627)                                    Medical Decision Making  This patient presents to the ED for concern of fall differential diagnosis includes minor head injury, brain bleed, C-spine injury, musculoskeletal pain, cauda equina, spinal cord injury    Additional  history obtained   Additional history obtained from Electronic Medical Record External records from outside source obtained and reviewed including neurology notes   Imaging Studies ordered:  I ordered imaging studies including CT head and C-spine Noncon I independently visualized and interpreted imaging which showed no acute intracranial abnormality.  No acute fracture or traumatic malalignment of the cervical spine. I agree with the radiologist interpretation Chest x-ray: Mild bibasilar atelectasis Left rib x-ray: No evidence of acute left sided rib fracture or pneumothorax. Bilateral hip x-rays: No evidence of acute fracture or dislocation.   Medicines ordered and prescription drug management:  I ordered medication including Lidoderm  patches and Toradol     I have reviewed the patients home medicines and have made adjustments as needed   Problem List / ED Course:  Considered for admission or further workup however patient's vital signs, physical exam, and imaging are reassuring.  Patient has no red flag signs or symptoms concerning for spinal cord injury or cauda equina syndrome.  Patient symptoms likely due to musculoskeletal pain and minor  head injury.  Patient advised to alternate Tylenol /Motrin  as needed for pain.  Patient given short course of muscle relaxers outpatient.  Patient may also ice affected area.  Patient given return precautions.  I feel patient safe for discharge at this time.      Final diagnoses:  Minor head injury, initial encounter  Musculoskeletal pain    ED Discharge Orders          Ordered    metaxalone  (SKELAXIN ) 800 MG tablet  3 times daily        04/23/24 1626               Francis Ileana SAILOR, PA-C 04/23/24 1630    Neysa Caron PARAS, DO 04/23/24 2110  "

## 2024-04-25 ENCOUNTER — Encounter: Payer: Self-pay | Admitting: Internal Medicine

## 2024-04-25 ENCOUNTER — Ambulatory Visit (INDEPENDENT_AMBULATORY_CARE_PROVIDER_SITE_OTHER): Admitting: Internal Medicine

## 2024-04-25 VITALS — BP 132/80 | HR 71 | Temp 98.1°F | Ht 69.0 in | Wt 262.0 lb

## 2024-04-25 DIAGNOSIS — N1832 Chronic kidney disease, stage 3b: Secondary | ICD-10-CM | POA: Diagnosis not present

## 2024-04-25 DIAGNOSIS — S20212D Contusion of left front wall of thorax, subsequent encounter: Secondary | ICD-10-CM

## 2024-04-25 DIAGNOSIS — I10 Essential (primary) hypertension: Secondary | ICD-10-CM | POA: Diagnosis not present

## 2024-04-25 DIAGNOSIS — G8929 Other chronic pain: Secondary | ICD-10-CM

## 2024-04-25 DIAGNOSIS — M545 Low back pain, unspecified: Secondary | ICD-10-CM | POA: Diagnosis not present

## 2024-04-25 DIAGNOSIS — S20212A Contusion of left front wall of thorax, initial encounter: Secondary | ICD-10-CM | POA: Insufficient documentation

## 2024-04-25 MED ORDER — TRAMADOL HCL 50 MG PO TABS: 50.0000 mg | ORAL_TABLET | Freq: Four times a day (QID) | ORAL | 0 refills | Status: AC | PRN

## 2024-04-25 MED ORDER — IBUPROFEN 800 MG PO TABS: 800.0000 mg | ORAL_TABLET | Freq: Four times a day (QID) | ORAL | 1 refills | Status: AC | PRN

## 2024-04-25 NOTE — Assessment & Plan Note (Signed)
 Ckd3b  Lab Results  Component Value Date   CREATININE 1.05 06/01/2023   Stable overall, cont to avoid nephrotoxins

## 2024-04-25 NOTE — Assessment & Plan Note (Signed)
 With at least mod tender sore and visible swelling to the left breast and tender rib below - for tramadol  50 mg prn limited rx, and work note signed to return to work Feb 10

## 2024-04-25 NOTE — Assessment & Plan Note (Signed)
 Chronic stable after fall - for ibuprofen  refill prn

## 2024-04-25 NOTE — Patient Instructions (Signed)
 You are given the work form signed to be out of work to Feb 10  Please take all new medication as prescribed - the tramadol  as needed  You will be contacted by phone if any changes need to be made immediately.  Otherwise, you will receive a letter about your results with an explanation, but please check with MyChart first.  Please continue all other medications as before, and refills have been done if requested.  Please have the pharmacy call with any other refills you may need.  Please keep your appointments with your specialists as you may have planned

## 2024-04-25 NOTE — Assessment & Plan Note (Signed)
 BP Readings from Last 3 Encounters:  04/25/24 132/80  04/23/24 (!) 164/89  03/19/24 124/80   Stable, pt to continue medical treatment hyzaar 100 12.5 qd

## 2024-04-25 NOTE — Progress Notes (Signed)
 Patient ID: Ruben West, male   DOB: 28-Feb-1956, 69 y.o.   MRN: [id number]        Chief Complaint: follow up left chest wall contusion, htn, lbp, ckd3b       HPI:  Ruben West is a 69 y.o. male here after an unfortunate fall on the ice 4 days ago, seen in ED with left sided chest pain and neck pain as he seemed to fall to the left side and may have gotten the arm shoved in to the chest wall.  Fortunately imaging showed no new fractures including ribs.  Has been using a muscle relaxer but not working.  Pain still 8/10 and with visible soft tissue swelling of the left breast and anterolat rib just below.  Pt continues to have recurring LBP without change in severity, bowel or bladder change, fever, wt loss,  worsening LE pain/numbness/weakness, gait change or falls, asking for ibuprofen  refill as well.   Pt has colonscopy soon feb 2026, and f/u eye appt mar 9.  Needs work form signed for out of work until Feb 10.         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 His smoking use included cigars. He has never used smokeless tobacco. He reports that he does not currently use alcohol after a past usage of about 1.0 standard drink of alcohol per week. He reports that he does not use drugs. family history includes Emphysema in his father; Healthy in his daughter; Heart attack in his mother; Seizures in his grandchild and mother. Allergies[1] Medications Ordered Prior to Encounter[2]      ROS:  All others reviewed and negative.  Objective        PE:  BP 132/80 (BP Location: Left Arm, Patient Position: Sitting, Cuff Size: Normal)   Pulse 71   Temp 98.1 F (36.7 C) (Oral)   Ht 5' 9 (1.753 m)   Wt 262 lb (118.8 kg)   SpO2 98%   BMI 38.69 kg/m                 Constitutional: Pt appears in NAD               HENT: Head: NCAT.  Right Ear: External ear normal.                 Left Ear: External ear normal.                Eyes: . Pupils are equal, round, and reactive to light. Conjunctivae and EOM are normal               Nose: without d/c or deformity               Neck: Neck supple. Gross normal ROM               Cardiovascular: Normal rate and regular rhythm.                 Pulmonary/Chest: Effort normal and breath sounds without rales or wheezing.                Abd:  Soft, NT, ND, + BS, no organomegaly               Neurological: Pt is alert. At baseline orientation, motor grossly intact, also has mild to mod non discrete swelling tenderness and swelling of the left breast and rib below without bruising               Skin: Skin is warm. No rashes, no other new lesions, LE edema - none               Psychiatric: Pt behavior is normal without agitation   Micro: none  Cardiac tracings I have personally interpreted today:  none  Pertinent Radiological findings (summarize): none   Lab Results  Component Value Date   WBC 7.3  06/01/2023   HGB 16.2 06/01/2023   HCT 48.0 06/01/2023   PLT 176.0 06/01/2023   GLUCOSE 108 (H) 06/01/2023   CHOL 147 06/01/2023   TRIG 164.0 (H) 06/01/2023   HDL 47.30 06/01/2023   LDLDIRECT 48.0 06/21/2022   LDLCALC 67 06/01/2023   ALT 32 06/01/2023   AST 20 06/01/2023   NA 141 06/01/2023   K 4.5 06/01/2023   CL 104 06/01/2023   CREATININE 1.05 06/01/2023   BUN 19 06/01/2023   CO2 31 06/01/2023   TSH 3.16 06/01/2023   PSA 0.35 06/01/2023   HGBA1C 5.6 06/01/2023   MICROALBUR <0.7 06/01/2023   Assessment/Plan:  [name] is a 69 y.o. White or Caucasian [1] male with  has a past medical history of ALLERGIC RHINITIS (11/23/2006), Back pain, ERECTILE DYSFUNCTION, ORGANIC (04/04/2007), FATTY LIVER DISEASE (05/09/2008), GERD (04/04/2007), History of kidney stones, Hypercholesteremia, HYPERGLYCEMIA (04/04/2007), HYPERTENSION (11/23/2006), Migraines, NASH (nonalcoholic steatohepatitis), Other testicular hypofunction (05/19/2007), PUD (peptic ulcer disease), and Sleep apnea.  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  [name], MD 04/25/2024 10:19 AM [facility] [place]  Primary Care - [facility]    [1]  Allergies Allergen Reactions   Gabapentin Nausea And Vomiting  [2]  Current Outpatient Medications on File  Prior to Visit  Medication Sig Dispense Refill   VEVYE 0.1 % SOLN      albuterol  (VENTOLIN  HFA) 108 (90 Base) MCG/ACT inhaler Inhale 1-2 puffs into the lungs every 6 (six) hours as needed. 1 each 0   aspirin  EC 81 MG tablet Take 81 mg by mouth every morning.     Cholecalciferol (VITAMIN D -3) 25 MCG (1000 UT) CAPS Take 1 capsule by mouth daily.     clonazePAM  (KLONOPIN ) 0.5 MG tablet Take 1 tablet (0.5 mg total) by mouth 2 (two) times daily as needed for anxiety. (Patient not taking: Reported on 04/25/2024) 30 tablet 1   losartan -hydrochlorothiazide  (HYZAAR) 100-12.5 MG tablet TAKE 1 TABLET BY MOUTH EVERY DAY 90 tablet 3   lovastatin  (MEVACOR ) 40 MG tablet TAKE 1 TABLET BY MOUTH EVERY DAY 90 tablet 2   metaxalone  (SKELAXIN ) 800 MG tablet Take 1 tablet (800 mg total) by mouth 3 (three) times daily. 21 tablet 0   ondansetron  (ZOFRAN  ODT) 4 MG disintegrating tablet Take 1 tablet (4 mg total) by mouth every 8 (eight) hours as needed for nausea or vomiting. 20 tablet 1   pioglitazone  (ACTOS ) 15 MG tablet TAKE 1 TABLET (15 MG TOTAL) BY MOUTH DAILY. 90 tablet 2   POTASSIUM PO Take by mouth. (Patient taking differently: Take by mouth. As Needed)     Rimegepant Sulfate (NURTEC) 75 MG TBDP TAKE 1 TABLET BY MOUTH AS NEEDED FOR MIGRAINE WITHIN FIRST 15 MIN 10 tablet 11   No current facility-administered medications on file prior to visit.

## 2024-04-30 ENCOUNTER — Ambulatory Visit: Admitting: Emergency Medicine

## 2024-05-04 ENCOUNTER — Emergency Department (HOSPITAL_BASED_OUTPATIENT_CLINIC_OR_DEPARTMENT_OTHER)

## 2024-05-04 ENCOUNTER — Emergency Department (HOSPITAL_BASED_OUTPATIENT_CLINIC_OR_DEPARTMENT_OTHER)
Admission: EM | Admit: 2024-05-04 | Discharge: 2024-05-04 | Disposition: A | Discharge status: Home / Self Care | Source: Home / Self Care | Attending: Emergency Medicine | Admitting: Emergency Medicine

## 2024-05-04 ENCOUNTER — Ambulatory Visit: Payer: Self-pay

## 2024-05-04 ENCOUNTER — Encounter (HOSPITAL_BASED_OUTPATIENT_CLINIC_OR_DEPARTMENT_OTHER): Payer: Self-pay

## 2024-05-04 ENCOUNTER — Other Ambulatory Visit: Payer: Self-pay

## 2024-05-04 DIAGNOSIS — S2242XA Multiple fractures of ribs, left side, initial encounter for closed fracture: Secondary | ICD-10-CM

## 2024-05-04 LAB — CBC
HCT: 45.5 % (ref 39.0–52.0)
Hemoglobin: 16 g/dL (ref 13.0–17.0)
MCH: 30.3 pg (ref 26.0–34.0)
MCHC: 35.2 g/dL (ref 30.0–36.0)
MCV: 86.2 fL (ref 80.0–100.0)
Platelets: 207 10*3/uL (ref 150–400)
RBC: 5.28 MIL/uL (ref 4.22–5.81)
RDW: 13 % (ref 11.5–15.5)
WBC: 8.9 10*3/uL (ref 4.0–10.5)
nRBC: 0 % (ref 0.0–0.2)

## 2024-05-04 LAB — BASIC METABOLIC PANEL WITH GFR
Anion gap: 11 (ref 5–15)
BUN: 16 mg/dL (ref 8–23)
CO2: 26 mmol/L (ref 22–32)
Calcium: 9.7 mg/dL (ref 8.9–10.3)
Chloride: 103 mmol/L (ref 98–111)
Creatinine, Ser: 1.13 mg/dL (ref 0.61–1.24)
GFR, Estimated: >60 mL/min
Glucose, Bld: 100 mg/dL — ABNORMAL HIGH (ref 70–99)
Potassium: 4.2 mmol/L (ref 3.5–5.1)
Sodium: 140 mmol/L (ref 135–145)

## 2024-05-04 LAB — TROPONIN T, HIGH SENSITIVITY: Troponin T High Sensitivity: 9 ng/L (ref 0–19)

## 2024-05-04 MED ORDER — IOHEXOL 350 MG/ML SOLN
100.0000 mL | Freq: Once | INTRAVENOUS | Status: AC | PRN
  Administered 2024-05-04: 100 mL via INTRAVENOUS

## 2024-05-04 MED ORDER — HYDROCODONE-ACETAMINOPHEN 5-325 MG PO TABS: 1.0000 | ORAL_TABLET | Freq: Four times a day (QID) | ORAL | 0 refills | Status: AC | PRN

## 2024-05-04 MED ORDER — KETOROLAC TROMETHAMINE 15 MG/ML IJ SOLN
15.0000 mg | Freq: Once | INTRAMUSCULAR | Status: AC
  Administered 2024-05-04: 15 mg via INTRAVENOUS
  Filled 2024-05-04: qty 1

## 2024-05-04 MED ORDER — IBUPROFEN 800 MG PO TABS: 800.0000 mg | ORAL_TABLET | Freq: Three times a day (TID) | ORAL | 0 refills | Status: AC

## 2024-05-04 NOTE — ED Provider Notes (Signed)
 " [facility] Provider Note   CSN: [id number] Arrival date & time: 05/04/24  1441     Patient presents with: Chest Pain   Ruben West is a 69 y.o. male.   HPI Patient fell 04/23/24 and fell onto his back and then his side.  He was seen at that time and had CT imaging of his head plain film chest x-ray.  There were no acute findings at that time patient was prescribed ibuprofen  for pain.  He reports he continues to have a lot of pain on his left anterior chest that is limiting what he can do.  He reports that he has got pain with deep inspiration pain with movement and cannot get comfortable at night.  No fever no productive cough.    Prior to Admission medications  Medication Sig Start Date End Date Taking? Authorizing Provider  HYDROcodone -acetaminophen  (NORCO/VICODIN) 5-325 MG tablet Take 1-2 tablets by mouth every 6 (six) hours as needed. 05/04/24  Yes Armenta Canning, MD  ibuprofen  (ADVIL ) 800 MG tablet Take 1 tablet (800 mg total) by mouth 3 (three) times daily. 05/04/24  Yes Armenta Canning, MD  albuterol  (VENTOLIN  HFA) 108 (90 Base) MCG/ACT inhaler Inhale 1-2 puffs into the lungs every 6 (six) hours as needed. 10/22/23   Mayer, Jodi R, NP  aspirin  EC 81 MG tablet Take 81 mg by mouth every morning.    [provider]  Cholecalciferol (VITAMIN D -3) 25 MCG (1000 UT) CAPS Take 1 capsule by mouth daily.    [provider]  clonazePAM  (KLONOPIN ) 0.5 MG tablet Take 1 tablet (0.5 mg total) by mouth 2 (two) times daily as needed for anxiety. Patient not taking: Reported on 04/25/2024 02/27/24   Norleen Lynwood ORN, MD  ibuprofen  (ADVIL ) 800 MG tablet Take 1 tablet (800 mg total) by mouth every 6 (six) hours as needed for mild pain (pain score 1-3) or moderate pain (pain score 4-6). 04/25/24   Norleen Lynwood ORN, MD  losartan -hydrochlorothiazide  [facility]) 100-12.5 MG tablet TAKE 1 TABLET BY MOUTH EVERY DAY 05/26/23   Norleen Lynwood ORN, MD  lovastatin   (MEVACOR ) 40 MG tablet TAKE 1 TABLET BY MOUTH EVERY DAY 08/23/23   Norleen Lynwood ORN, MD  metaxalone  (SKELAXIN ) 800 MG tablet Take 1 tablet (800 mg total) by mouth 3 (three) times daily. 04/23/24   Francis Ileana SAILOR, PA-C  ondansetron  (ZOFRAN  ODT) 4 MG disintegrating tablet Take 1 tablet (4 mg total) by mouth every 8 (eight) hours as needed for nausea or vomiting. 02/22/23   Patel, Donika K, DO  pioglitazone  (ACTOS ) 15 MG tablet TAKE 1 TABLET (15 MG TOTAL) BY MOUTH DAILY. 04/20/24   John, James W, MD  POTASSIUM PO Take by mouth. Patient taking differently: Take by mouth. As Needed    [provider]  Rimegepant Sulfate (NURTEC) 75 MG TBDP TAKE 1 TABLET BY MOUTH AS NEEDED FOR MIGRAINE WITHIN FIRST 15 MIN 02/27/24   Patel, Donika K, DO  traMADol  (ULTRAM ) 50 MG tablet Take 1 tablet (50 mg total) by mouth every 6 (six) hours as needed. 04/25/24   Norleen Lynwood ORN, MD  VEVYE 0.1 % SOLN  04/10/24   [provider]    Allergies: Gabapentin    Review of Systems  Updated Vital Signs BP 139/88 (BP Location: Right Arm)   Pulse 87   Temp 97.9 F (36.6 C)   Resp 16   SpO2 98%   Physical Exam Constitutional:  Comments: Patient is alert and nontoxic.  No respiratory distress.  HENT:     Head: Normocephalic and atraumatic.     Mouth/Throat:     Pharynx: Oropharynx is clear.  Cardiovascular:     Rate and Rhythm: Normal rate and regular rhythm.  Pulmonary:     Effort: Pulmonary effort is normal.     Breath sounds: Normal breath sounds.     Comments: Patient has reproducible chest wall pain to the left anterior chest.  No crepitus, no visible contusions or abrasions. Abdominal:     Comments: Abdomen soft without guarding.  Patient does endorse discomfort to palpation in the left upper quadrant.  No abdominal wall contusions visible.  Musculoskeletal:        General: No swelling or tenderness. Normal range of motion.     Right lower leg: No edema.     Left lower leg: No edema.  Skin:     General: Skin is warm and dry.  Neurological:     General: No focal deficit present.     Mental Status: He is oriented to person, place, and time.     Motor: No weakness.     Coordination: Coordination normal.  Psychiatric:        Mood and Affect: Mood normal.     (all labs ordered are listed, but only abnormal results are displayed) Labs Reviewed  BASIC METABOLIC PANEL WITH GFR - Abnormal; Notable for the following components:      Result Value   Glucose, Bld 100 (*)    All other components within normal limits  CBC  TROPONIN T, HIGH SENSITIVITY  TROPONIN T, HIGH SENSITIVITY    EKG: EKG Interpretation Date/Time:  Friday May 04 2024 15:08:51 EST Ventricular Rate:  84 PR Interval:  179 QRS Duration:  91 QT Interval:  364 QTC Calculation: 431 R Axis:   75  Text Interpretation: Sinus rhythm Low voltage, precordial leads Probable anterolateral infarct, old Confirmed by Ula Barter [phone]) on 05/04/2024 3:10:55 PM  Radiology: CT ABDOMEN PELVIS W CONTRAST Result Date: 05/04/2024 EXAM: CT ABDOMEN AND PELVIS WITH CONTRAST 05/04/2024 08:51:04 PM TECHNIQUE: CT of the abdomen and pelvis was performed with the administration of 100 mL iohexol  (OMNIPAQUE ) 350 MG/ML injection. Multiplanar reformatted images are provided for review. Automated exposure control, iterative reconstruction, and/or weight-based adjustment of the mA/kV was utilized to reduce the radiation dose to as low as reasonably achievable. COMPARISON: None available. CLINICAL HISTORY: Fall 1 month ago with left-sided chest and abdominal pain. FINDINGS: LIVER: The liver is unremarkable. GALLBLADDER AND BILE DUCTS: Gallbladder is unremarkable. No biliary ductal dilatation. SPLEEN: No acute abnormality. PANCREAS: Duodenal diverticulum was noted adjacent to the pancreatic head. The pancreas is otherwise within normal limits. ADRENAL GLANDS: No acute abnormality. KIDNEYS, URETERS AND BLADDER: Kidneys demonstrate no renal calculi or  obstructive changes. No perinephric or periureteral stranding. The bladder is partially distended. GI AND BOWEL: Stomach and small bowel are within normal limits. No obstructive or inflammatory changes of the colon are seen. Minimal diverticular changes noted. The appendix is unremarkable. There is no bowel obstruction. PERITONEUM AND RETROPERITONEUM: No ascites. No free air. VASCULATURE: The abdominal aorta demonstrates atherosclerotic calcification. No aneurysmal dilatation is seen. LYMPH NODES: No lymphadenopathy. REPRODUCTIVE ORGANS: The prostate is within normal limits. BONES AND SOFT TISSUES: No acute osseous abnormality. No focal soft tissue abnormality. IMPRESSION: 1. No acute findings in the abdomen or pelvis. Electronically signed by: Oneil Devonshire MD 05/04/2024 08:59 PM EST RP Workstation: [id number]  CT Angio Chest PE W/Cm &/Or Wo Cm Result Date: 05/04/2024 EXAM: CTA of the Chest with contrast for PE 05/04/2024 08:51:04 PM TECHNIQUE: CTA of the chest was performed after the administration of 100 mL of iohexol  (OMNIPAQUE ) 350 MG/ML injection. Multiplanar reformatted images are provided for review. MIP images are provided for review. Automated exposure control, iterative reconstruction, and/or weight based adjustment of the mA/kV was utilized to reduce the radiation dose to as low as reasonably achievable. COMPARISON: Chest x-ray from earlier in the same day. CLINICAL HISTORY: Fall 1 month ago with worsening left-sided chest pain. FINDINGS: PULMONARY ARTERIES: The pulmonary artery shows a normal branching pattern bilaterally. No filling defect to suggest pulmonary embolism is noted. Main pulmonary artery is normal in caliber. MEDIASTINUM: The heart and pericardium demonstrate no acute abnormality. No cardiac enlargement is seen. Mild coronary calcifications are noted. The aorta shows a normal branching pattern. No evidence of dissection is noted. There is no acute abnormality of the thoracic aorta. The  esophagus, as visualized, is within normal limits. A hypodense nodule is noted within the right lobe of the thyroid , measuring 15 mm and stable from a prior exam from 10/03/2023. LYMPH NODES: No hilar or mediastinal adenopathy is noted. LUNGS AND PLEURA: The lungs are well aerated bilaterally. No focal consolidation or pulmonary edema. No sizable effusion or pneumothorax is noted. No parenchymal nodules are seen. UPPER ABDOMEN: The visualized upper abdomen is within normal limits. SOFT TISSUES AND BONES: The bony structures show nondisplaced fractures of the left fifth, sixth, and seventh ribs anteriorly. IMPRESSION: 1. No evidence of pulmonary embolism. 2. Nondisplaced fractures of the left fifth, sixth, and seventh ribs anteriorly. 3. Right thyroid  15 mm hypodense nodule, stable from 10/03/2023, for which non-emergent thyroid  ultrasound is recommended. Electronically signed by: Oneil Devonshire MD 05/04/2024 08:57 PM EST RP Workstation: [id number]   DG Chest 2 View Result Date: 05/04/2024 CLINICAL DATA:  Chest pain after fall last week EXAM: CHEST - 2 VIEW COMPARISON:  April 23, 2024 FINDINGS: The heart size and mediastinal contours are within normal limits. Both lungs are clear. The visualized skeletal structures are unremarkable. IMPRESSION: No active cardiopulmonary disease. Electronically Signed   By: Lynwood Landy Raddle M.D.   On: 05/04/2024 16:05     Procedures   Medications Ordered in the ED  ketorolac  (TORADOL ) 15 MG/ML injection 15 mg (15 mg Intravenous Given 05/04/24 2029)  iohexol  (OMNIPAQUE ) 350 MG/ML injection 100 mL (100 mLs Intravenous Contrast Given 05/04/24 2037)                                    Medical Decision Making Amount and/or Complexity of Data Reviewed Labs: ordered. Radiology: ordered.  Risk Prescription drug management.   Patient presents dolling.  He had a fall approximately 11 days ago.  He is continuing to have a lot of left anterior chest pain that is limiting his  functionality.  Patient had x-ray imaging at that time.  Will proceed with CT imaging to rule out pulmonary contusion\pulmonary embolus\splenic or renal contusion or hematoma.  CT chest abdomen pelvis interpreted by radiology positive for anterior rib fractures left chest wall nondisplaced 5, 6, 7.  Otherwise negative.  CBC normal.  Troponin normal.  Basic metabolic panel normal.  Patient presents as outlined.  Findings are consistent with nondisplaced rib fracture.  Patient is otherwise stable.  He is not having adequate pain control with ibuprofen .  I  counseled the patient on continuing ibuprofen  and adding 1-2 Vicodin as needed every 6 hours.  Patient is counseled to follow-up with his PCP for continued pain management and decisions regarding return to work timing.     Final diagnoses:  Closed fracture of multiple ribs of left side, initial encounter    ED Discharge Orders          Ordered    ibuprofen  (ADVIL ) 800 MG tablet  3 times daily        05/04/24 2143    HYDROcodone -acetaminophen  (NORCO/VICODIN) 5-325 MG tablet  Every 6 hours PRN        05/04/24 2143               Armenta Canning, MD 05/04/24 2154  "

## 2024-05-04 NOTE — Discharge Instructions (Signed)
 1.  Take ibuprofen  every 8 hours with food for pain control.  If you have additional pain you may take 1-2 Vicodin tablets every 6 hours.   2.  Vicodin can cause constipation.  If you tend to get constipated take an over-the-counter stool softener per package instructions such as Colace. 3.  Follow-up with your doctor for recheck.  It can take a number of months before rib fractures are really comfortable again.

## 2024-05-04 NOTE — ED Triage Notes (Signed)
 Reports continued pain from fall 1 month ago. Pain in L side of ribs front and back side. Pain worse when moving. SHOB  States pain is radiating into mid chest since yesterday

## 2024-05-04 NOTE — Telephone Encounter (Signed)
 FYI Only or Action Required?: FYI only for provider: ED advised.  Patient was last seen in primary care on 04/25/2024 by Norleen Lynwood ORN, MD.  Called Nurse Triage reporting Rib Injury.  Symptoms began a week ago.  Interventions attempted: Prescription medications: Tramadol , Rest, hydration, or home remedies, and Ice/heat application.  Symptoms are: gradually worsening.  Triage Disposition: Go to ED Now (Notify PCP)  Patient/caregiver understands and will follow disposition?: Yes    Reason for Disposition  SEVERE chest pain  Answer Assessment - Initial Assessment Questions Patient fell on ice about 2 weeks ago and was seen in ED and in office. Diagnosed with rib bruising to left side. Patient is calling in because he is in severe pain and having laying down. He states that it is a sharp pain and feels a little different compared to when the injury first happened. He has been taking Tramadol , but does not feel like it's been helping at all. He has also been icing the area and reports this takes pain from a 10 to an 8. He also mentions that he was coughing up clear mucus, but notes that it's most recently green. ED advised. Patient also states that he is supposed to back to work next week and is not sure he'll be able to with this pain. He is requesting a doctor's note for work.   1. MECHANISM: How did the injury happen?     Patient fell on ice  2. ONSET: When did the injury happen? (.e.g., minutes, hours, days ago)     1/26  3. LOCATION: Where on the chest is the injury located?     Left side  4. APPEARANCE: What does the injury look like?     Unknown  5. BLEEDING: Is there any bleeding now? If Yes, ask: How long has it been bleeding?     No  6. SEVERITY: Any difficulty with breathing?     Occasional pain with deep breath  7. SIZE: For cuts, bruises, or swelling, ask: How large is it? (e.g., inches or centimeters)     NA  8. PAIN: Is there pain? If Yes, ask:  How bad is the pain? (e.g., Scale 0-10; none, mild, moderate, severe)     Yes, 8/10  9. TETANUS: For any breaks in the skin, ask: When was your last tetanus booster?     NA  10. PREGNANCY: Is there any chance you are pregnant? When was your last menstrual period?       NA  Protocols used: Chest Injury-A-AH  Reason for Triage: Pt is stating he is still in pain with his Left side ribs after falling on the ice. Pt states he is still in pain and worse when laying down. Pt also states it still hurts when he breathes. Pt is also suppose to return back to work in 4 days and does not know how he can do that with the pain still like this.

## 2024-05-07 ENCOUNTER — Encounter

## 2024-05-21 ENCOUNTER — Encounter: Admitting: Gastroenterology

## 2024-06-04 ENCOUNTER — Encounter: Admitting: Internal Medicine

## 2024-07-09 ENCOUNTER — Ambulatory Visit: Admitting: Neurology

## 2024-10-01 ENCOUNTER — Other Ambulatory Visit

## 2024-10-15 ENCOUNTER — Ambulatory Visit: Admitting: Family Medicine
